# Patient Record
Sex: Female | Born: 1935 | Race: White | Hispanic: No | Marital: Married | Smoking: Former smoker
Health system: Southern US, Community
[De-identification: ages and names within clinical notes are randomized; demographics above are authoritative.]

## PROBLEM LIST (undated history)

## (undated) DIAGNOSIS — E785 Hyperlipidemia, unspecified: Secondary | ICD-10-CM

## (undated) DIAGNOSIS — M48061 Spinal stenosis, lumbar region without neurogenic claudication: Secondary | ICD-10-CM

## (undated) DIAGNOSIS — I2510 Atherosclerotic heart disease of native coronary artery without angina pectoris: Secondary | ICD-10-CM

## (undated) DIAGNOSIS — M1990 Unspecified osteoarthritis, unspecified site: Secondary | ICD-10-CM

## (undated) DIAGNOSIS — I5022 Chronic systolic (congestive) heart failure: Secondary | ICD-10-CM

## (undated) DIAGNOSIS — Z950 Presence of cardiac pacemaker: Secondary | ICD-10-CM

## (undated) DIAGNOSIS — I428 Other cardiomyopathies: Secondary | ICD-10-CM

## (undated) DIAGNOSIS — I447 Left bundle-branch block, unspecified: Secondary | ICD-10-CM

## (undated) DIAGNOSIS — I10 Essential (primary) hypertension: Secondary | ICD-10-CM

## (undated) DIAGNOSIS — I429 Cardiomyopathy, unspecified: Secondary | ICD-10-CM

## (undated) HISTORY — PX: APPENDECTOMY: SHX54

## (undated) HISTORY — DX: Unspecified osteoarthritis, unspecified site: M19.90

## (undated) HISTORY — PX: AMPUTATION: SHX166

## (undated) HISTORY — PX: PACEMAKER INSERTION: SHX728

## (undated) HISTORY — DX: Atherosclerotic heart disease of native coronary artery without angina pectoris: I25.10

## (undated) HISTORY — DX: Cardiomyopathy, unspecified: I42.9

## (undated) HISTORY — DX: Hyperlipidemia, unspecified: E78.5

## (undated) HISTORY — DX: Spinal stenosis, lumbar region without neurogenic claudication: M48.061

## (undated) HISTORY — DX: Essential (primary) hypertension: I10

## (undated) HISTORY — DX: Left bundle-branch block, unspecified: I44.7

## (undated) HISTORY — PX: KIDNEY SURGERY: SHX687

## (undated) HISTORY — DX: Chronic systolic (congestive) heart failure: I50.22

## (undated) HISTORY — DX: Other cardiomyopathies: I42.8

## (undated) HISTORY — PX: TONSILLECTOMY: SHX5217

## (undated) HISTORY — PX: CHOLECYSTECTOMY: SHX55

## (undated) HISTORY — DX: Presence of cardiac pacemaker: Z95.0

---

## 2001-05-01 ENCOUNTER — Encounter: Payer: Self-pay | Admitting: Cardiology

## 2001-05-01 ENCOUNTER — Inpatient Hospital Stay (HOSPITAL_COMMUNITY): Admission: EM | Admit: 2001-05-01 | Discharge: 2001-05-04 | Payer: Self-pay | Admitting: Emergency Medicine

## 2001-09-09 ENCOUNTER — Other Ambulatory Visit: Admission: RE | Admit: 2001-09-09 | Discharge: 2001-09-09 | Payer: Self-pay | Admitting: Internal Medicine

## 2002-12-31 ENCOUNTER — Emergency Department (HOSPITAL_COMMUNITY): Admission: EM | Admit: 2002-12-31 | Discharge: 2002-12-31 | Payer: Self-pay

## 2003-04-18 ENCOUNTER — Emergency Department (HOSPITAL_COMMUNITY): Admission: EM | Admit: 2003-04-18 | Discharge: 2003-04-18 | Payer: Self-pay

## 2003-09-26 ENCOUNTER — Emergency Department (HOSPITAL_COMMUNITY): Admission: EM | Admit: 2003-09-26 | Discharge: 2003-09-26 | Payer: Self-pay | Admitting: *Deleted

## 2003-12-16 ENCOUNTER — Encounter: Admission: RE | Admit: 2003-12-16 | Discharge: 2003-12-16 | Payer: Self-pay | Admitting: Internal Medicine

## 2004-01-04 ENCOUNTER — Inpatient Hospital Stay (HOSPITAL_COMMUNITY): Admission: AD | Admit: 2004-01-04 | Discharge: 2004-01-06 | Payer: Self-pay | Admitting: Internal Medicine

## 2004-02-02 HISTORY — PX: HIP FRACTURE SURGERY: SHX118

## 2004-02-20 ENCOUNTER — Other Ambulatory Visit: Payer: Self-pay

## 2004-02-24 ENCOUNTER — Inpatient Hospital Stay (HOSPITAL_COMMUNITY)
Admission: RE | Admit: 2004-02-24 | Discharge: 2004-03-01 | Payer: Self-pay | Admitting: Physical Medicine & Rehabilitation

## 2004-10-02 IMAGING — CR DG CHEST 2V
2 series · 2 of 2 positions shown · non-contrast
Comparison: 12/05/03.

CLINICAL DATA: Chest pain.  Status post pacemaker.
 TWO VIEW CHEST ? 01/06/04

[view not recorded (1 of 2)]
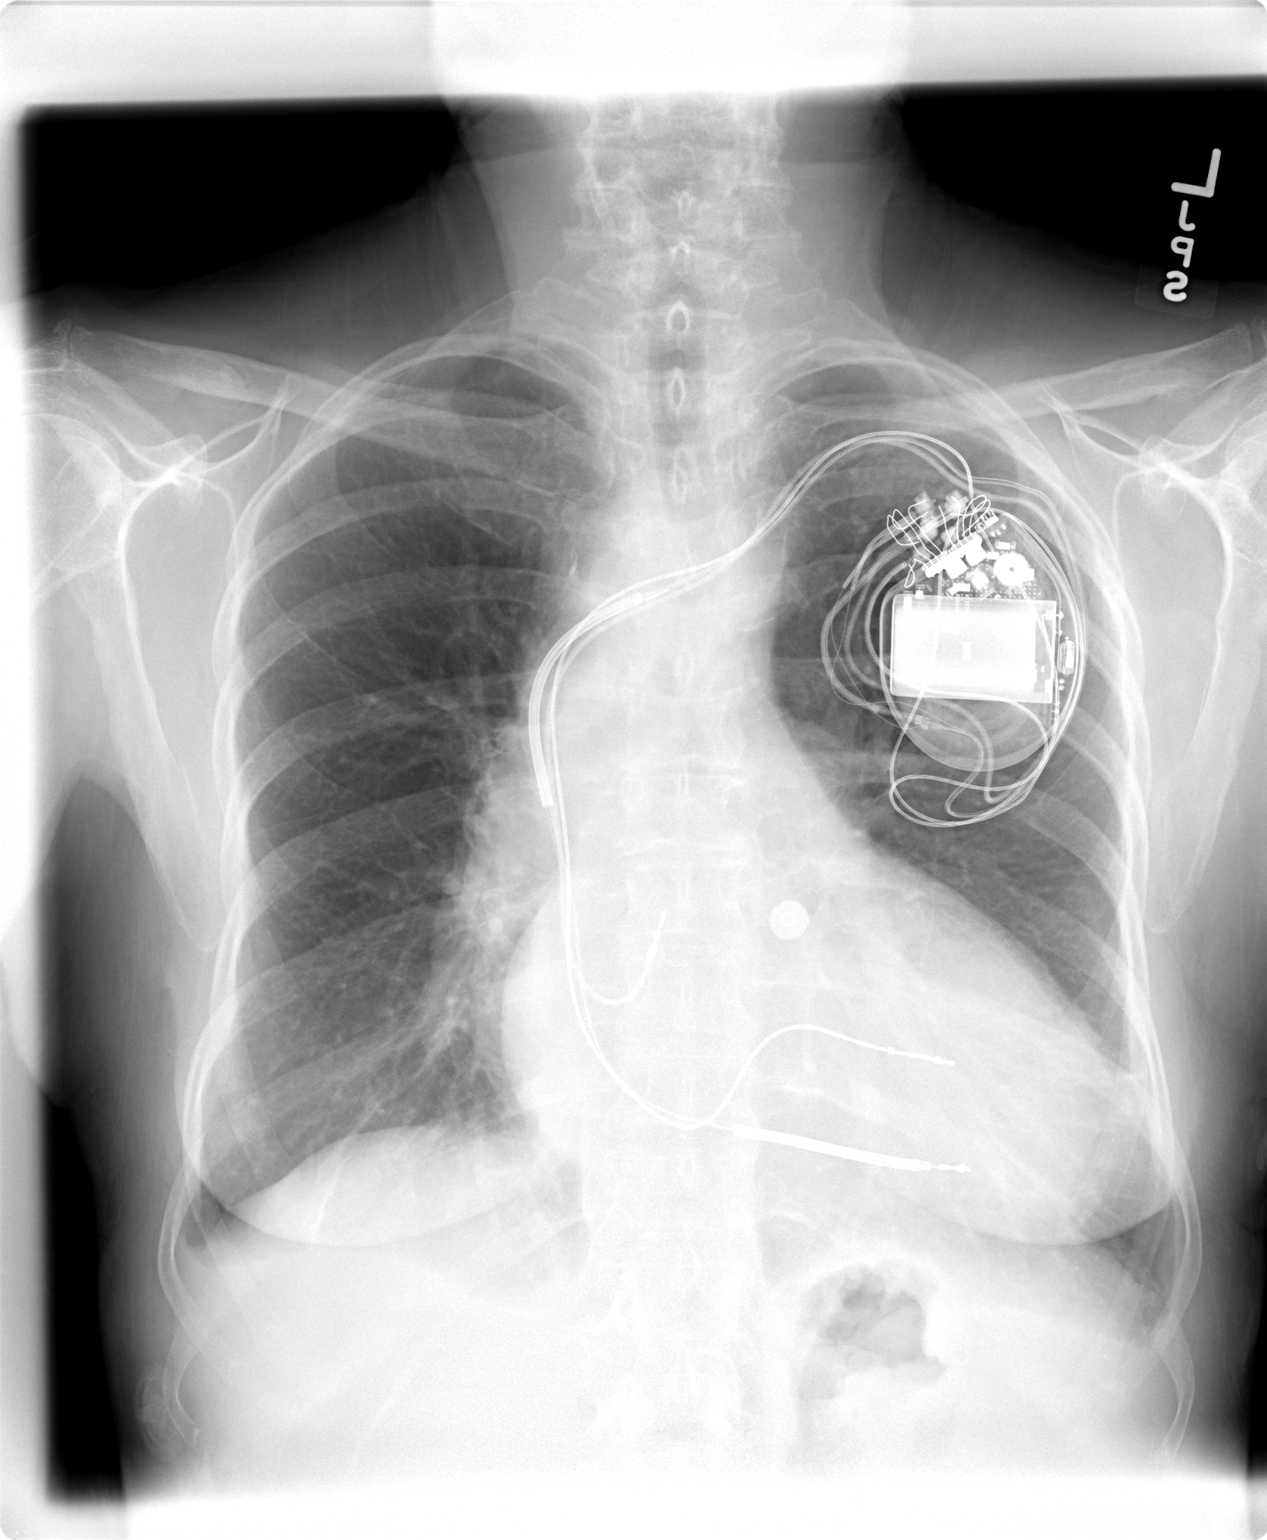

[view not recorded (2 of 2)]
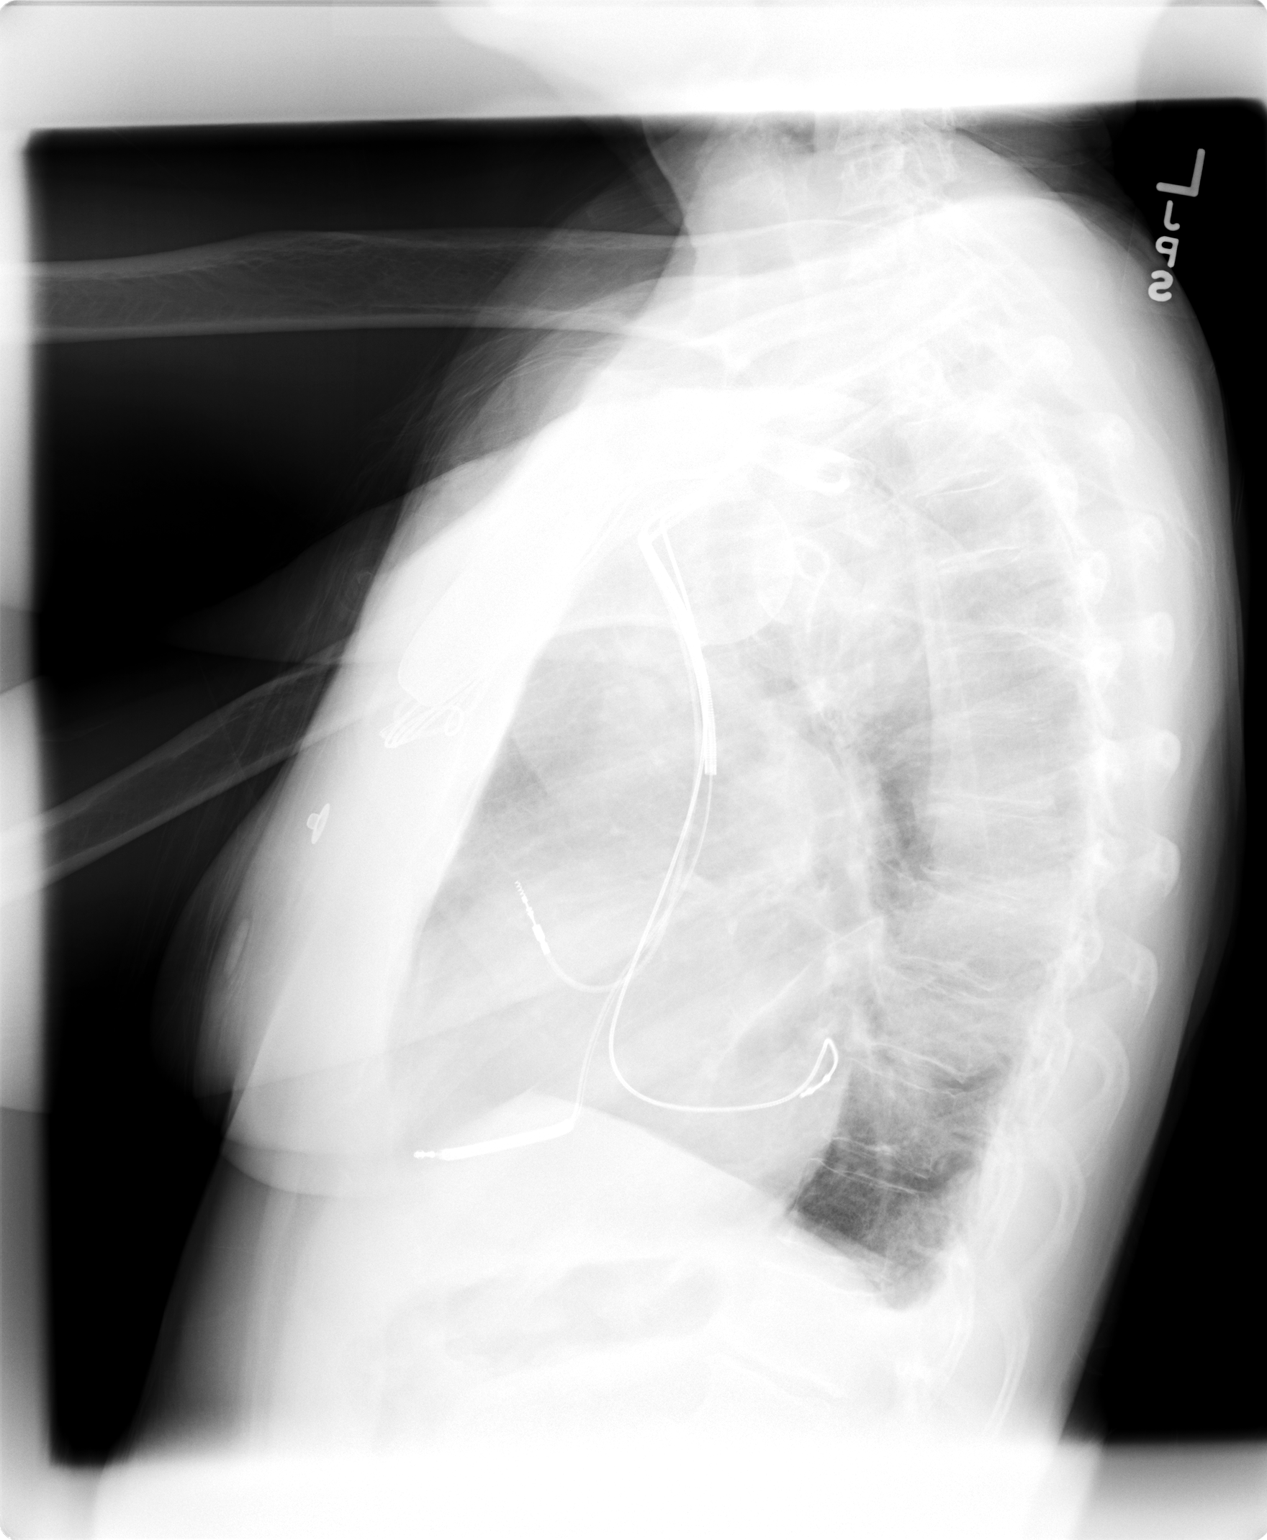

[2 of 2 positions shown; findings below may reference images not displayed]

FINDINGS: Cardiomegaly and left-sided AICD/pacemaker noted with leads overlying the right atrium, right ventricle and coronary sinus.  No evidence of pneumothorax.  Very small bilateral pleural effusions are identified.
IMPRESSION: Stable cardiomegaly and left AICD/pacer.
 Tiny bilateral pleural effusions.

## 2004-12-22 ENCOUNTER — Ambulatory Visit: Payer: Self-pay

## 2005-01-05 ENCOUNTER — Ambulatory Visit: Payer: Self-pay | Admitting: Internal Medicine

## 2005-04-11 ENCOUNTER — Ambulatory Visit: Payer: Self-pay | Admitting: Internal Medicine

## 2005-09-18 ENCOUNTER — Ambulatory Visit: Payer: Self-pay | Admitting: Internal Medicine

## 2005-10-27 ENCOUNTER — Ambulatory Visit: Payer: Self-pay | Admitting: Cardiology

## 2005-12-07 ENCOUNTER — Ambulatory Visit: Payer: Self-pay | Admitting: Internal Medicine

## 2005-12-20 ENCOUNTER — Encounter: Admission: RE | Admit: 2005-12-20 | Discharge: 2005-12-20 | Payer: Self-pay | Admitting: Orthopedic Surgery

## 2005-12-20 ENCOUNTER — Encounter: Payer: Self-pay | Admitting: Internal Medicine

## 2006-01-30 ENCOUNTER — Ambulatory Visit: Payer: Self-pay | Admitting: Internal Medicine

## 2006-03-20 ENCOUNTER — Ambulatory Visit: Payer: Self-pay | Admitting: Internal Medicine

## 2006-04-27 ENCOUNTER — Ambulatory Visit: Payer: Self-pay | Admitting: Cardiology

## 2006-05-04 ENCOUNTER — Ambulatory Visit: Payer: Self-pay

## 2006-06-11 ENCOUNTER — Ambulatory Visit: Payer: Self-pay | Admitting: Cardiology

## 2006-06-21 ENCOUNTER — Ambulatory Visit: Payer: Self-pay | Admitting: Internal Medicine

## 2006-07-27 ENCOUNTER — Ambulatory Visit (HOSPITAL_COMMUNITY): Admission: RE | Admit: 2006-07-27 | Discharge: 2006-07-28 | Payer: Self-pay | Admitting: Orthopedic Surgery

## 2006-09-16 IMAGING — CT CT L SPINE W/ CM
3 of 10 series · 13 of 33 positions shown, 16 images · IV contrast (omnipaque)
Comparison: none

CLINICAL DATA: Low back pain.  Bilateral hip and leg pain.  
LUMBAR MYELOGRAM:
TECHNIQUE: Following informed consent including the risks of pain, infection, bleeding, neurological deficit and headache, sterile preparation of the back and adequate local anesthesia, lumbar puncture was performed by myself at L5-S1 from a left paramedian approach.  Fluid was clear and colorless.  A 22 gauge needle was employed.  I injected 15 cc of Omnipaque 180 into the subarachnoid space.
TECHNIQUE: Multidetector CT imaging of the lumbar spine was performed after intrathecal injection of contrast.  Multiplanar CT image reconstructions were also generated.

[Series 4: recon 3: l spine · axial · 0.27mm/px · z∈[-208,-75]mm · 5 of 332 slices shown, 7 images]
[im 56/332  soft-tissue]
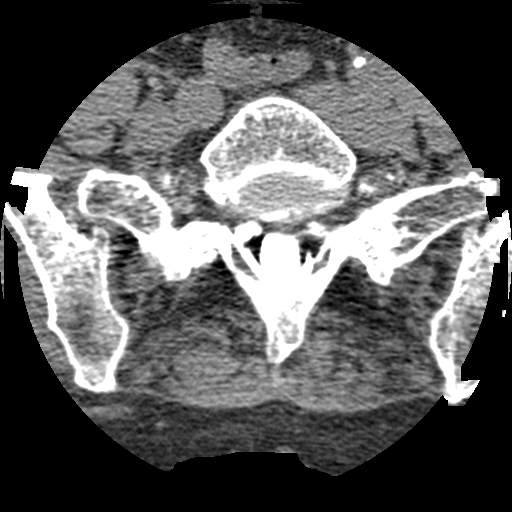
[im 56/332  bone]
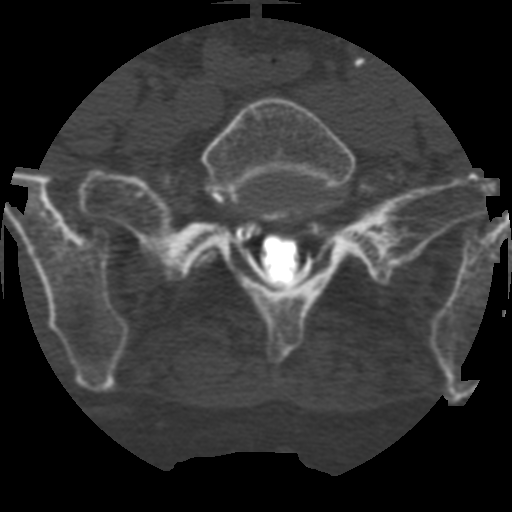
[im 111/332  bone]
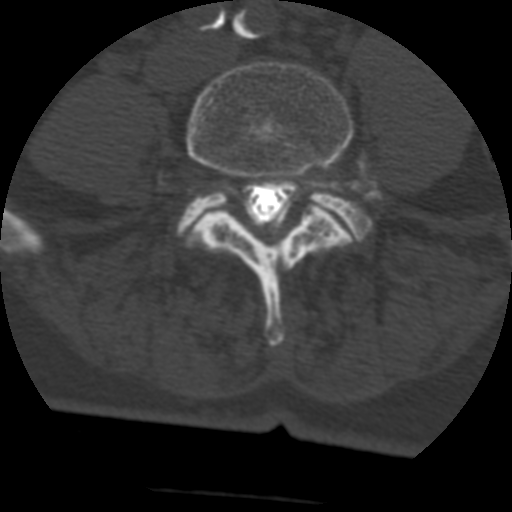
[im 166/332  bone]
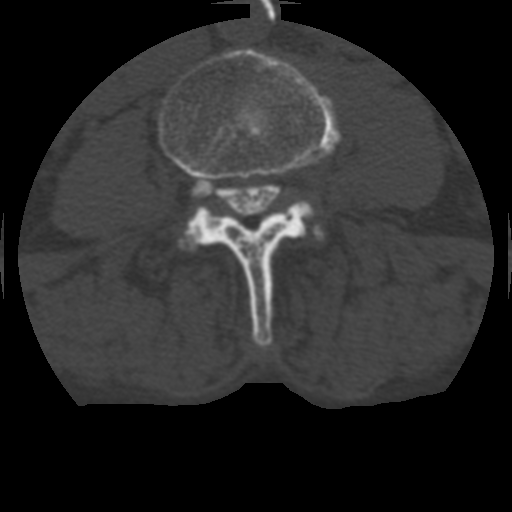
[im 221/332  bone]
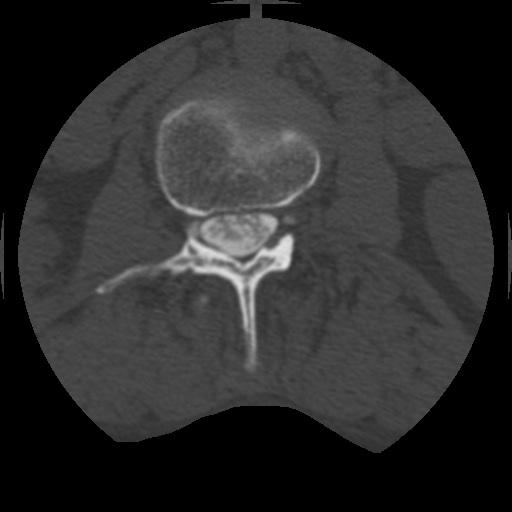
[im 276/332  soft-tissue]
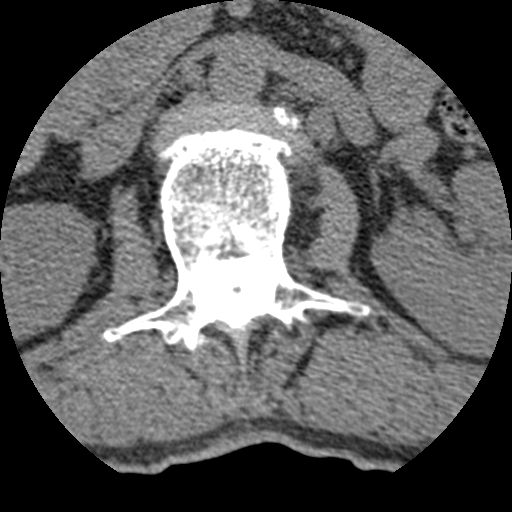
[im 276/332  bone]
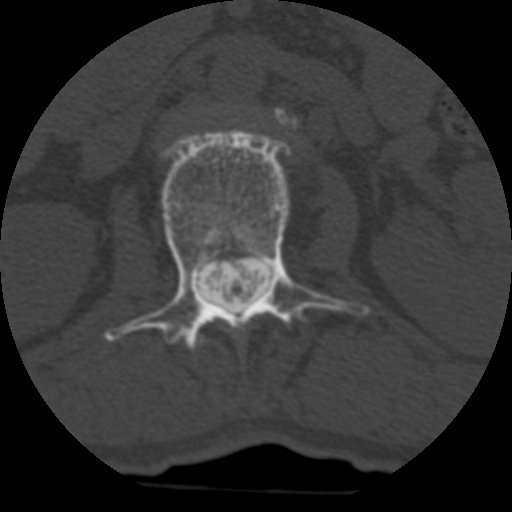

[Series 400: reformatted · sagittal · 0.40mm/px · 5 of 40 slices shown, 6 images (1 of 2)]
[im 14/40  bone]
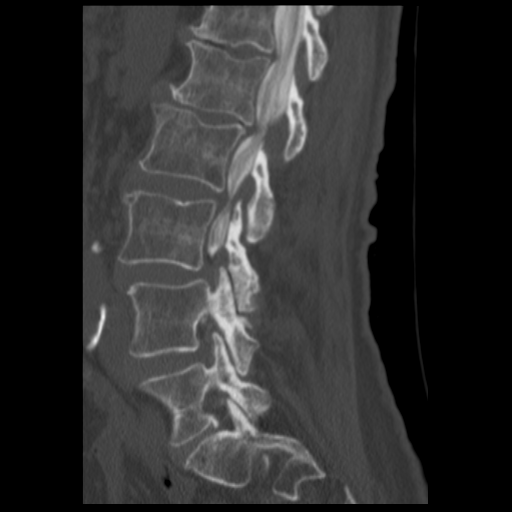
[im 17/40  bone]
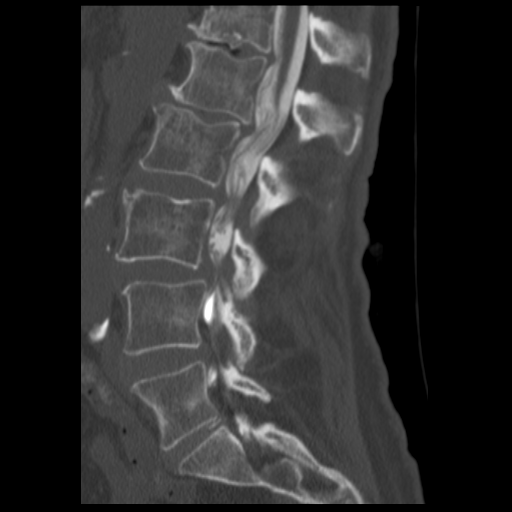
[im 20/40  soft-tissue]
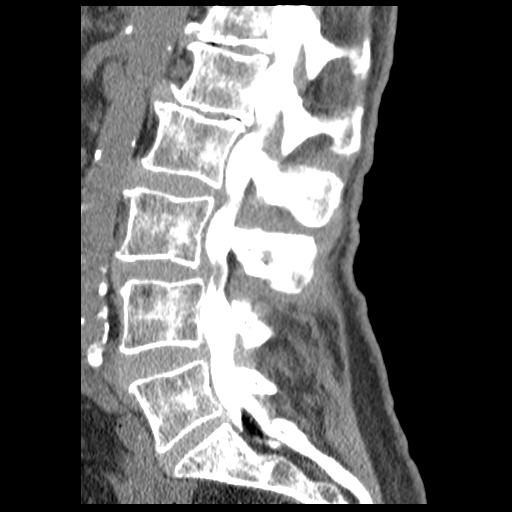
[im 20/40  bone]
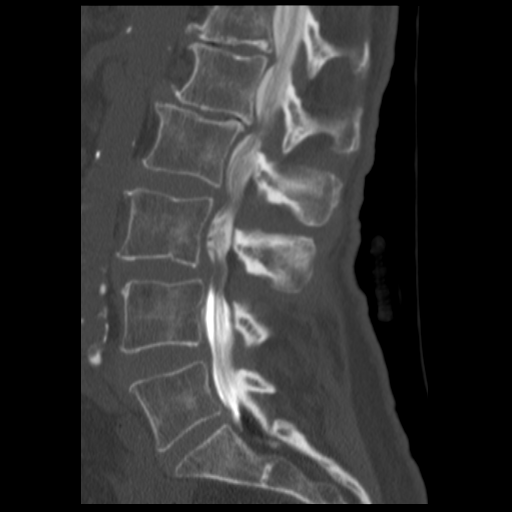
[im 23/40  bone]
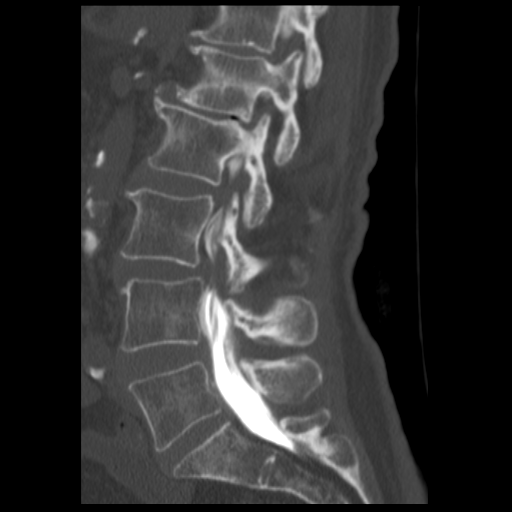
[im 27/40  bone]
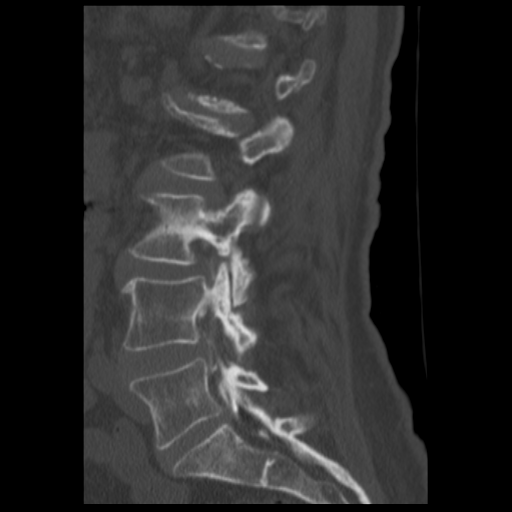

[Series 401: reformatted · coronal · 0.40mm/px · 3 of 40 slices shown (2 of 2)]
[im 8/40  bone]
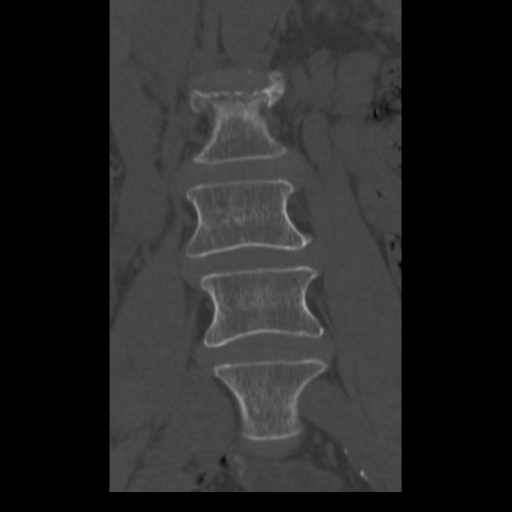
[im 16/40  bone]
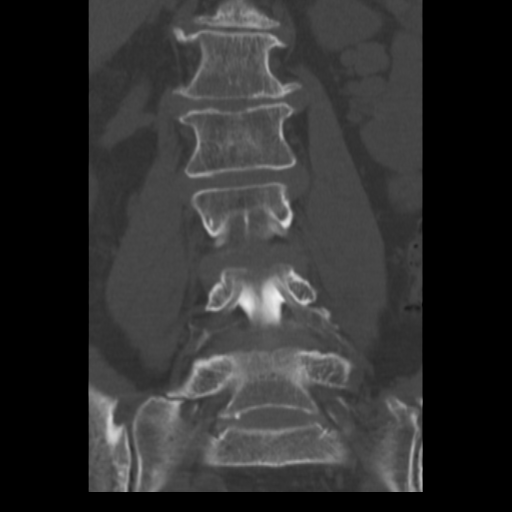
[im 24/40  bone]
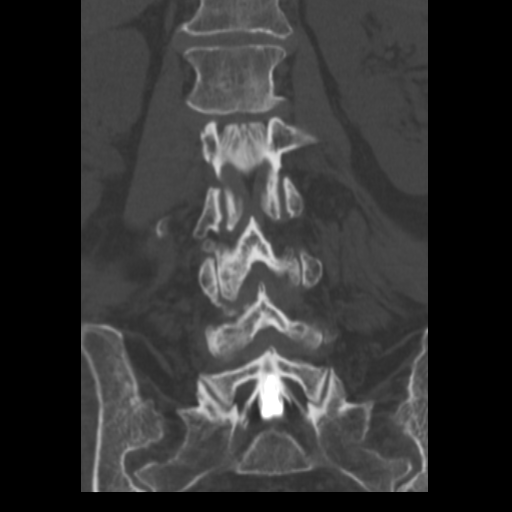

[13 of 33 positions shown; findings below may reference images not displayed]

FINDINGS: AP, lateral and oblique views show significant multilevel stenosis at L1-2, L2-3, L3-4, and L4-5 with waist-like narrowing and bilateral nerve root cut off at each level.  The L5-S1 level is not as severely affected; the S-1 nerve roots appear to fill out fairly normally.  The patient could not cooperate for standing flexion and extension films as she was too weak.  Lateral film with her recumbent did demonstrate 5 mm of anterolisthesis of L-3 on L-4.
IMPRESSION: Multilevel stenosis.
CT LUMBAR SPINE WITH CONTRAST (POST-MYELOGRAM):
FINDINGS: Mild vascular calcification is noted in the aorta without aneurysmal dilatation. 
T12-L1:  Tiny left paracentral protrusion.  No neural encroachment.
L1-2:  Broad based central disc protrusion.  Mild facet arthropathy.  Bilateral L-2 nerve root encroachment. 
L2-3:  Severe multifactorial spinal stenosis secondary to posterior element hypertrophy, short pedicles, and broad based central disc protrusion with fairly severe compression of the thecal sac as well as bilateral L-3 nerve root compression.  There is some foraminal narrowing but no definite L-2 nerve root compromise.
L3-4:  Approximately 5 mm degenerative slip of L-3 forward on L-4.  Advanced posterior element  hypertrophy and short pedicles are present along with a central disc protrusion.  Bilateral L-3 and bilateral L-4 nerve root encroachment are present. 
L4-5:  Mild bilateral L-5 nerve root encroachment due to broad based protrusion and moderate facet disease. 
L5-S1:  Small central protrusion.  Mild facet hypertrophy.  No neural encroachment.
IMPRESSION: 1.  Severe spinal stenosis at L3-4 secondary to 5 mm of degenerative slip, marked posterior element hypertrophy and broad based central disc protrusion along with short pedicles; bilateral L-3 and bilateral L-4 nerve root encroachment are present. 
2.  Similar but less severe stenosis at L2-3 secondary to central disc protrusion, posterior element hypertrophy, and short pedicles with bilateral L-3 nerve root encroachment. 
3.  Advanced disc space narrowing L1-2 with small central protrusion.  Bilateral L-2 nerve root compromise is seen. 
4.  Relatively mild changes at L4-5 with broad based central disc protrusion and facet hypertrophy effacing both L-5 nerve roots.

## 2006-09-16 IMAGING — RF DG MYELOGRAM LUMBAR
13 of 19 series · 13 of 19 positions shown · IV contrast (omnipaque)
Comparison: none

CLINICAL DATA: Low back pain.  Bilateral hip and leg pain.  
LUMBAR MYELOGRAM:
TECHNIQUE: Following informed consent including the risks of pain, infection, bleeding, neurological deficit and headache, sterile preparation of the back and adequate local anesthesia, lumbar puncture was performed by myself at L5-S1 from a left paramedian approach.  Fluid was clear and colorless.  A 22 gauge needle was employed.  I injected 15 cc of Omnipaque 180 into the subarachnoid space.
TECHNIQUE: Multidetector CT imaging of the lumbar spine was performed after intrathecal injection of contrast.  Multiplanar CT image reconstructions were also generated.

[Series 1: myelogram  white · 1 of 1 slices shown (1 of 13)]
[im 1/1]
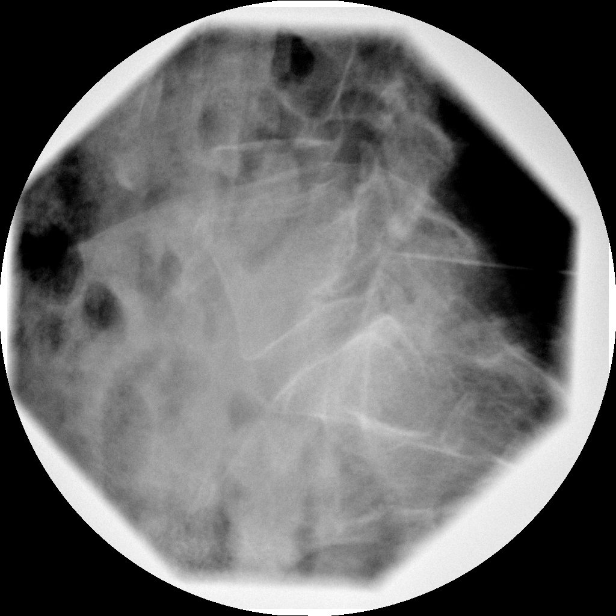

[Series 3: myelogram  white · 1 of 1 slices shown (2 of 13)]
[im 1/1]
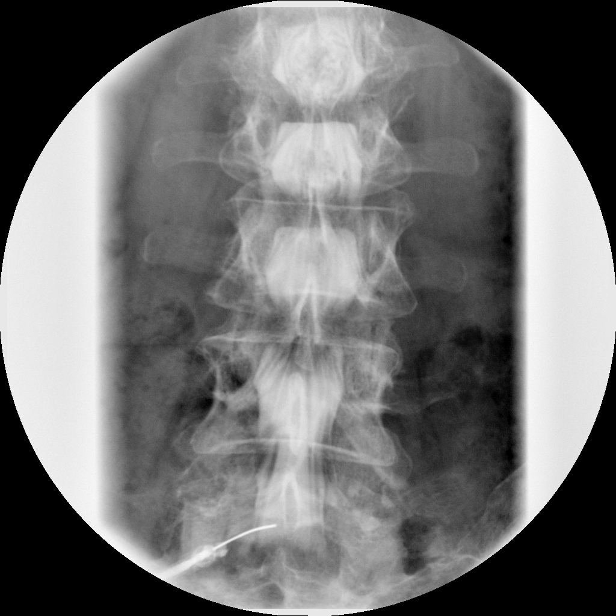

[Series 4: myelogram  white · 1 of 1 slices shown (3 of 13)]
[im 1/1]
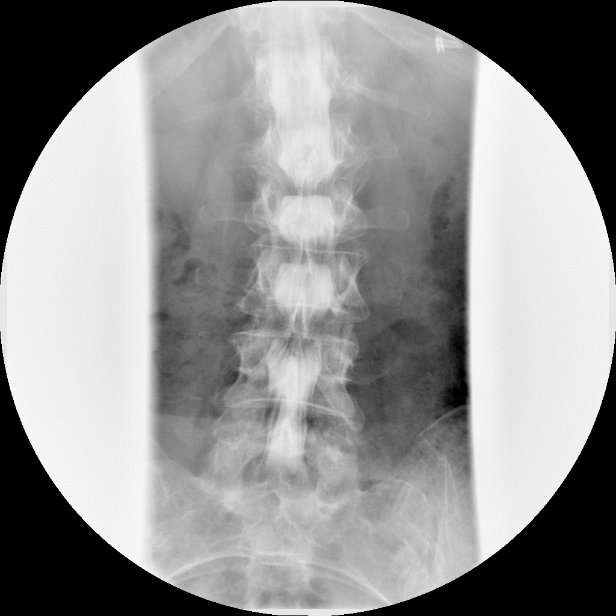

[Series 6: myelogram  white · 1 of 1 slices shown (4 of 13)]
[im 1/1]
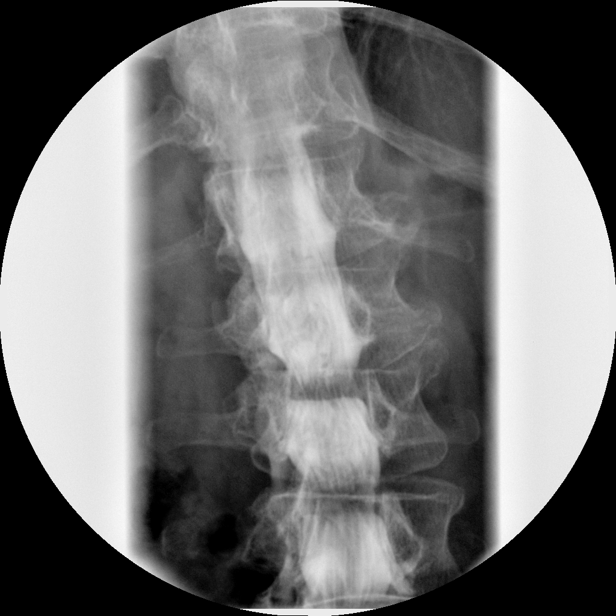

[Series 7: myelogram  white · 1 of 1 slices shown (5 of 13)]
[im 1/1]
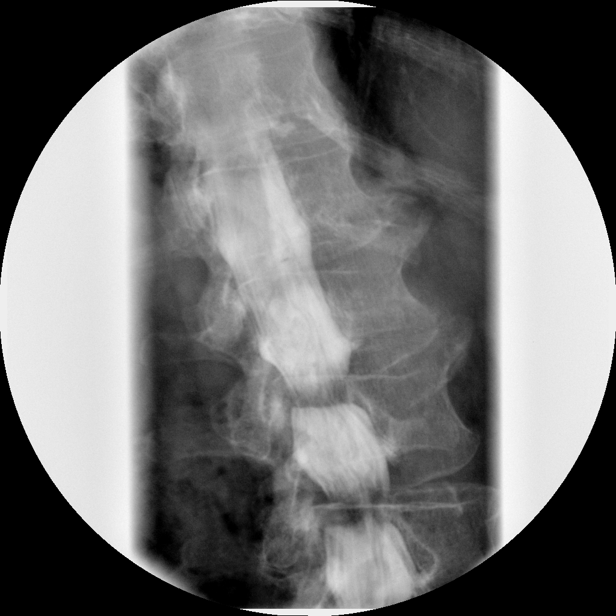

[Series 9: myelogram  white · 1 of 1 slices shown (6 of 13)]
[im 1/1]
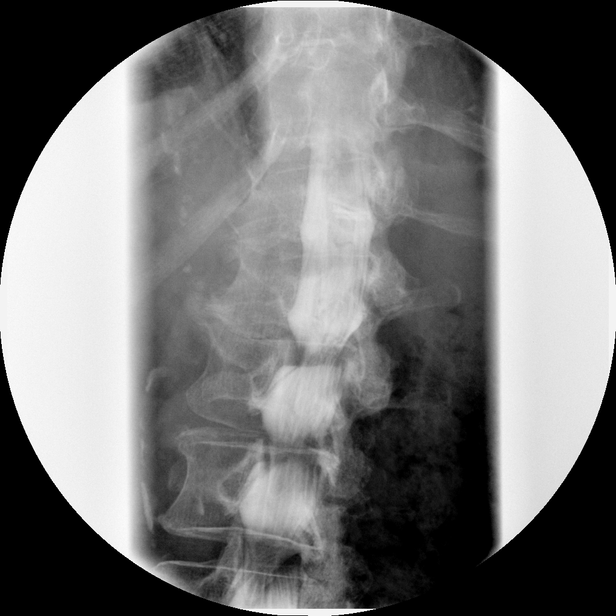

[Series 10: myelogram  white · 1 of 1 slices shown (7 of 13)]
[im 1/1]
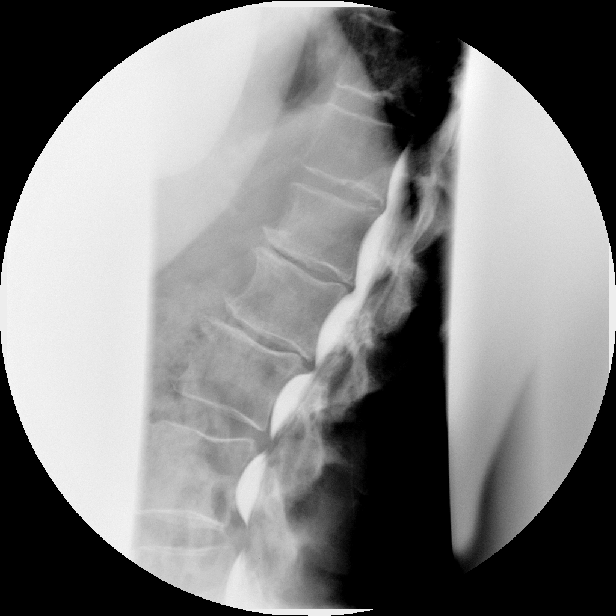

[Series 11: myelogram  white · 1 of 1 slices shown (8 of 13)]
[im 1/1]
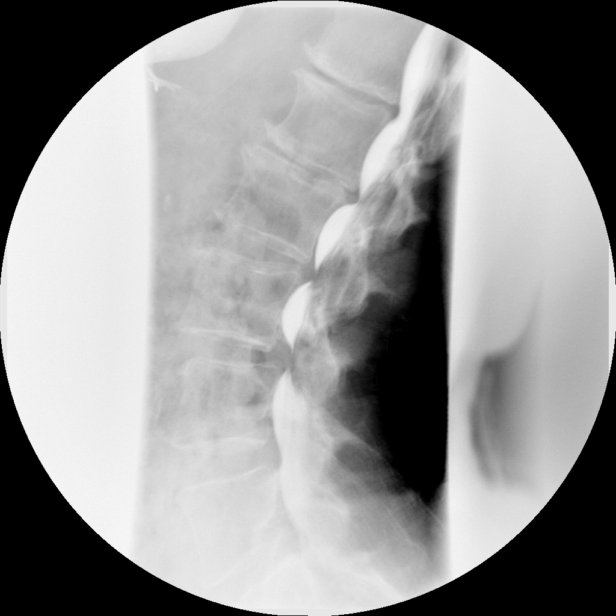

[Series 13: myelogram  white · 1 of 1 slices shown (9 of 13)]
[im 1/1]
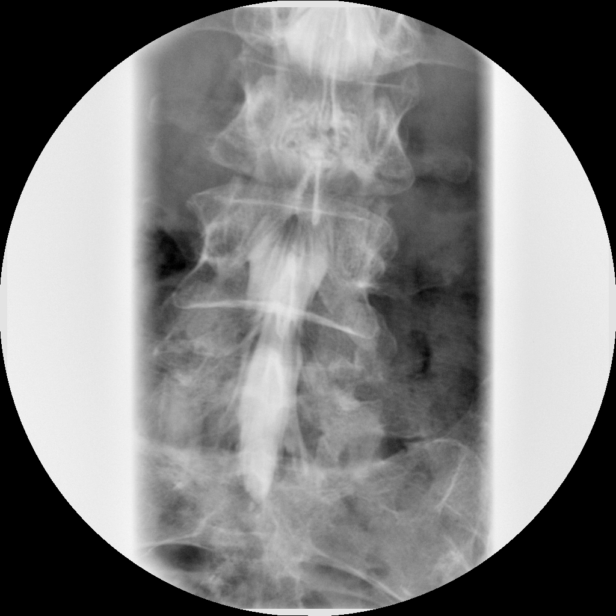

[Series 14: myelogram  white · 1 of 1 slices shown (10 of 13)]
[im 1/1]
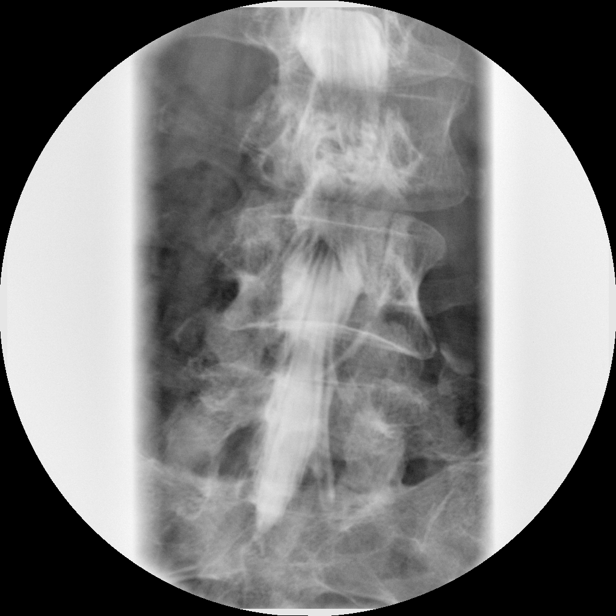

[Series 16: myelogram  white · 1 of 1 slices shown (11 of 13)]
[im 1/1]
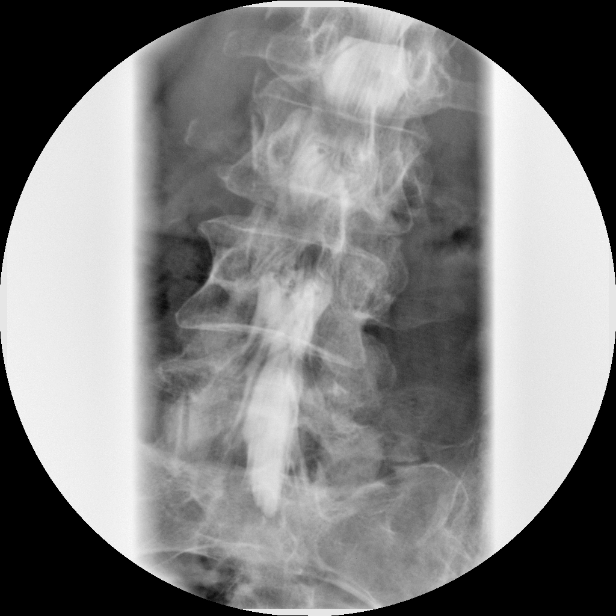

[Series 17: myelogram  white · 1 of 1 slices shown (12 of 13)]
[im 1/1]
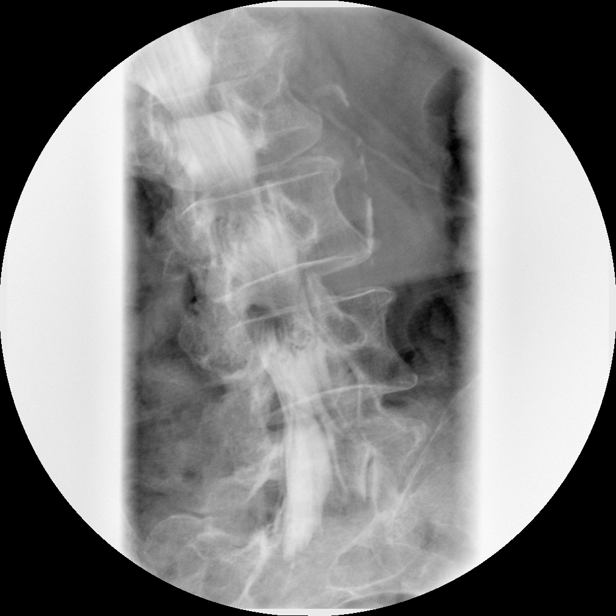

[Series 19: myelogram  white · 1 of 1 slices shown (13 of 13)]
[im 1/1]
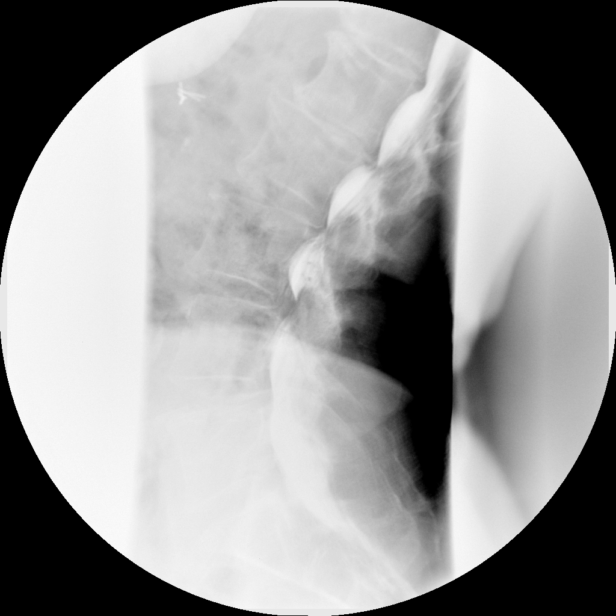

[13 of 19 positions shown; findings below may reference images not displayed]

FINDINGS: AP, lateral and oblique views show significant multilevel stenosis at L1-2, L2-3, L3-4, and L4-5 with waist-like narrowing and bilateral nerve root cut off at each level.  The L5-S1 level is not as severely affected; the S-1 nerve roots appear to fill out fairly normally.  The patient could not cooperate for standing flexion and extension films as she was too weak.  Lateral film with her recumbent did demonstrate 5 mm of anterolisthesis of L-3 on L-4.
IMPRESSION: Multilevel stenosis.
CT LUMBAR SPINE WITH CONTRAST (POST-MYELOGRAM):
FINDINGS: Mild vascular calcification is noted in the aorta without aneurysmal dilatation. 
T12-L1:  Tiny left paracentral protrusion.  No neural encroachment.
L1-2:  Broad based central disc protrusion.  Mild facet arthropathy.  Bilateral L-2 nerve root encroachment. 
L2-3:  Severe multifactorial spinal stenosis secondary to posterior element hypertrophy, short pedicles, and broad based central disc protrusion with fairly severe compression of the thecal sac as well as bilateral L-3 nerve root compression.  There is some foraminal narrowing but no definite L-2 nerve root compromise.
L3-4:  Approximately 5 mm degenerative slip of L-3 forward on L-4.  Advanced posterior element  hypertrophy and short pedicles are present along with a central disc protrusion.  Bilateral L-3 and bilateral L-4 nerve root encroachment are present. 
L4-5:  Mild bilateral L-5 nerve root encroachment due to broad based protrusion and moderate facet disease. 
L5-S1:  Small central protrusion.  Mild facet hypertrophy.  No neural encroachment.
IMPRESSION: 1.  Severe spinal stenosis at L3-4 secondary to 5 mm of degenerative slip, marked posterior element hypertrophy and broad based central disc protrusion along with short pedicles; bilateral L-3 and bilateral L-4 nerve root encroachment are present. 
2.  Similar but less severe stenosis at L2-3 secondary to central disc protrusion, posterior element hypertrophy, and short pedicles with bilateral L-3 nerve root encroachment. 
3.  Advanced disc space narrowing L1-2 with small central protrusion.  Bilateral L-2 nerve root compromise is seen. 
4.  Relatively mild changes at L4-5 with broad based central disc protrusion and facet hypertrophy effacing both L-5 nerve roots.

## 2006-09-20 ENCOUNTER — Ambulatory Visit: Payer: Self-pay | Admitting: Internal Medicine

## 2006-12-05 ENCOUNTER — Ambulatory Visit: Payer: Self-pay | Admitting: Cardiology

## 2006-12-20 ENCOUNTER — Ambulatory Visit: Payer: Self-pay | Admitting: Internal Medicine

## 2007-02-07 ENCOUNTER — Ambulatory Visit: Payer: Self-pay | Admitting: Internal Medicine

## 2007-03-07 ENCOUNTER — Ambulatory Visit: Payer: Self-pay

## 2007-04-21 IMAGING — CR DG CHEST 2V
2 series · 2 of 2 positions shown · non-contrast
Comparison: 01/06/2004

CLINICAL DATA: Preadmission chest x-ray for painful pins in the right hip.

[view not recorded (1 of 2)]
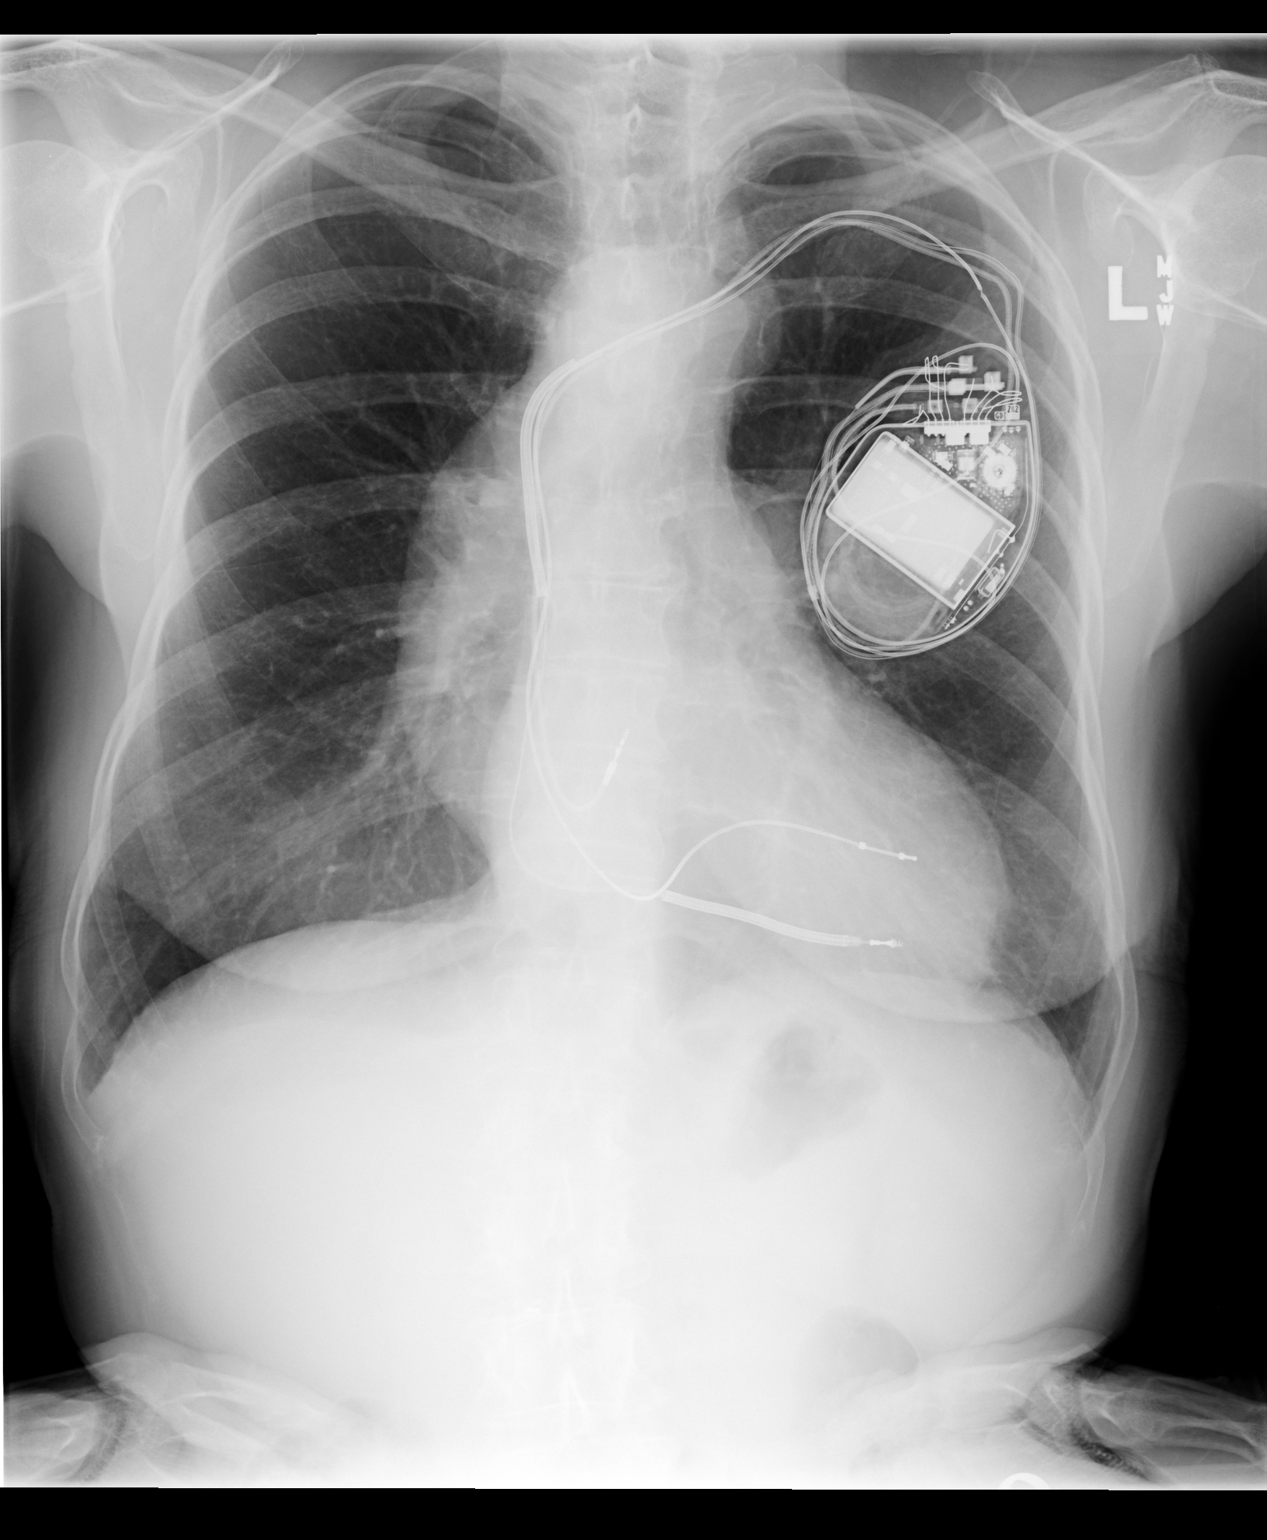

[view not recorded (2 of 2)]
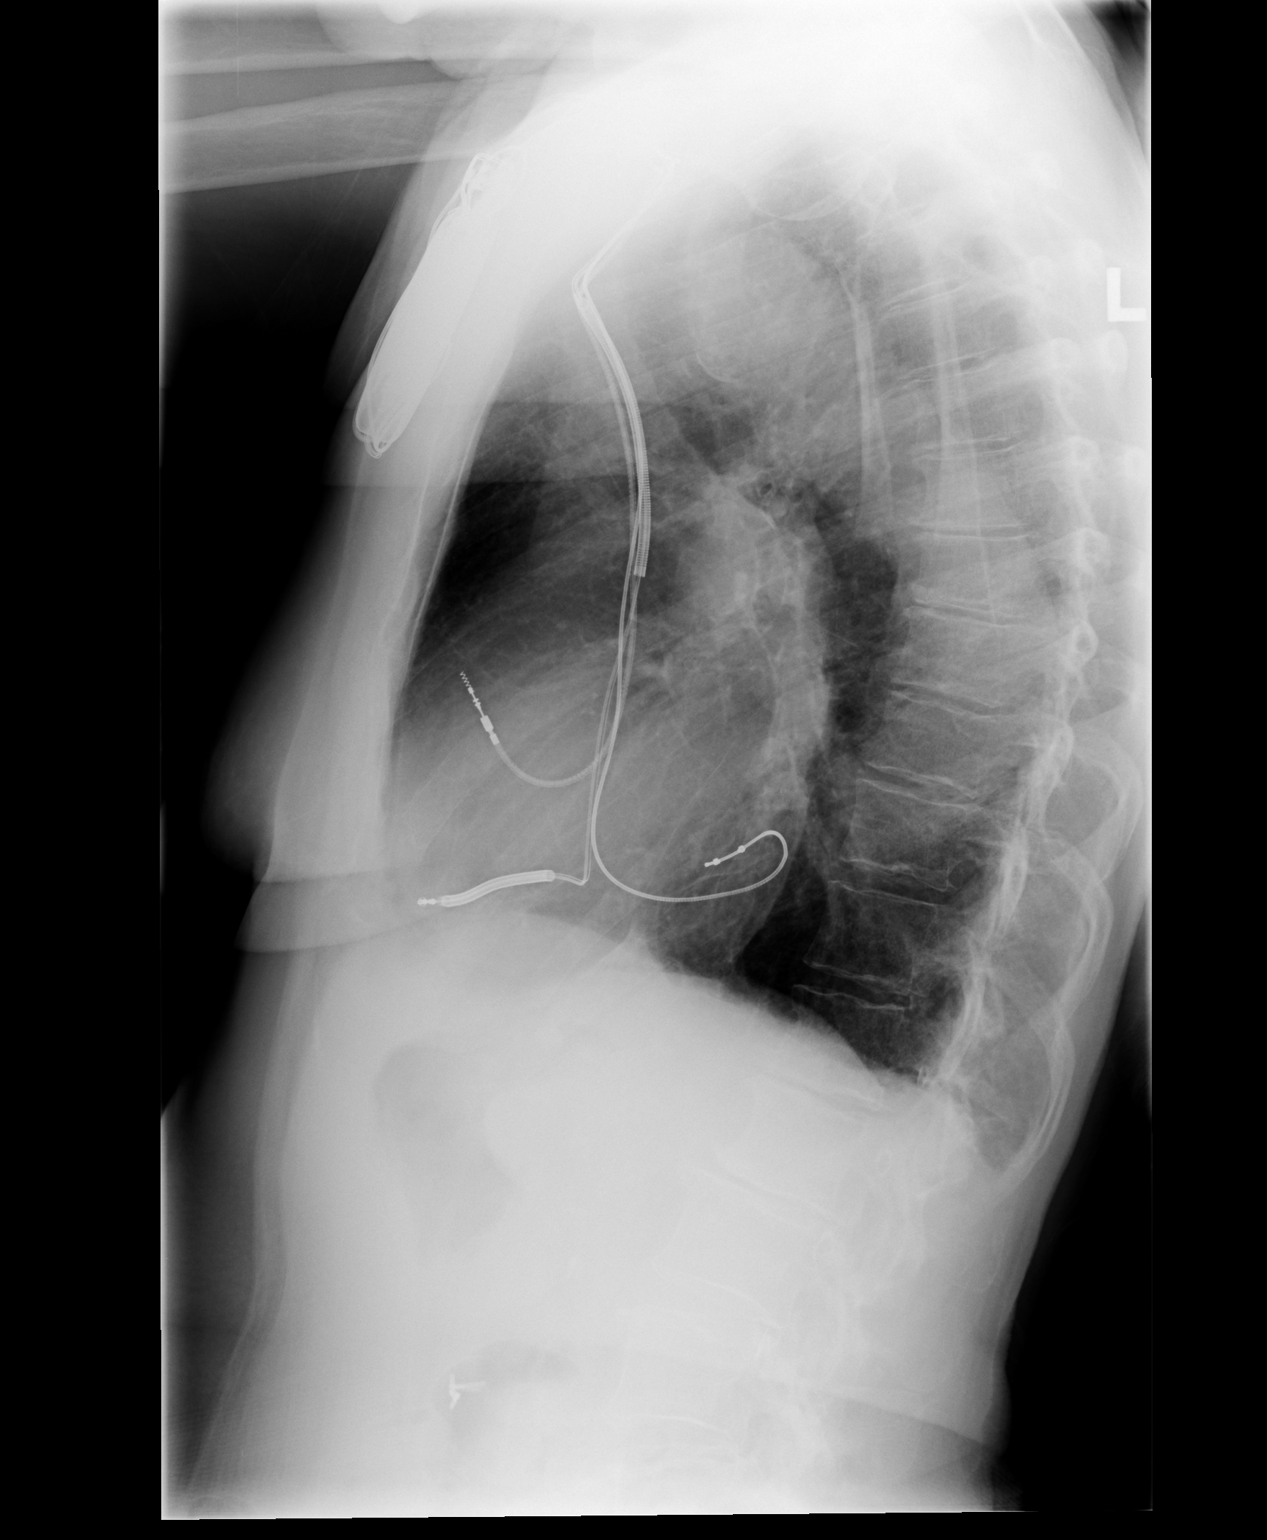

[2 of 2 positions shown; findings below may reference images not displayed]

CHEST - 2 VIEW:

The lungs are clear without focal infiltrate, edema or pleural effusion.  Since
the previous chest x-ray, the thoracic aorta has become markedly tortuous and
the descending aorta appears actually cross the midline and course into the
medial right hemithorax. The transverse aorta is not well seen and aneurysmal
dilation cannot be excluded. The left pacer/AICD remains in place.
IMPRESSION: Interval change in appearance of the cardiomediastinal contours with marked
tortuosity of the descending aorta which appears to track into the medial aspect
of the right hemithorax. Dedicated CT chest with contrast is recommended to
further evaluate and definitively exclude associated thoracic aortic aneurysm.

## 2007-04-23 IMAGING — RF DG HIP OPERATIVE*R*
1 series · 1 of 1 positions shown · non-contrast
Comparison: none

CLINICAL DATA: Removal of screws.   
RIGHT HIP ? 1 VIEW:

[Series 1: run · 1 of 1 slices shown]
[im 1/1]
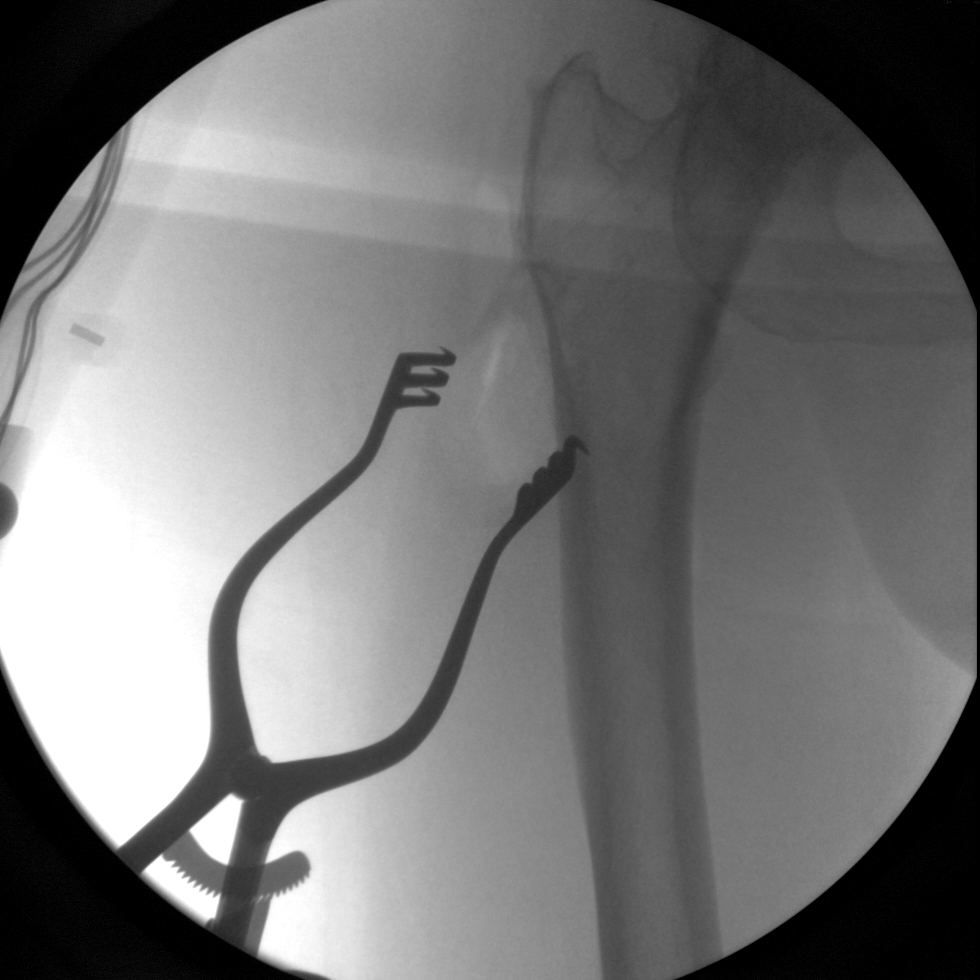

[1 of 1 positions shown; findings below may reference images not displayed]

FINDINGS: A single portable intraoperative view of the right hip is provided.  Retractors in place.  No retained hardware is identified.
IMPRESSION: As discussed above.

## 2007-04-23 IMAGING — CT CT CHEST W/ CM
2 of 3 series · 15 of 36 positions shown, 18 images · IV contrast (APPLIED)
Comparison: 07/25/2006

CLINICAL DATA: Abnormal mediastinal contours on chest x-ray.

CHEST CT WITH CONTRAST
TECHNIQUE: Multidetector CT imaging of the chest was performed following the
standard protocol during bolus administration of intravenous contrast.
Contrast:  100 cc Omnipaque 300

[Series 2: routine chest 5.0 st · axial · 0.62mm/px · z∈[-357,-87]mm · 12 of 64 slices shown, 15 images]
[im 5/64  mediastinal]
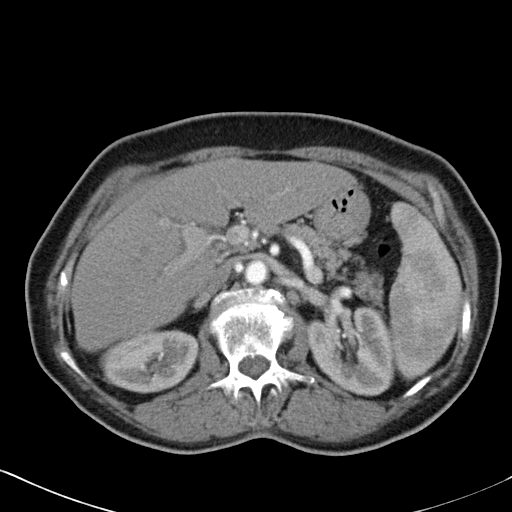
[im 5/64  lung]
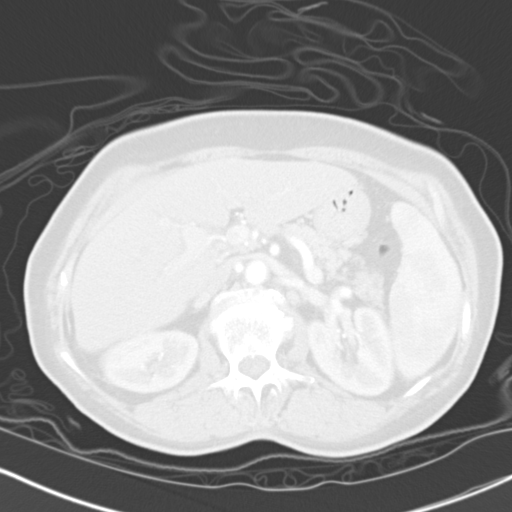
[im 10/64  lung]
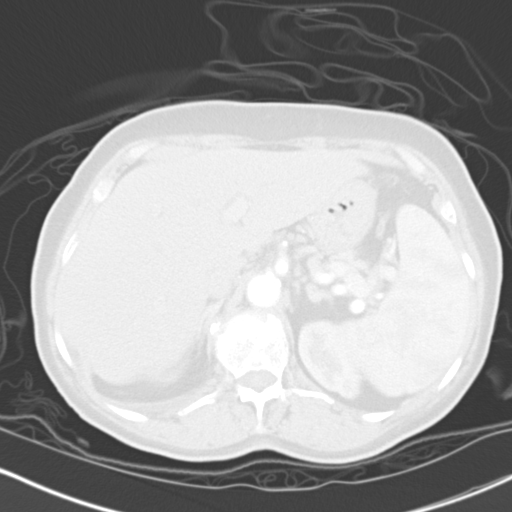
[im 15/64  lung]
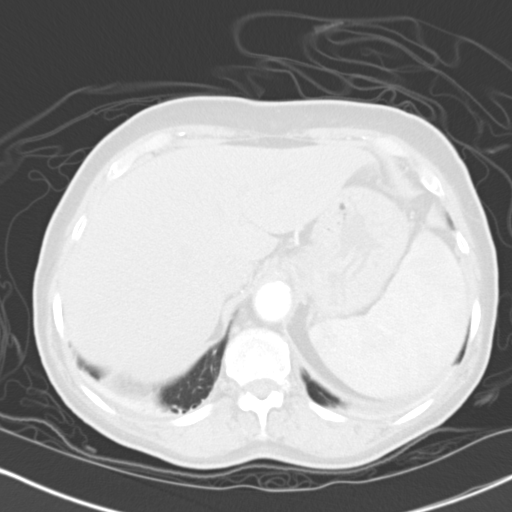
[im 19/64  lung]
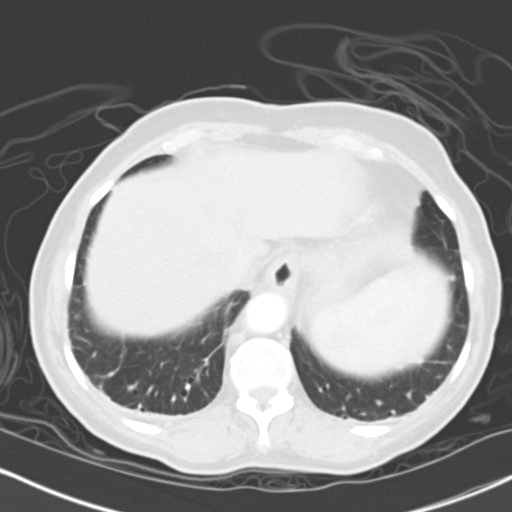
[im 24/64  mediastinal]
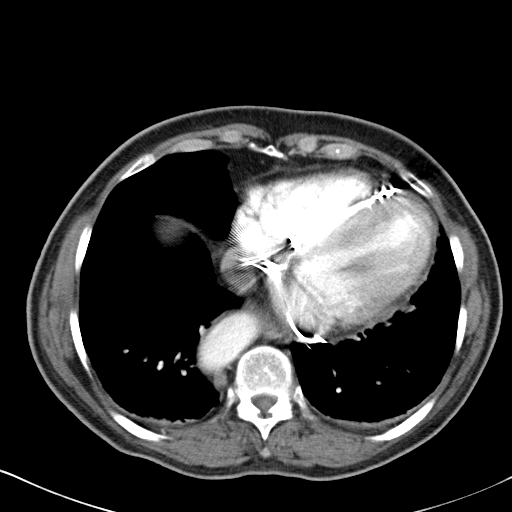
[im 24/64  lung]
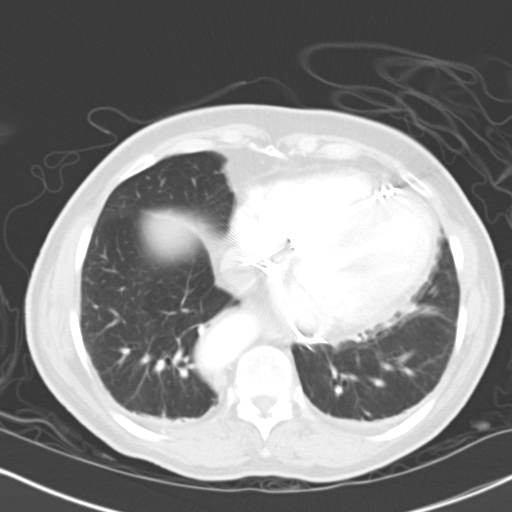
[im 29/64  lung]
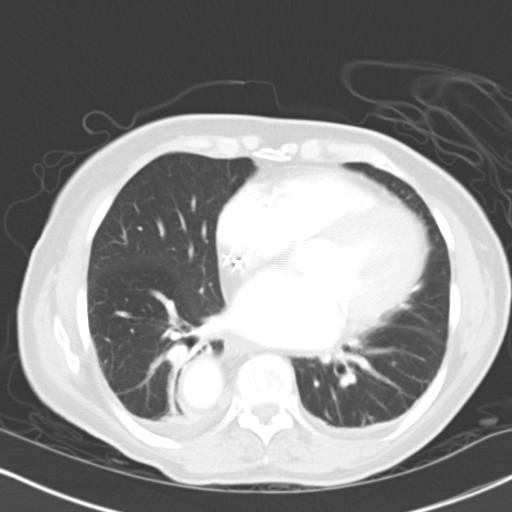
[im 36/64  lung]
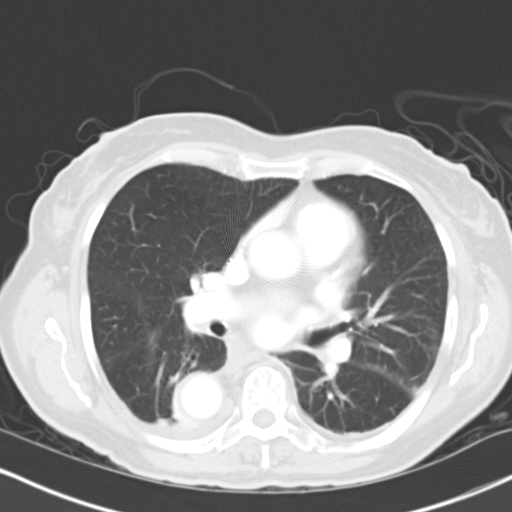
[im 40/64  lung]
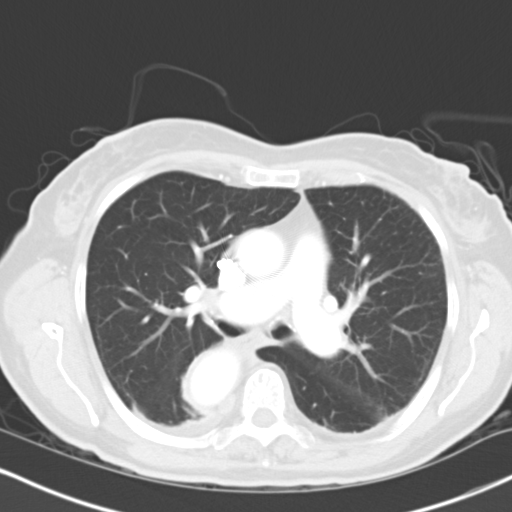
[im 45/64  mediastinal]
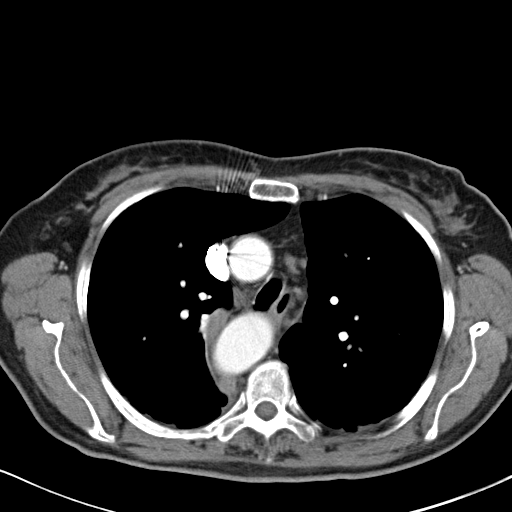
[im 45/64  lung]
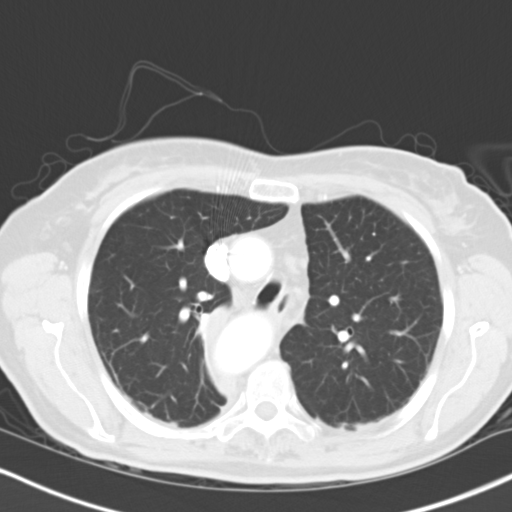
[im 50/64  lung]
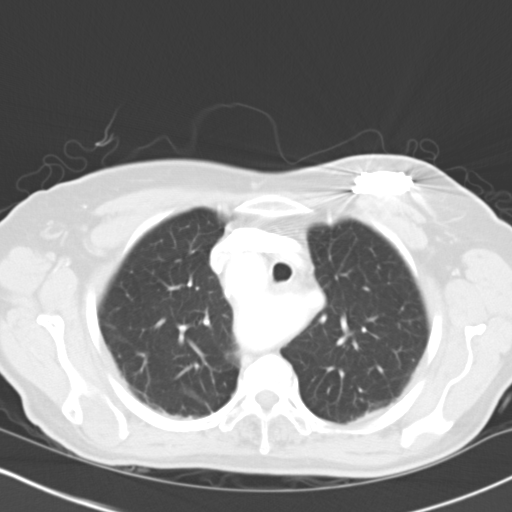
[im 54/64  lung]
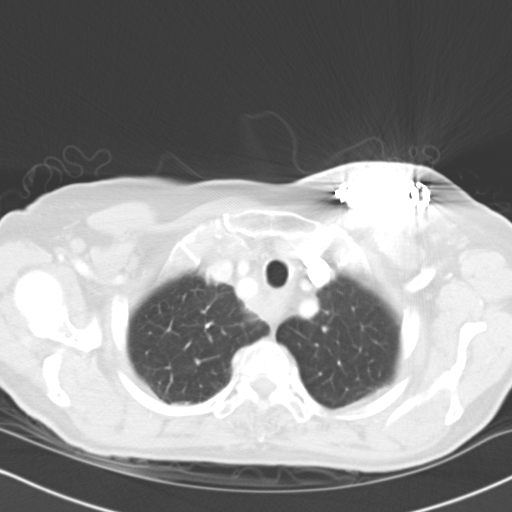
[im 59/64  lung]
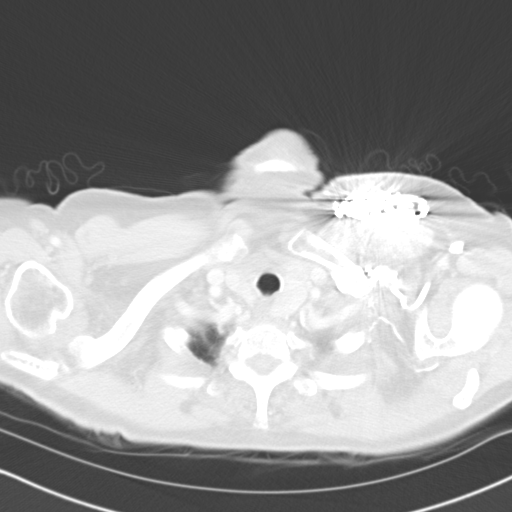

[Series 4: coronals · coronal · 0.64mm/px · 3 of 107 slices shown]
[im 22/107  lung]
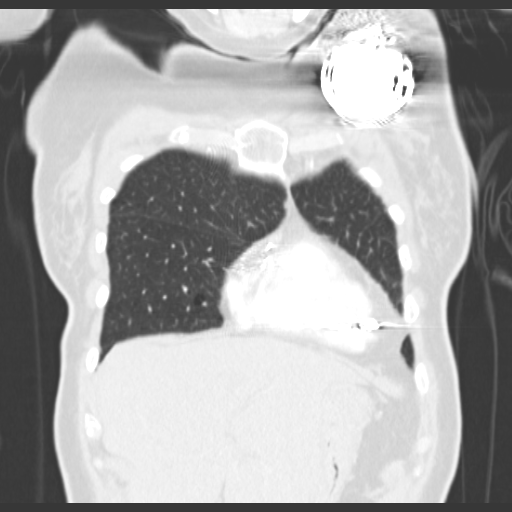
[im 43/107  lung]
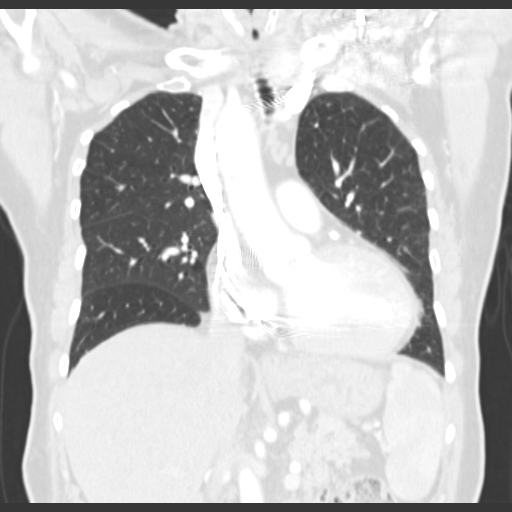
[im 64/107  lung]
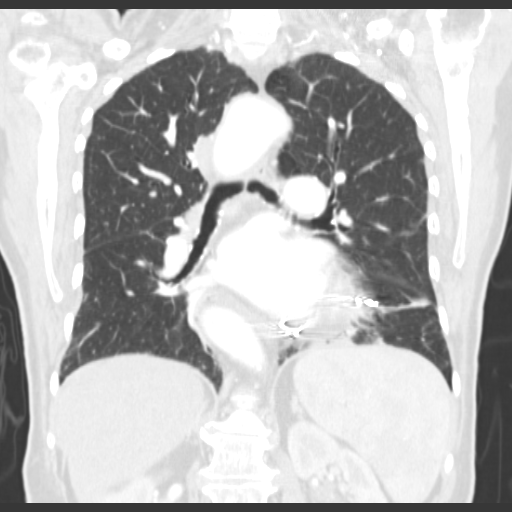

[15 of 36 positions shown; findings below may reference images not displayed]

FINDINGS: The abnormal mediastinal contours on chest x-ray are related to a
right aortic arch, with the descending aorta descending in the right side of the
chest. The proximal descending thoracic aorta measures maximally 3.0 cm,
slightly aneurysmal. There is an aberrant retroesophageal left subclavian artery
noted. There is mild compressive atelectasis within the right lung adjacent to
the descending thoracic aorta. Minimal and atelectasis in both lungs. Otherwise
no focal opacities in the lungs. No effusions.

There is cardiomegaly. Pacer is in place.

There is enlargement of the left lobe of the thyroid, which appears to contain 1
or 2 small low density lesions previously further evaluated with ultrasound.

Imaging into the upper abdomen is unremarkable.

IMPRESSION

Abnormal contours on chest x-ray represent a right-sided aortic arch. Descending
thoracic aortic descends in the right side of the chest. The proximal descending
thoracic aorta is slightly aneurysmal at 3 cm. There is an aberrant
retroesophageal left subclavian artery.

Cardiomegaly.

No atelectasis in the lungs. There is mild atelectasis adjacent to the aorta and
the right lower lobe.

Enlargement the left thyroid lobe with probable 1 or 2 small nodules. This could
be further evaluated with ultrasound.

## 2007-06-06 ENCOUNTER — Ambulatory Visit: Payer: Self-pay | Admitting: Internal Medicine

## 2007-09-05 ENCOUNTER — Ambulatory Visit: Payer: Self-pay | Admitting: Internal Medicine

## 2007-10-23 ENCOUNTER — Emergency Department: Payer: Self-pay | Admitting: Emergency Medicine

## 2007-10-23 ENCOUNTER — Other Ambulatory Visit: Payer: Self-pay

## 2007-12-10 ENCOUNTER — Ambulatory Visit: Payer: Self-pay | Admitting: Internal Medicine

## 2008-03-12 ENCOUNTER — Ambulatory Visit: Payer: Self-pay | Admitting: Internal Medicine

## 2008-05-28 ENCOUNTER — Encounter: Admission: RE | Admit: 2008-05-28 | Discharge: 2008-05-28 | Payer: Self-pay | Admitting: Orthopedic Surgery

## 2008-06-11 ENCOUNTER — Ambulatory Visit: Payer: Self-pay | Admitting: Internal Medicine

## 2008-07-19 IMAGING — CR DG CHEST 1V PORT
1 series · 1 of 1 positions shown · non-contrast
Comparison: none

REASON FOR EXAM: cp. trauma
COMMENTS:

PROCEDURE:     DXR - DXR PORTABLE CHEST SINGLE VIEW  - October 23, 2007  [DATE]
RESULT:     Comparison: No available comparison exam.

[view not recorded]
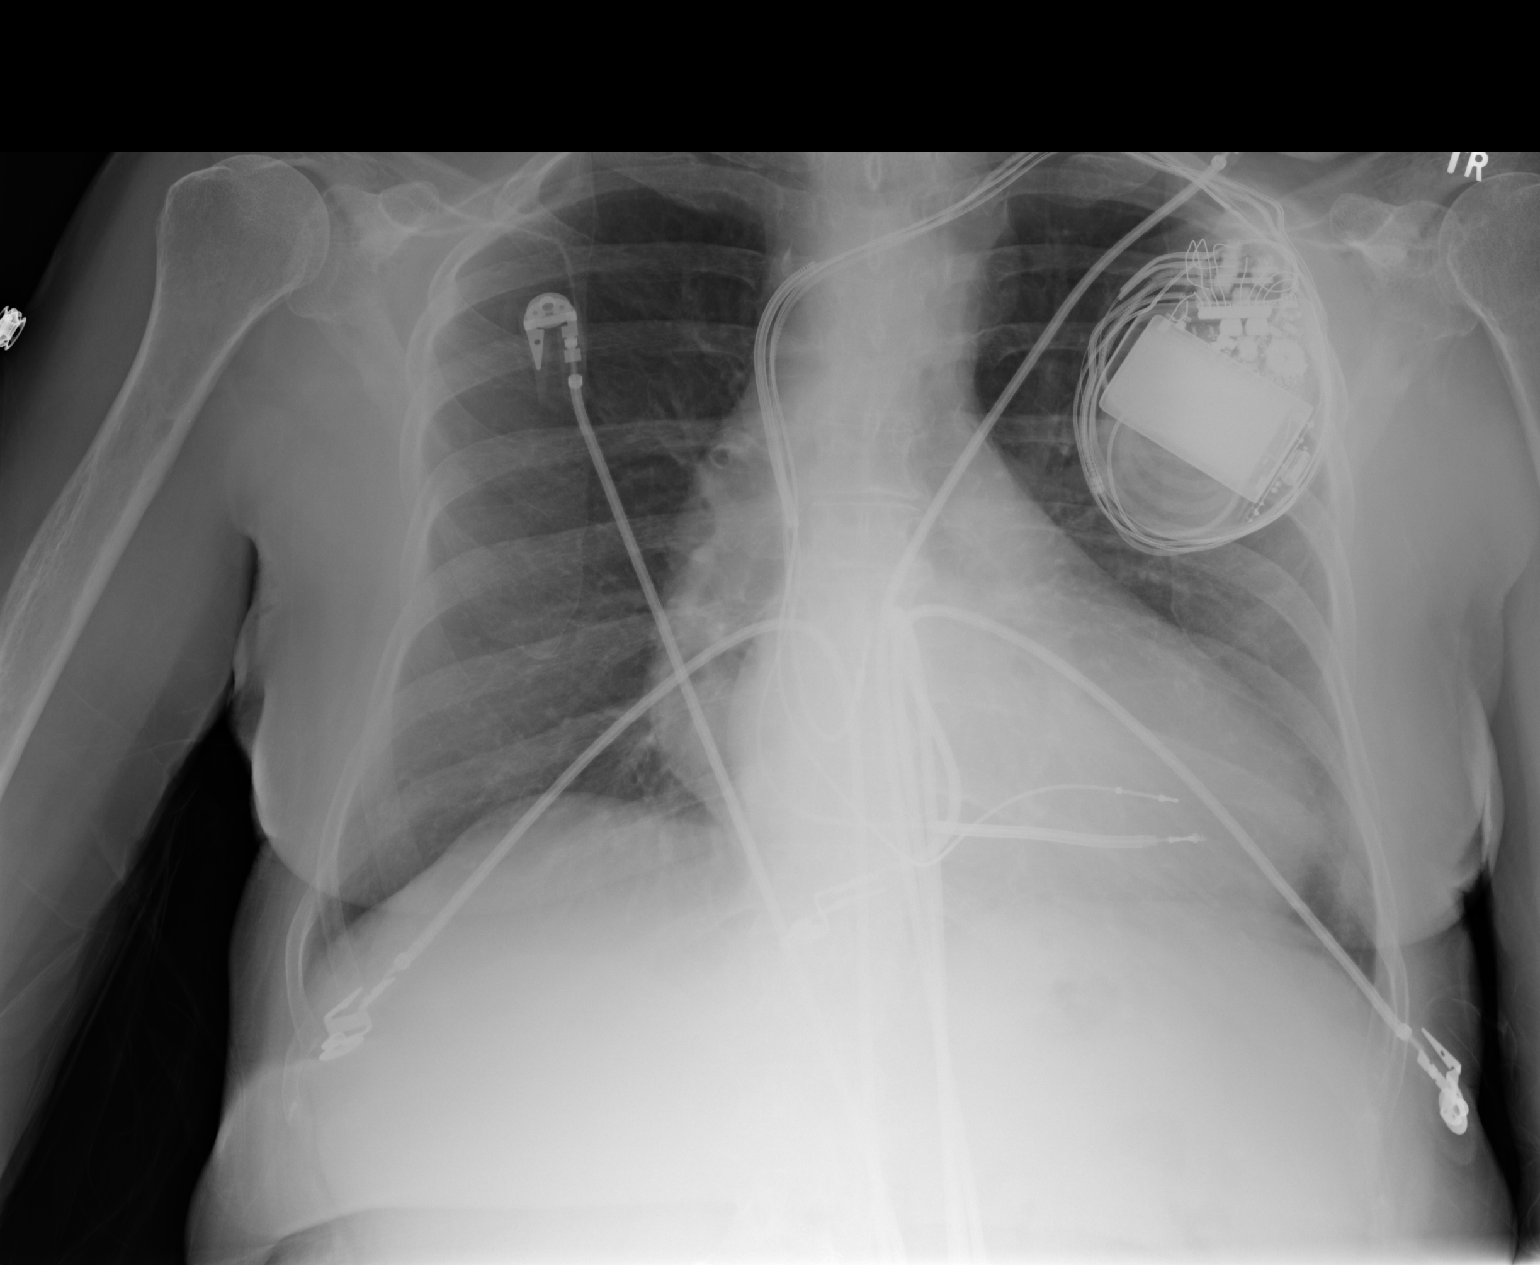

[1 of 1 positions shown; findings below may reference images not displayed]

FINDINGS: Three leads left chest wall transvenous pacer defibrillator is noted with
1-lead in the right atrium, 1-lead in the right ventricle, and 1-lead in a
coronary vein. Low lung volumes. Pacer defibrillator battery device obscures
evaluation of left upper lung and ribs. Otherwise, there is no significant
pulmonary consolidation, pulmonary edema, pleural effusion, nor
pneumothorax. No grossly displaced fracture of the visualized ribs is noted.
IMPRESSION: 1. Please see above.

## 2008-07-19 IMAGING — CR STERNUM - 2+ VIEW
1 series · 3 of 3 positions shown · non-contrast
Comparison: none

REASON FOR EXAM: trauma, sternal pain
COMMENTS:

PROCEDURE:     DXR - DXR STERNUM  - October 23, 2007  [DATE]
RESULT:     Comparison: Chest radiograph on 10/23/2007.

[Series 1: view not recorded · 0.17mm/px · 3 of 3 slices shown]
[im 1/3]
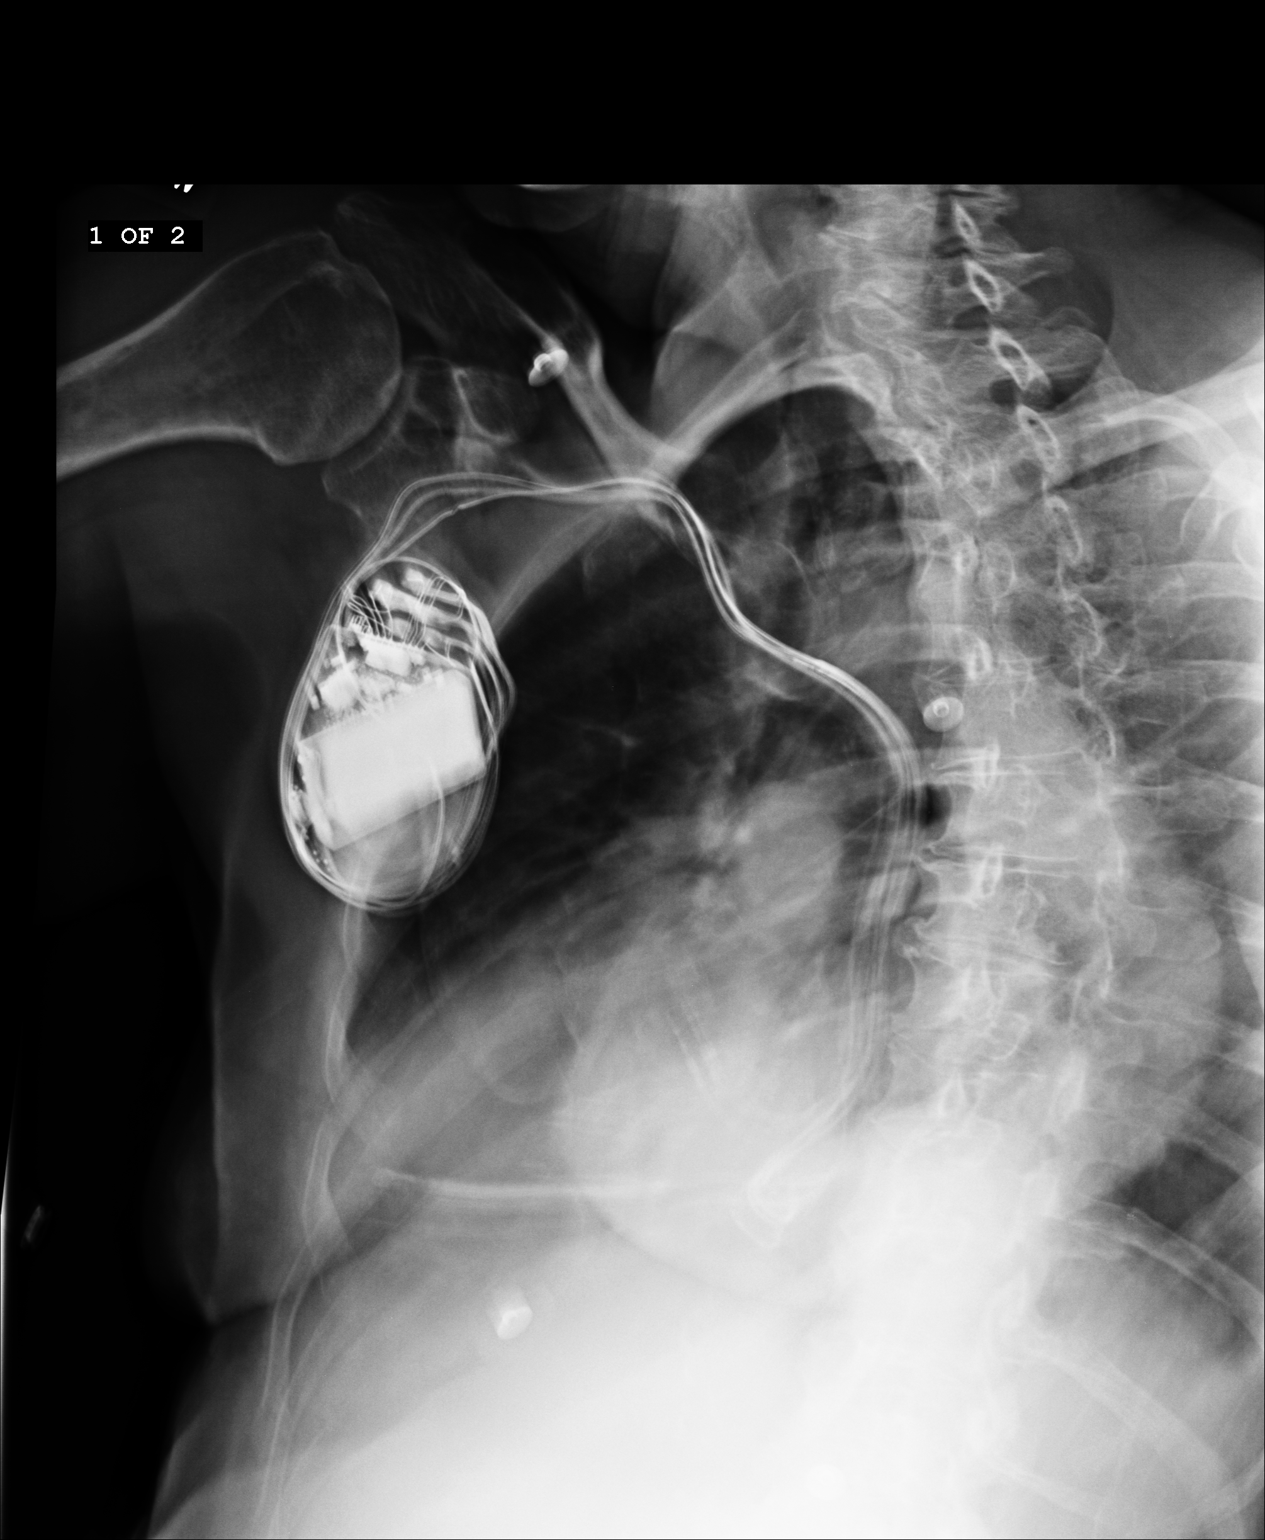
[im 2/3]
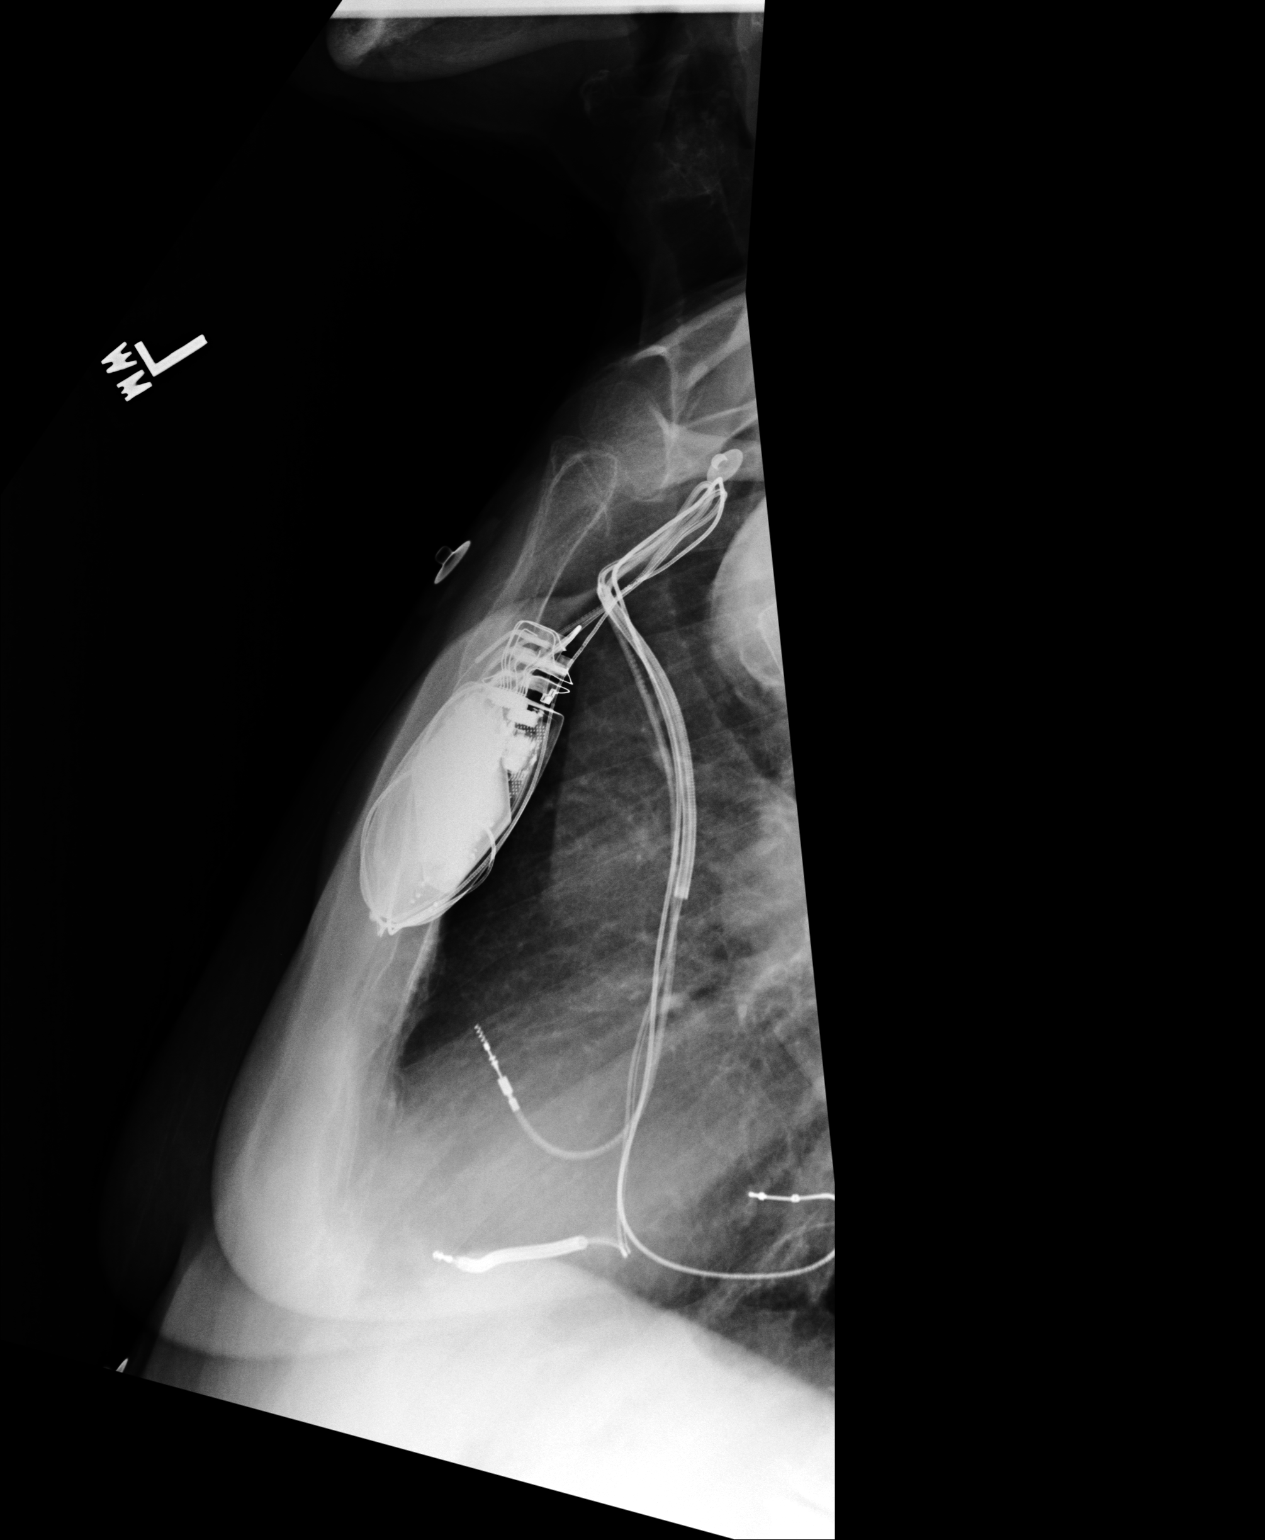
[im 3/3]
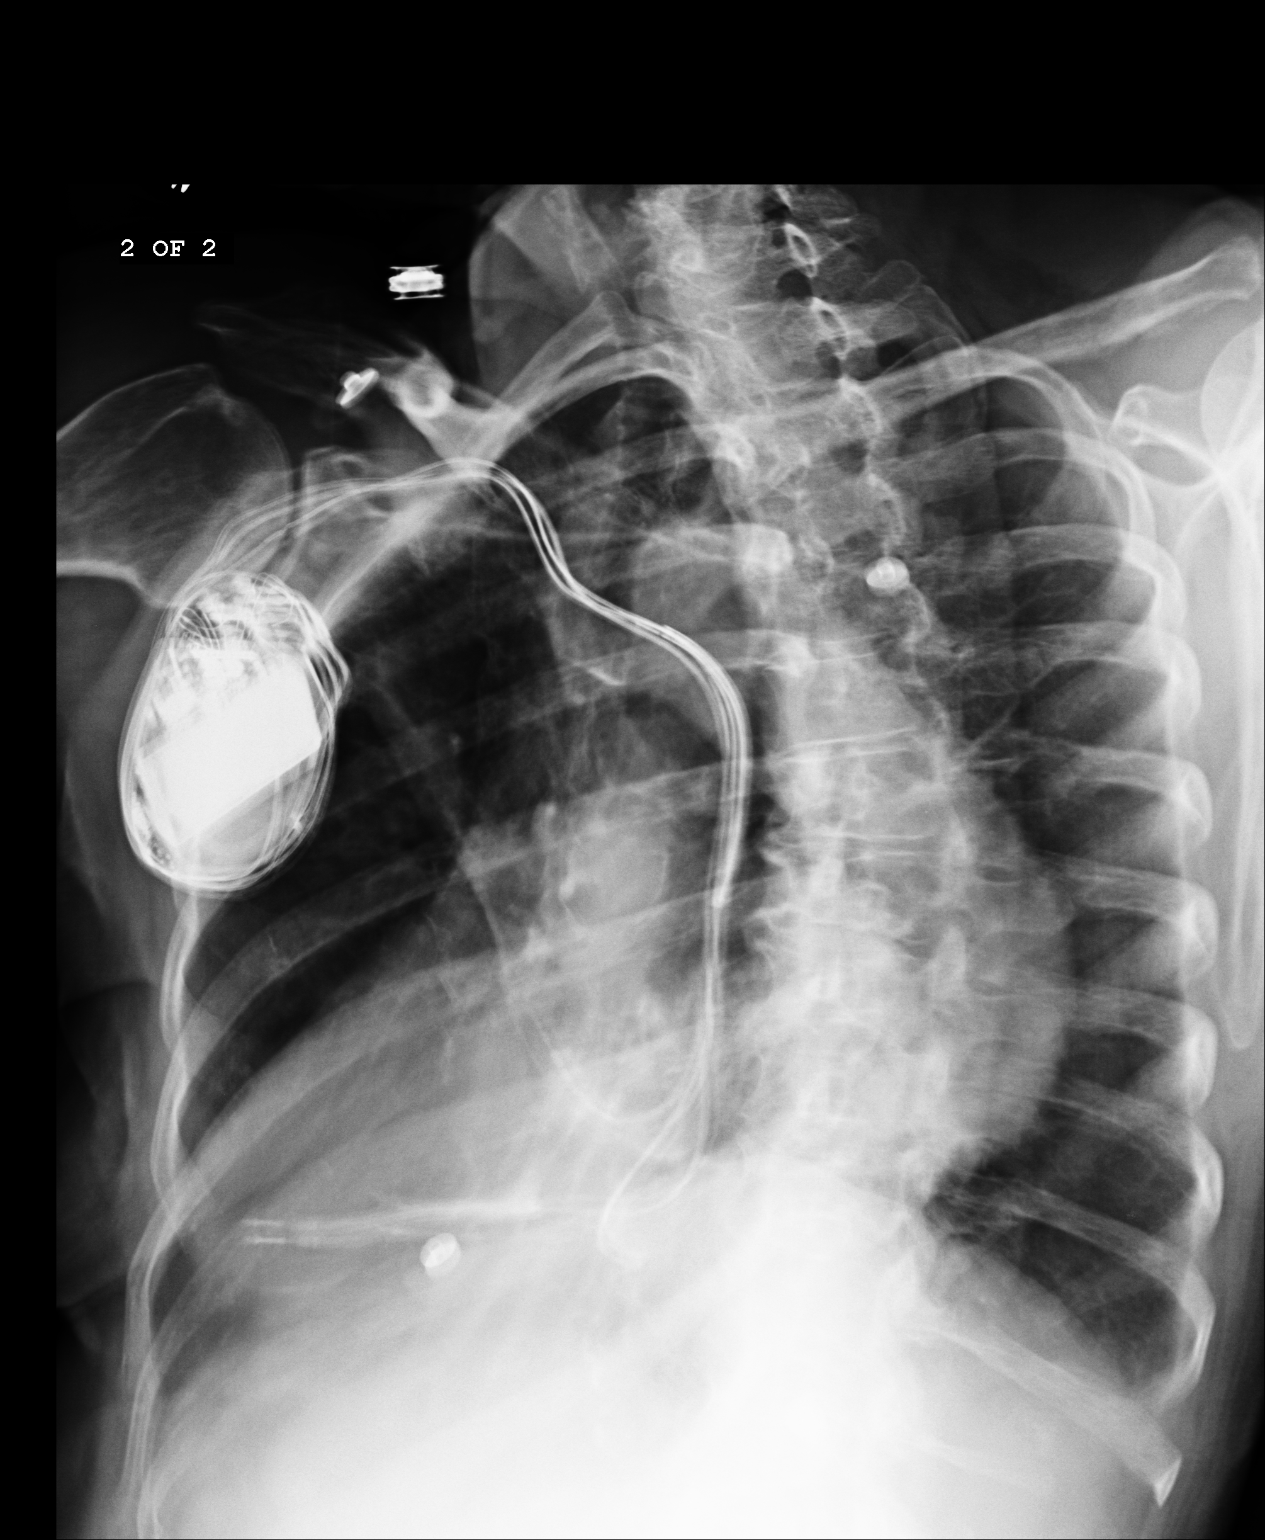

[3 of 3 positions shown; findings below may reference images not displayed]

FINDINGS: Three views of the sternum were obtained.

The midsternum is obscured by the pacer defibrillator battery box on the
lateral view. No gross fracture of the rest of the visualized sternum is
noted.
IMPRESSION: 1. If there is high clinical concern for a sternal fracture, consider
further evaluation with CT.

## 2008-09-10 ENCOUNTER — Ambulatory Visit: Payer: Self-pay | Admitting: Internal Medicine

## 2008-12-07 ENCOUNTER — Ambulatory Visit: Payer: Self-pay | Admitting: Internal Medicine

## 2008-12-16 ENCOUNTER — Ambulatory Visit: Payer: Self-pay | Admitting: Internal Medicine

## 2008-12-16 DIAGNOSIS — I428 Other cardiomyopathies: Secondary | ICD-10-CM | POA: Insufficient documentation

## 2008-12-16 DIAGNOSIS — M1990 Unspecified osteoarthritis, unspecified site: Secondary | ICD-10-CM | POA: Insufficient documentation

## 2008-12-16 DIAGNOSIS — E785 Hyperlipidemia, unspecified: Secondary | ICD-10-CM

## 2008-12-16 DIAGNOSIS — R32 Unspecified urinary incontinence: Secondary | ICD-10-CM | POA: Insufficient documentation

## 2008-12-16 DIAGNOSIS — M48061 Spinal stenosis, lumbar region without neurogenic claudication: Secondary | ICD-10-CM | POA: Insufficient documentation

## 2008-12-16 DIAGNOSIS — I10 Essential (primary) hypertension: Secondary | ICD-10-CM

## 2008-12-16 DIAGNOSIS — K219 Gastro-esophageal reflux disease without esophagitis: Secondary | ICD-10-CM

## 2008-12-17 LAB — CONVERTED CEMR LAB
ALT: 23 units/L (ref 0–35)
Alkaline Phosphatase: 35 units/L — ABNORMAL LOW (ref 39–117)
BUN: 14 mg/dL (ref 6–23)
Bilirubin, Direct: 0.1 mg/dL (ref 0.0–0.3)
Chloride: 101 meq/L (ref 96–112)
Creatinine, Ser: 0.6 mg/dL (ref 0.4–1.2)
Digitoxin Lvl: 0.5 ng/mL — ABNORMAL LOW (ref 0.8–2.0)
Eosinophils Relative: 0 % (ref 0.0–5.0)
GFR calc Af Amer: 122 mL/min
Glucose, Bld: 108 mg/dL — ABNORMAL HIGH (ref 70–99)
HDL: 32 mg/dL — ABNORMAL LOW (ref >39.0)
Monocytes Relative: 6.7 % (ref 3.0–12.0)
Neutrophils Relative %: 61 % (ref 43.0–77.0)
Phosphorus: 4.3 mg/dL (ref 2.3–4.6)
Platelets: 170 10*3/uL (ref 150–400)
RDW: 13.3 % (ref 11.5–14.6)
TSH: 3.71 microintl units/mL (ref 0.35–5.50)
Total Bilirubin: 1 mg/dL (ref 0.3–1.2)
Total CHOL/HDL Ratio: 4.6
Total Protein: 7.9 g/dL (ref 6.0–8.3)
Triglycerides: 213 mg/dL (ref 0–149)
VLDL: 43 mg/dL — ABNORMAL HIGH (ref 0–40)
WBC: 9.3 10*3/uL (ref 4.5–10.5)

## 2009-01-19 ENCOUNTER — Ambulatory Visit: Payer: Self-pay | Admitting: Cardiology

## 2009-02-02 ENCOUNTER — Ambulatory Visit: Payer: Self-pay | Admitting: Internal Medicine

## 2009-02-02 DIAGNOSIS — L509 Urticaria, unspecified: Secondary | ICD-10-CM

## 2009-02-22 IMAGING — CR DG CERVICAL SPINE COMPLETE 4+V
5 series · 5 of 5 positions shown · non-contrast
Comparison: Bone scan today.

CLINICAL DATA: Positive bone scan.  Neck pain.

CERVICAL SPINE - COMPLETE 4+ VIEW

[w c-spine lat]
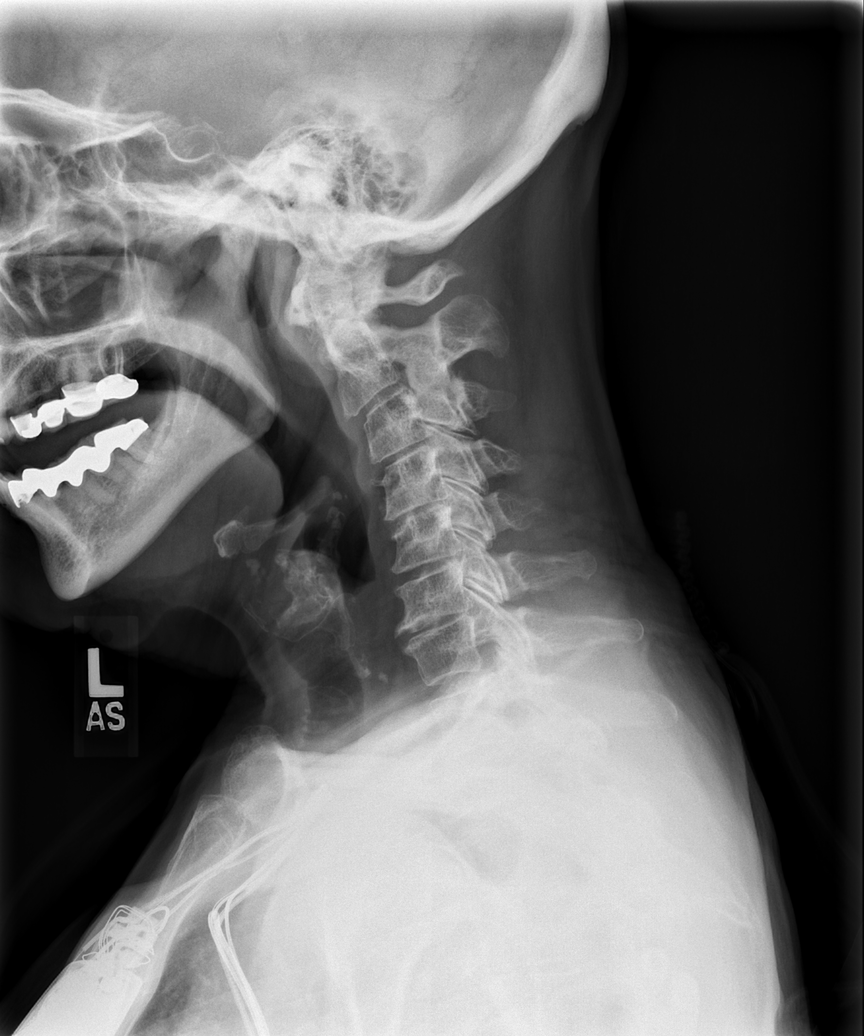

[w c-spine oblique (1 of 2)]
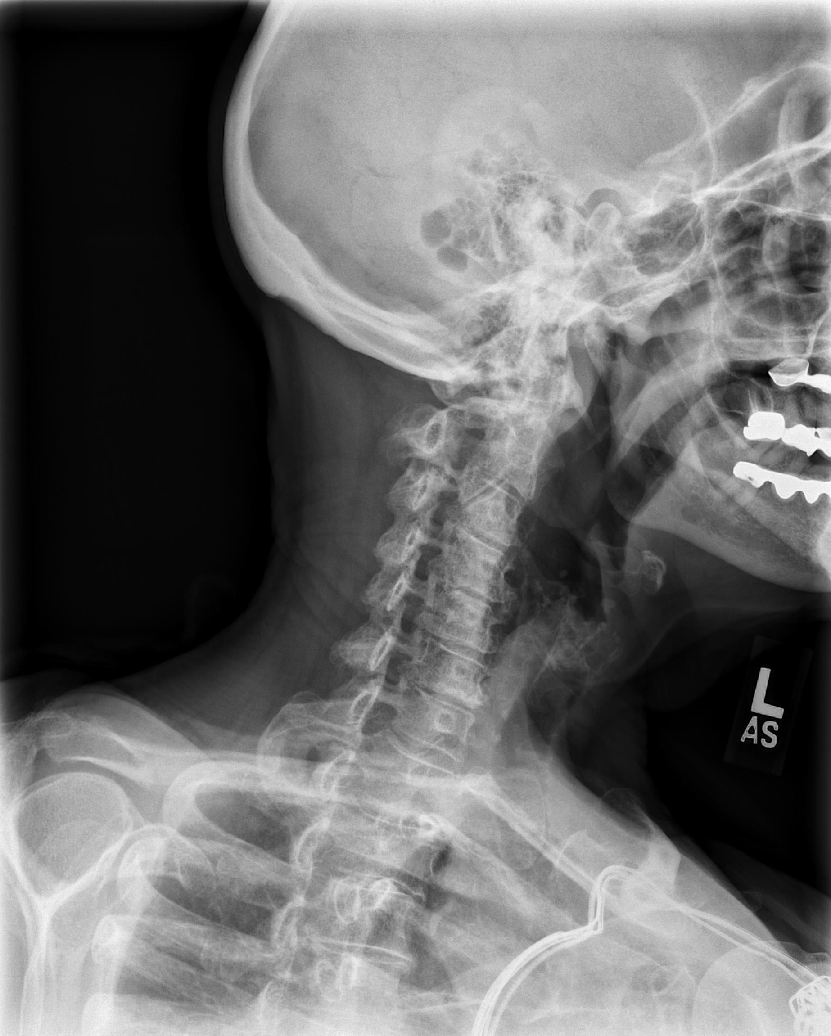

[w c-spine oblique (2 of 2)]
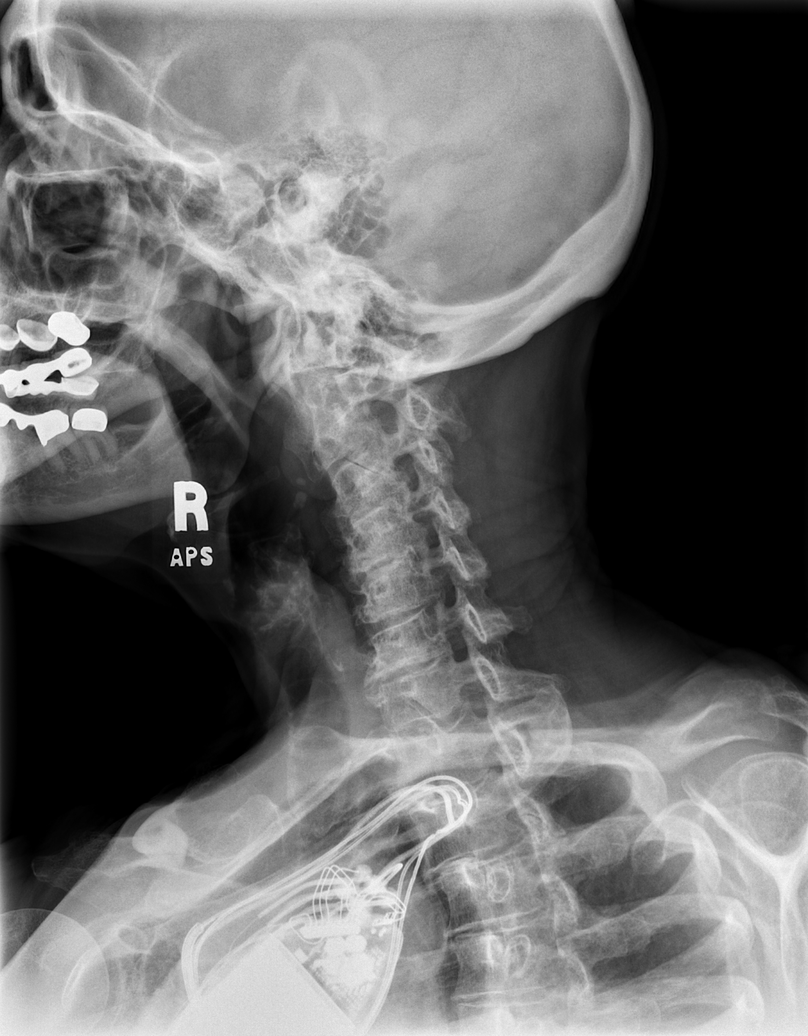

[w c-spine a.p. *]
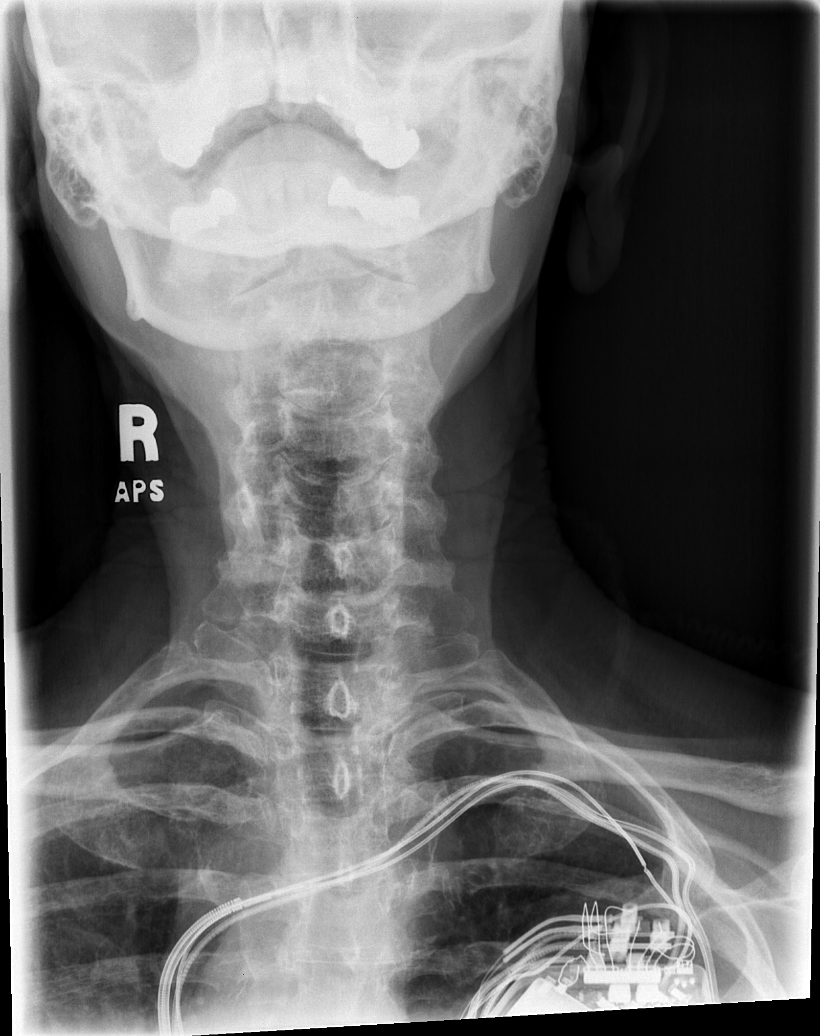

[w c-spine odontoid *]
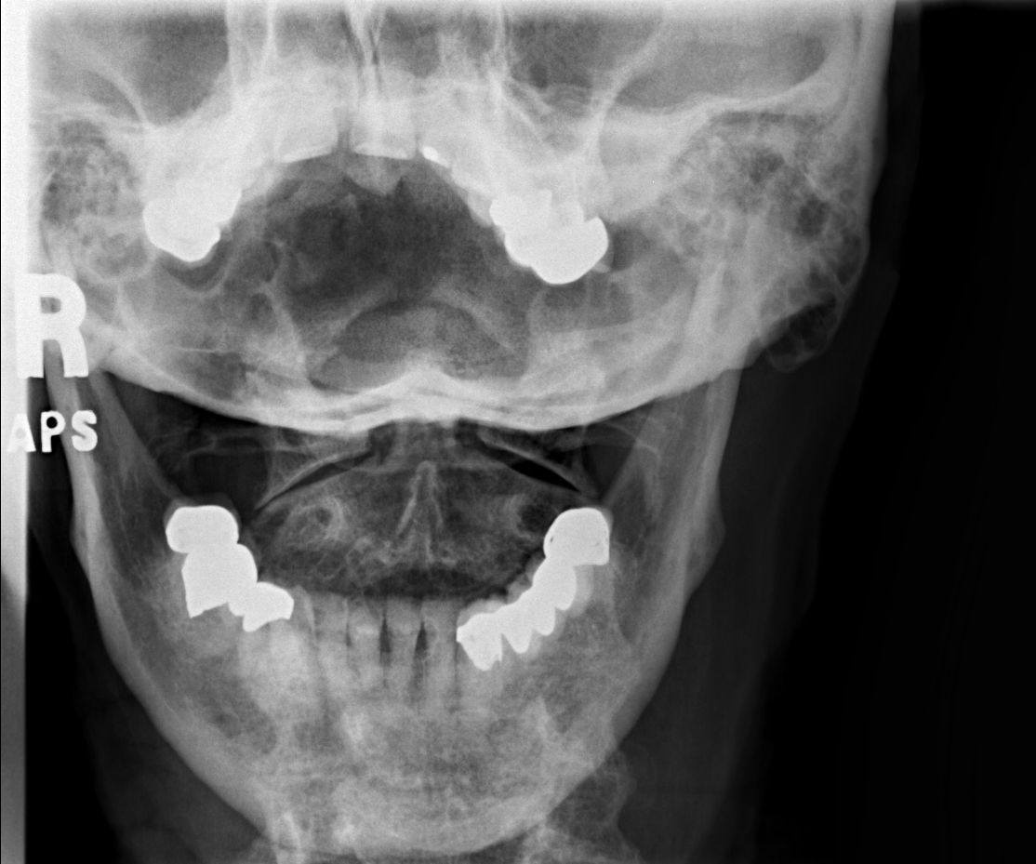

[5 of 5 positions shown; findings below may reference images not displayed]

FINDINGS: Disc space narrowing at C3-4, C4-5, minimally at C5-6,
and particularly at C6-7.  Osteophytic formation mainly anteriorly
at C6-7.  Diffuse facet hypertrophy secondary to degenerative
changes.  The facet arthropathy changes are particularly noted on
the right C2-3 and account for the increased uptake noted on the
bone scan.  Loss of normal lordotic curvature with mild cervical
kyphosis.  No subluxation.
IMPRESSION: Degenerative spondylosis as described above

## 2009-02-22 IMAGING — CR DG LUMBAR SPINE 2-3V
2 series · 2 of 2 positions shown · non-contrast
Comparison: Bone scan today

CLINICAL DATA: Low back pain.  Positive bone scan

LUMBAR SPINE - 2-3 VIEW

[t l-spine a.p.]
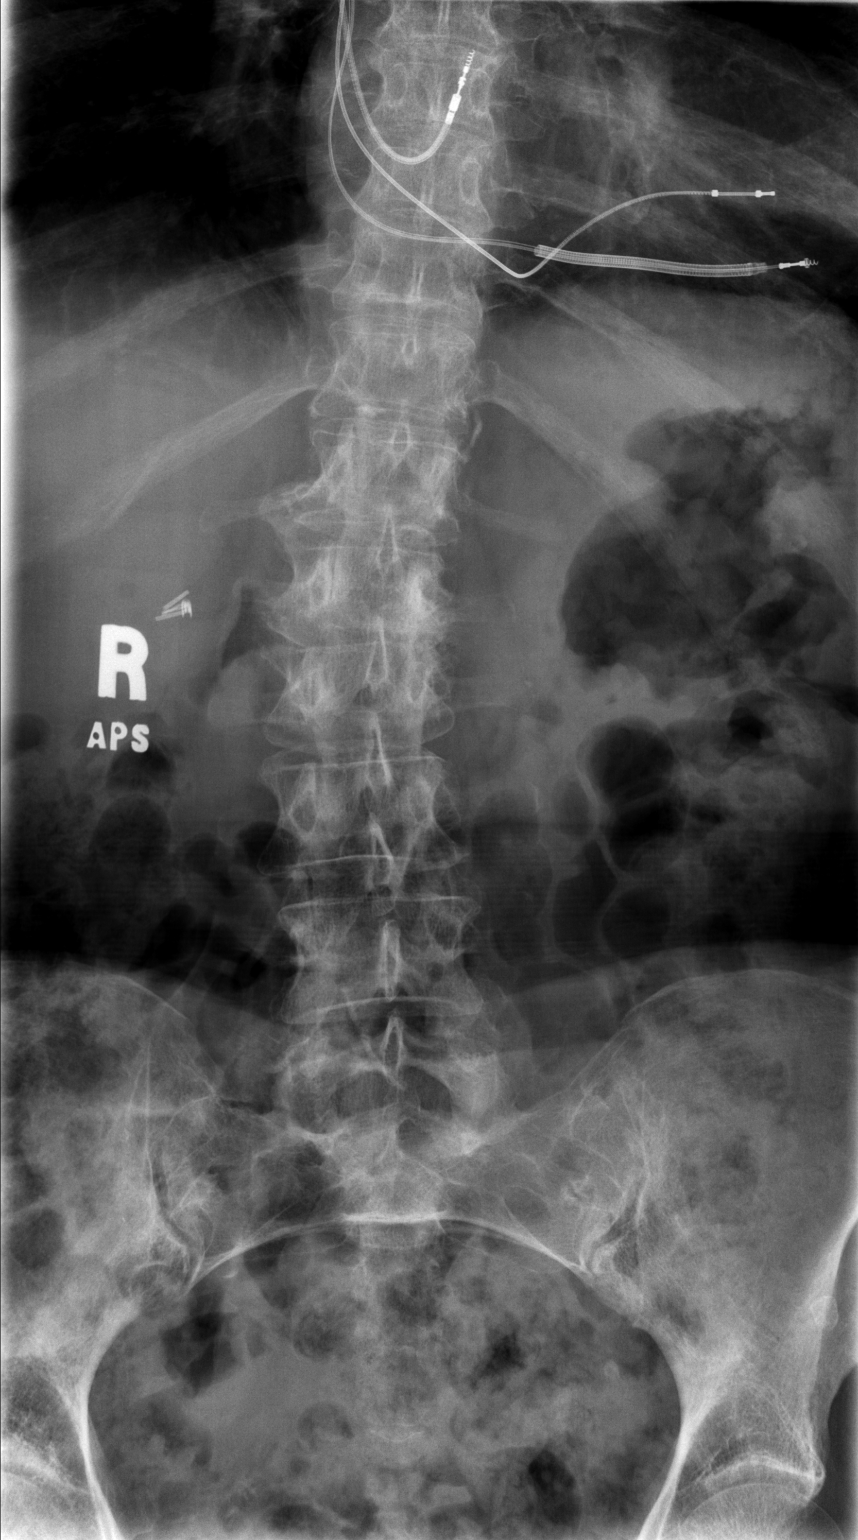

[t l-spine lat]
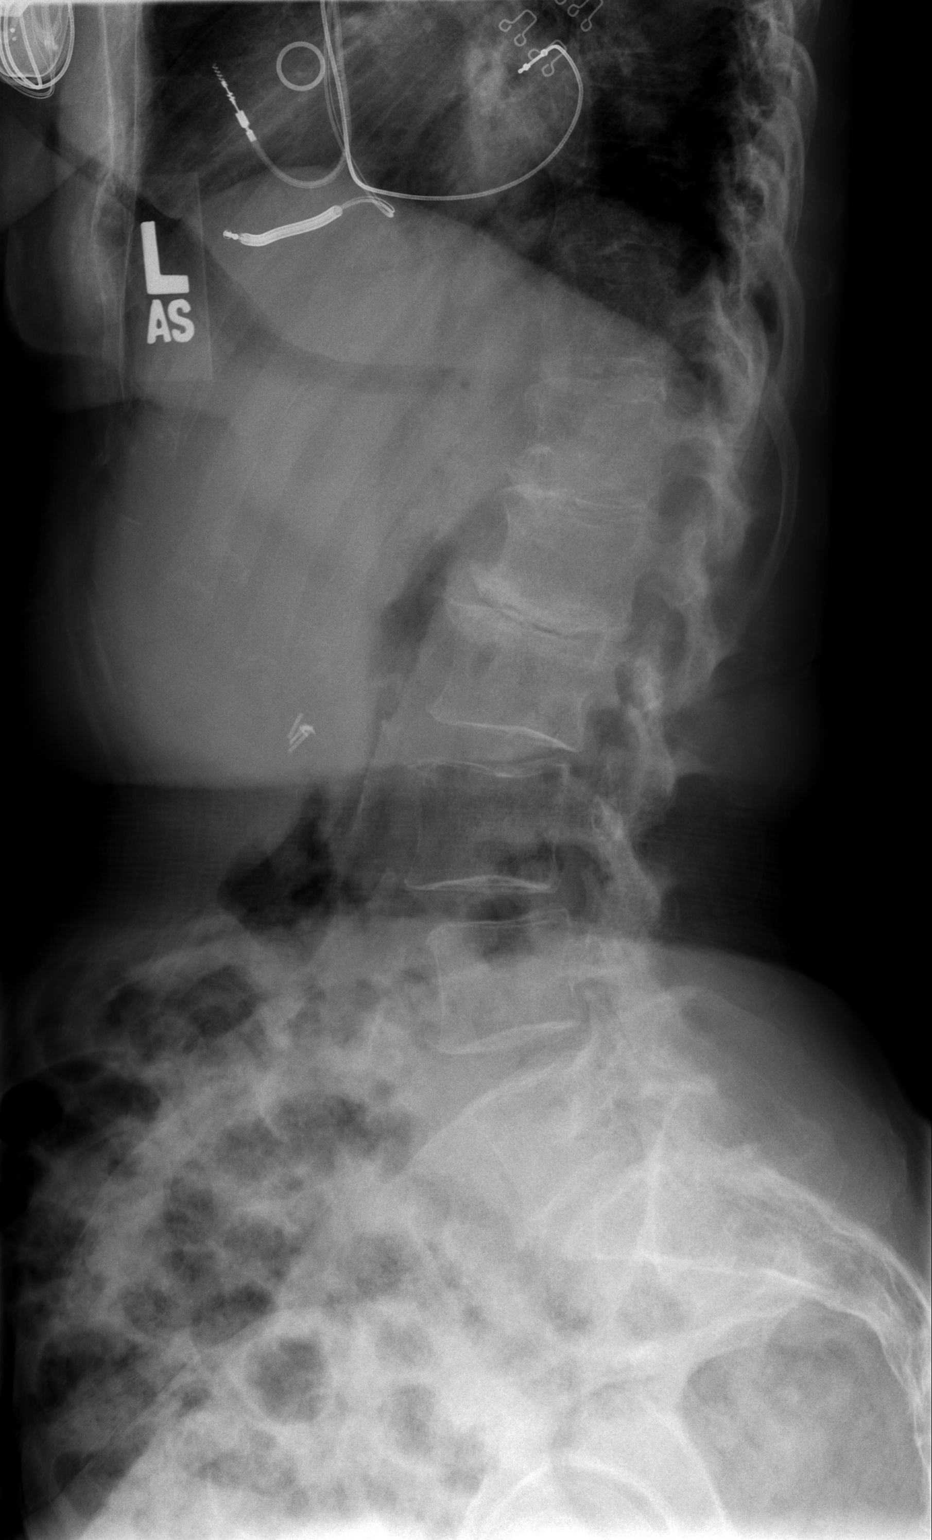

[2 of 2 positions shown; findings below may reference images not displayed]

FINDINGS: Advanced degenerative disc disease changes on the left at
L1-2 with disc space narrowing and endplate sclerosis plus
osteophytic formation accounts for the increased tracer uptake
noted on the bone scan.  Minimal anterolisthesis of L3-4 by 3 mm.
No pars defects.  Diffuse bony demineralization. Cardiac pacer
leads including RV AICD lead are incidentally noted.
IMPRESSION: Advanced DDD on the left at L1-2 accounting for the increased
tracer accumulation on the bone scan.

## 2009-02-22 IMAGING — CR DG HIP COMPLETE 2+V*R*
2 series · 2 of 2 positions shown · non-contrast
Comparison: Bone scan today.

CLINICAL DATA: Right hip pain.  Radiographs to correlate with bone
scan.

RIGHT HIP - COMPLETE 2+ VIEW

[t hip ap right]
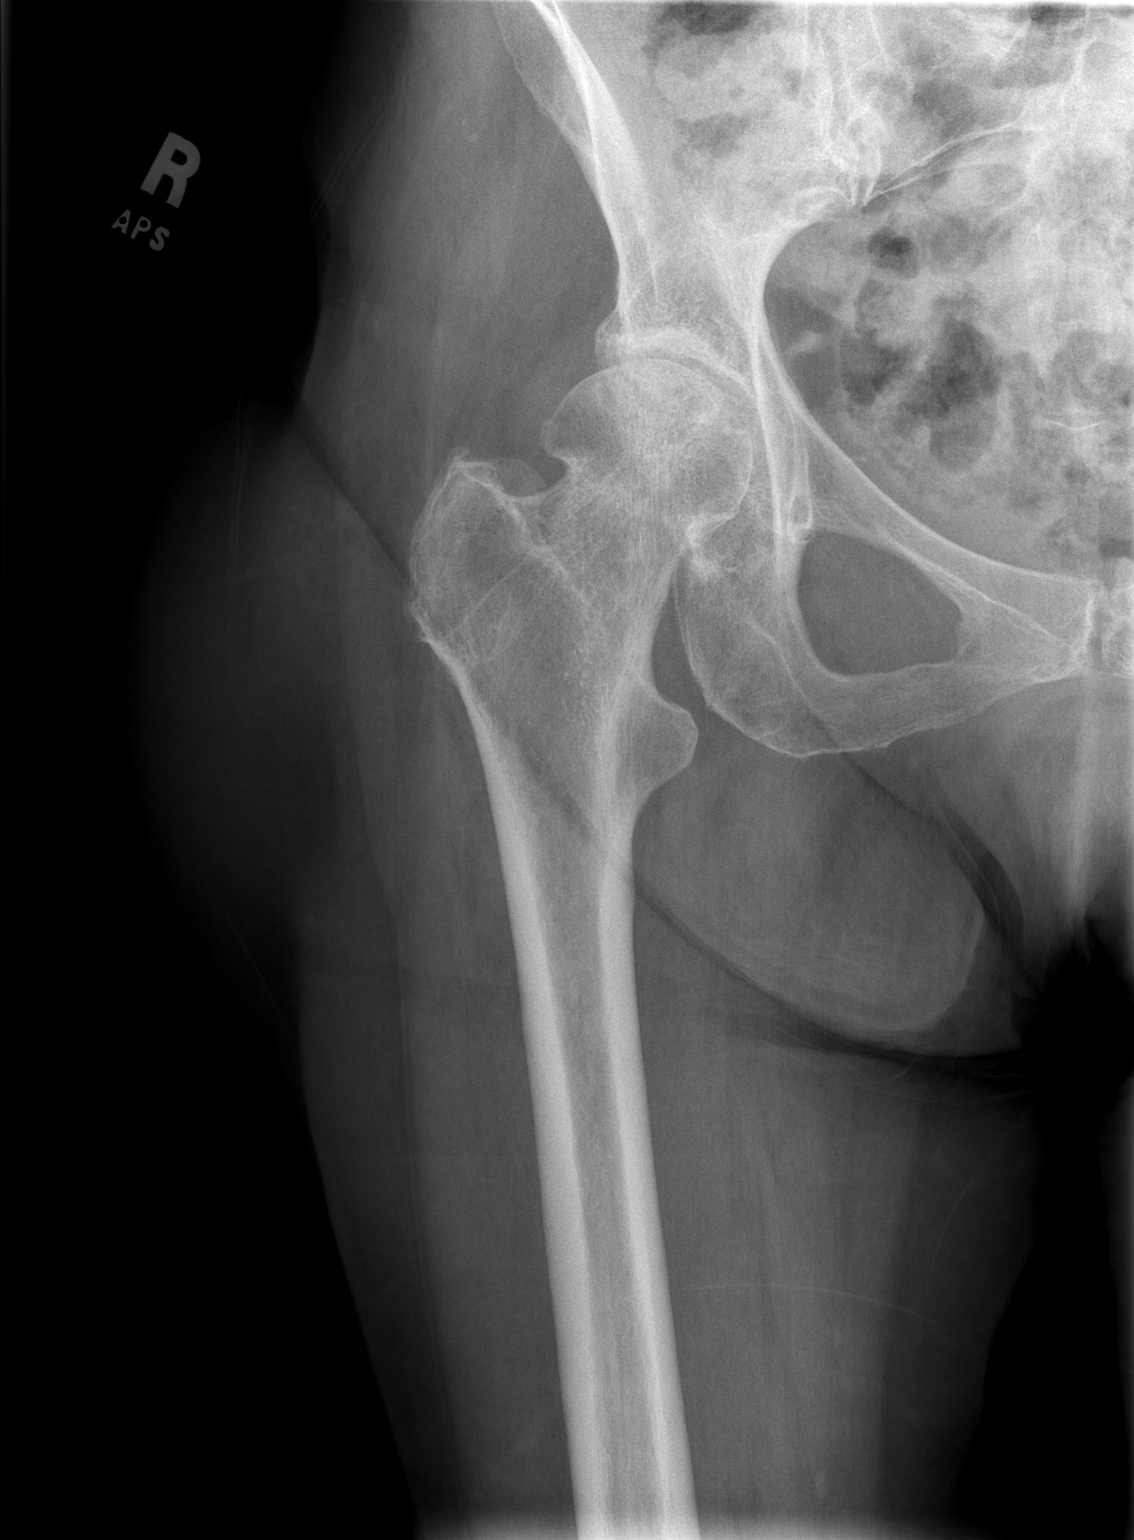

[t hip frog leg right]
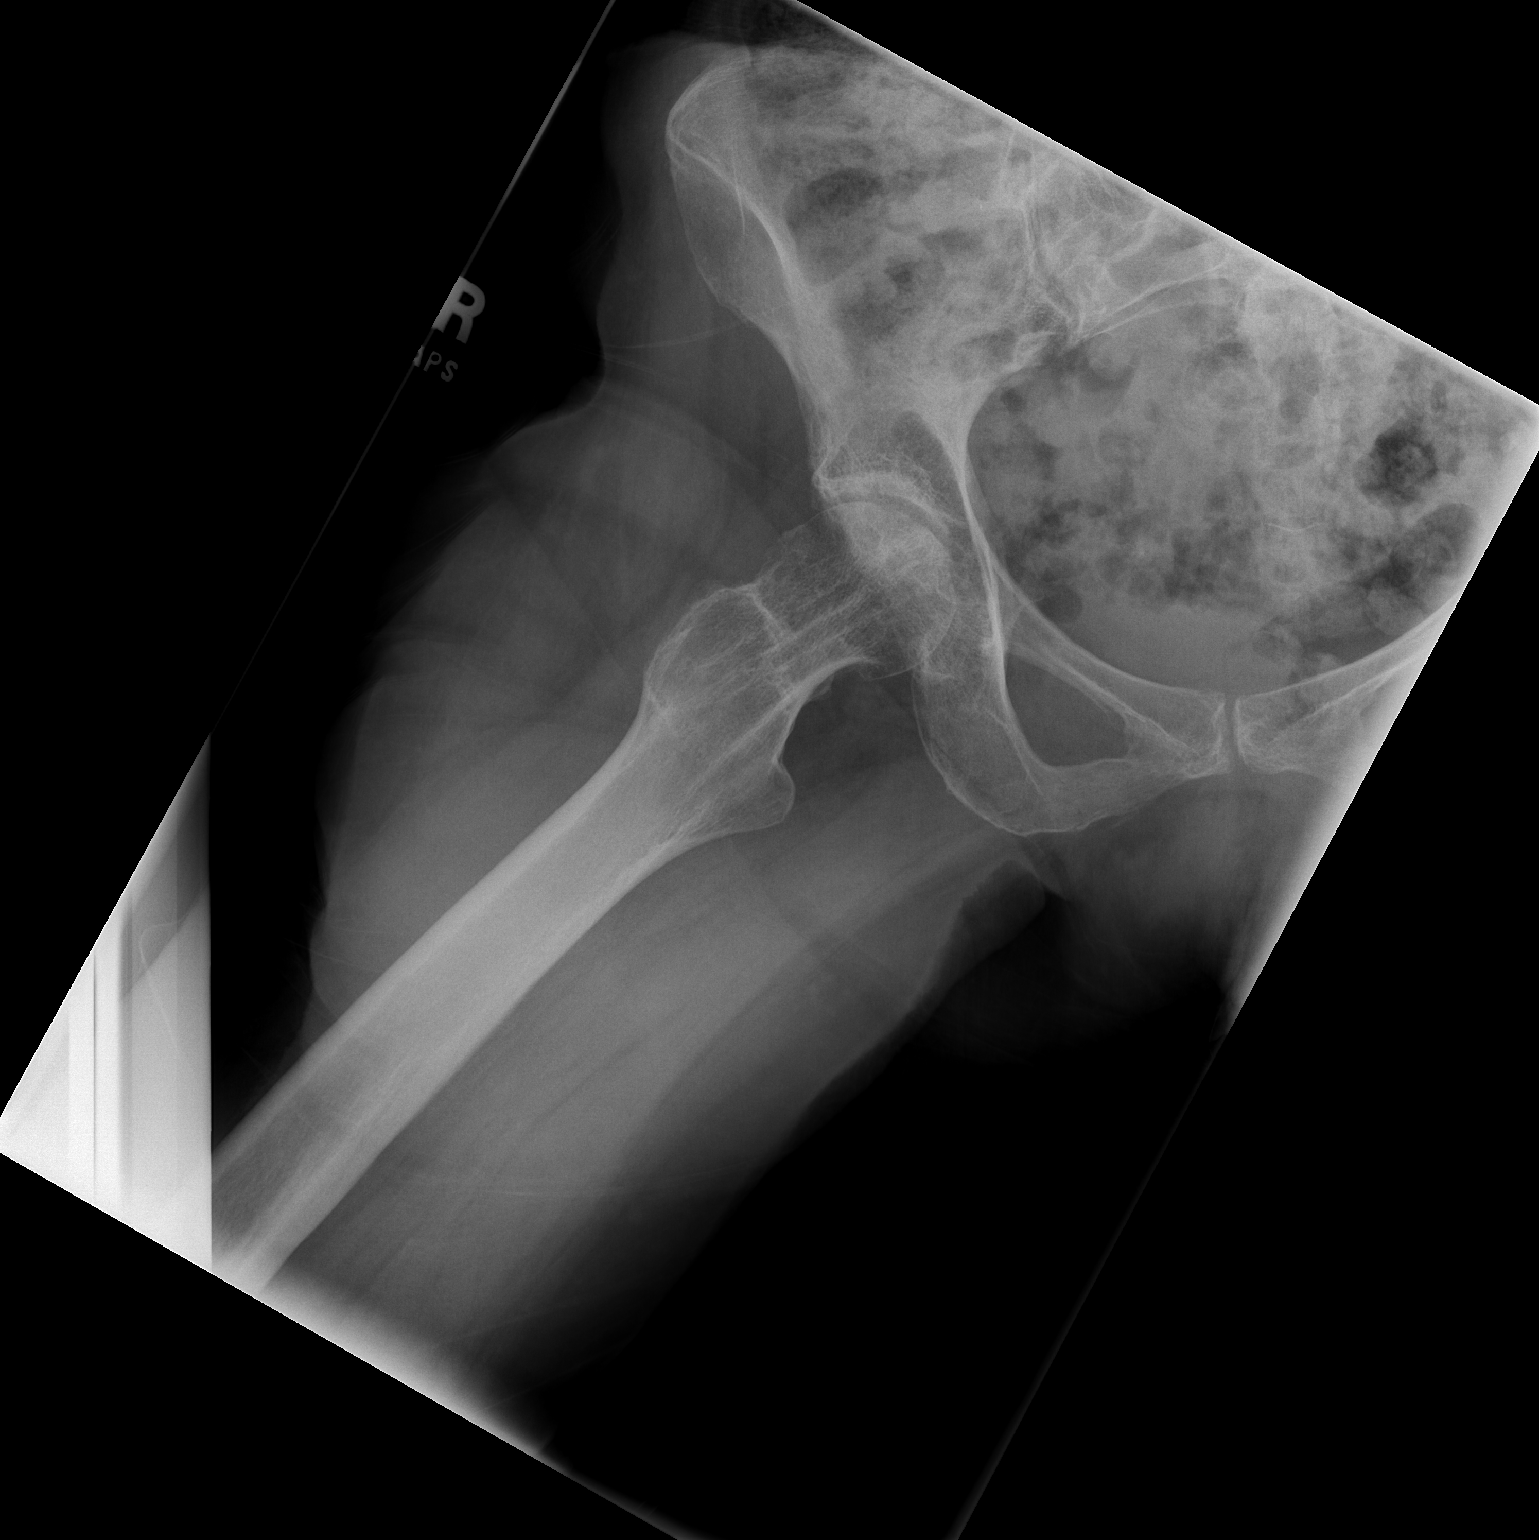

[2 of 2 positions shown; findings below may reference images not displayed]

FINDINGS: Narrowing of the superolateral aspect the hip joint
space.  Subarticular sclerosis.  Small cystic-like foci in the
femoral head as well.  Residual tracts for previous pins to reduce
a femoral subcapital fracture are noted.
IMPRESSION: Degenerative and post-traumatic changes of the right hip.

## 2009-03-02 ENCOUNTER — Encounter: Payer: Self-pay | Admitting: Internal Medicine

## 2009-03-11 ENCOUNTER — Ambulatory Visit: Payer: Self-pay | Admitting: Internal Medicine

## 2009-03-17 ENCOUNTER — Ambulatory Visit: Payer: Self-pay | Admitting: Internal Medicine

## 2009-04-23 ENCOUNTER — Encounter: Payer: Self-pay | Admitting: Internal Medicine

## 2009-05-10 DIAGNOSIS — I5022 Chronic systolic (congestive) heart failure: Secondary | ICD-10-CM

## 2009-06-08 ENCOUNTER — Telehealth: Payer: Self-pay | Admitting: Cardiology

## 2009-06-10 ENCOUNTER — Ambulatory Visit: Payer: Self-pay | Admitting: Internal Medicine

## 2009-06-22 ENCOUNTER — Telehealth: Payer: Self-pay | Admitting: Cardiology

## 2009-08-17 ENCOUNTER — Ambulatory Visit: Payer: Self-pay | Admitting: Cardiology

## 2009-08-17 DIAGNOSIS — I447 Left bundle-branch block, unspecified: Secondary | ICD-10-CM | POA: Insufficient documentation

## 2009-09-09 ENCOUNTER — Ambulatory Visit: Payer: Self-pay | Admitting: Internal Medicine

## 2009-09-17 ENCOUNTER — Encounter: Payer: Self-pay | Admitting: Internal Medicine

## 2009-10-07 ENCOUNTER — Emergency Department: Payer: Self-pay | Admitting: Internal Medicine

## 2009-10-11 ENCOUNTER — Encounter: Admission: RE | Admit: 2009-10-11 | Discharge: 2009-10-11 | Payer: Self-pay | Admitting: Internal Medicine

## 2009-12-13 ENCOUNTER — Ambulatory Visit: Payer: Self-pay | Admitting: Internal Medicine

## 2010-01-10 ENCOUNTER — Ambulatory Visit: Payer: Medicare Other | Admitting: Internal Medicine

## 2010-01-20 ENCOUNTER — Ambulatory Visit: Payer: Self-pay | Admitting: Internal Medicine

## 2010-01-21 ENCOUNTER — Encounter: Payer: Self-pay | Admitting: Internal Medicine

## 2010-01-27 ENCOUNTER — Telehealth: Payer: Self-pay | Admitting: Internal Medicine

## 2010-02-01 ENCOUNTER — Encounter: Payer: Self-pay | Admitting: Cardiology

## 2010-02-01 ENCOUNTER — Ambulatory Visit: Payer: Self-pay | Admitting: Internal Medicine

## 2010-02-02 ENCOUNTER — Telehealth (INDEPENDENT_AMBULATORY_CARE_PROVIDER_SITE_OTHER): Payer: Self-pay | Admitting: *Deleted

## 2010-02-09 ENCOUNTER — Ambulatory Visit: Payer: Self-pay | Admitting: Internal Medicine

## 2010-02-09 LAB — CONVERTED CEMR LAB
Basophils Relative: 0.4 % (ref 0.0–3.0)
CO2: 34 meq/L — ABNORMAL HIGH (ref 19–32)
Chloride: 107 meq/L (ref 96–112)
Creatinine, Ser: 0.7 mg/dL (ref 0.4–1.2)
Eosinophils Absolute: 0 10*3/uL (ref 0.0–0.7)
Eosinophils Relative: 0.1 % (ref 0.0–5.0)
HCT: 39.1 % (ref 36.0–46.0)
Lymphs Abs: 2.1 10*3/uL (ref 0.7–4.0)
MCHC: 33.7 g/dL (ref 30.0–36.0)
MCV: 103 fL — ABNORMAL HIGH (ref 78.0–100.0)
Monocytes Absolute: 0.6 10*3/uL (ref 0.1–1.0)
Neutro Abs: 2.9 10*3/uL (ref 1.4–7.7)
Neutrophils Relative %: 51.2 % (ref 43.0–77.0)
Potassium: 4.3 meq/L (ref 3.5–5.1)
RBC: 3.8 M/uL — ABNORMAL LOW (ref 3.87–5.11)
WBC: 5.6 10*3/uL (ref 4.5–10.5)

## 2010-02-16 ENCOUNTER — Ambulatory Visit: Payer: Self-pay | Admitting: Internal Medicine

## 2010-02-16 ENCOUNTER — Ambulatory Visit (HOSPITAL_COMMUNITY): Admission: RE | Admit: 2010-02-16 | Discharge: 2010-02-16 | Payer: Self-pay | Admitting: Internal Medicine

## 2010-02-17 ENCOUNTER — Encounter: Payer: Self-pay | Admitting: Internal Medicine

## 2010-02-17 ENCOUNTER — Telehealth: Payer: Self-pay | Admitting: Internal Medicine

## 2010-02-24 ENCOUNTER — Encounter: Payer: Self-pay | Admitting: Internal Medicine

## 2010-02-24 ENCOUNTER — Ambulatory Visit: Payer: Self-pay

## 2010-03-10 ENCOUNTER — Inpatient Hospital Stay (HOSPITAL_COMMUNITY): Admission: EM | Admit: 2010-03-10 | Discharge: 2010-03-11 | Payer: Self-pay | Admitting: Emergency Medicine

## 2010-03-10 ENCOUNTER — Ambulatory Visit: Payer: Self-pay | Admitting: Internal Medicine

## 2010-03-10 ENCOUNTER — Encounter (INDEPENDENT_AMBULATORY_CARE_PROVIDER_SITE_OTHER): Payer: Self-pay | Admitting: Internal Medicine

## 2010-03-11 ENCOUNTER — Encounter: Payer: Self-pay | Admitting: Internal Medicine

## 2010-04-04 DIAGNOSIS — I2510 Atherosclerotic heart disease of native coronary artery without angina pectoris: Secondary | ICD-10-CM

## 2010-04-05 ENCOUNTER — Ambulatory Visit: Payer: Self-pay | Admitting: Cardiology

## 2010-05-10 ENCOUNTER — Ambulatory Visit: Payer: Self-pay | Admitting: Internal Medicine

## 2010-05-10 ENCOUNTER — Encounter: Payer: Self-pay | Admitting: Internal Medicine

## 2010-07-04 IMAGING — CR DG CHEST 1V PORT
1 series · 1 of 1 positions shown · non-contrast
Comparison: none

REASON FOR EXAM: cp
COMMENTS:

[view not recorded]
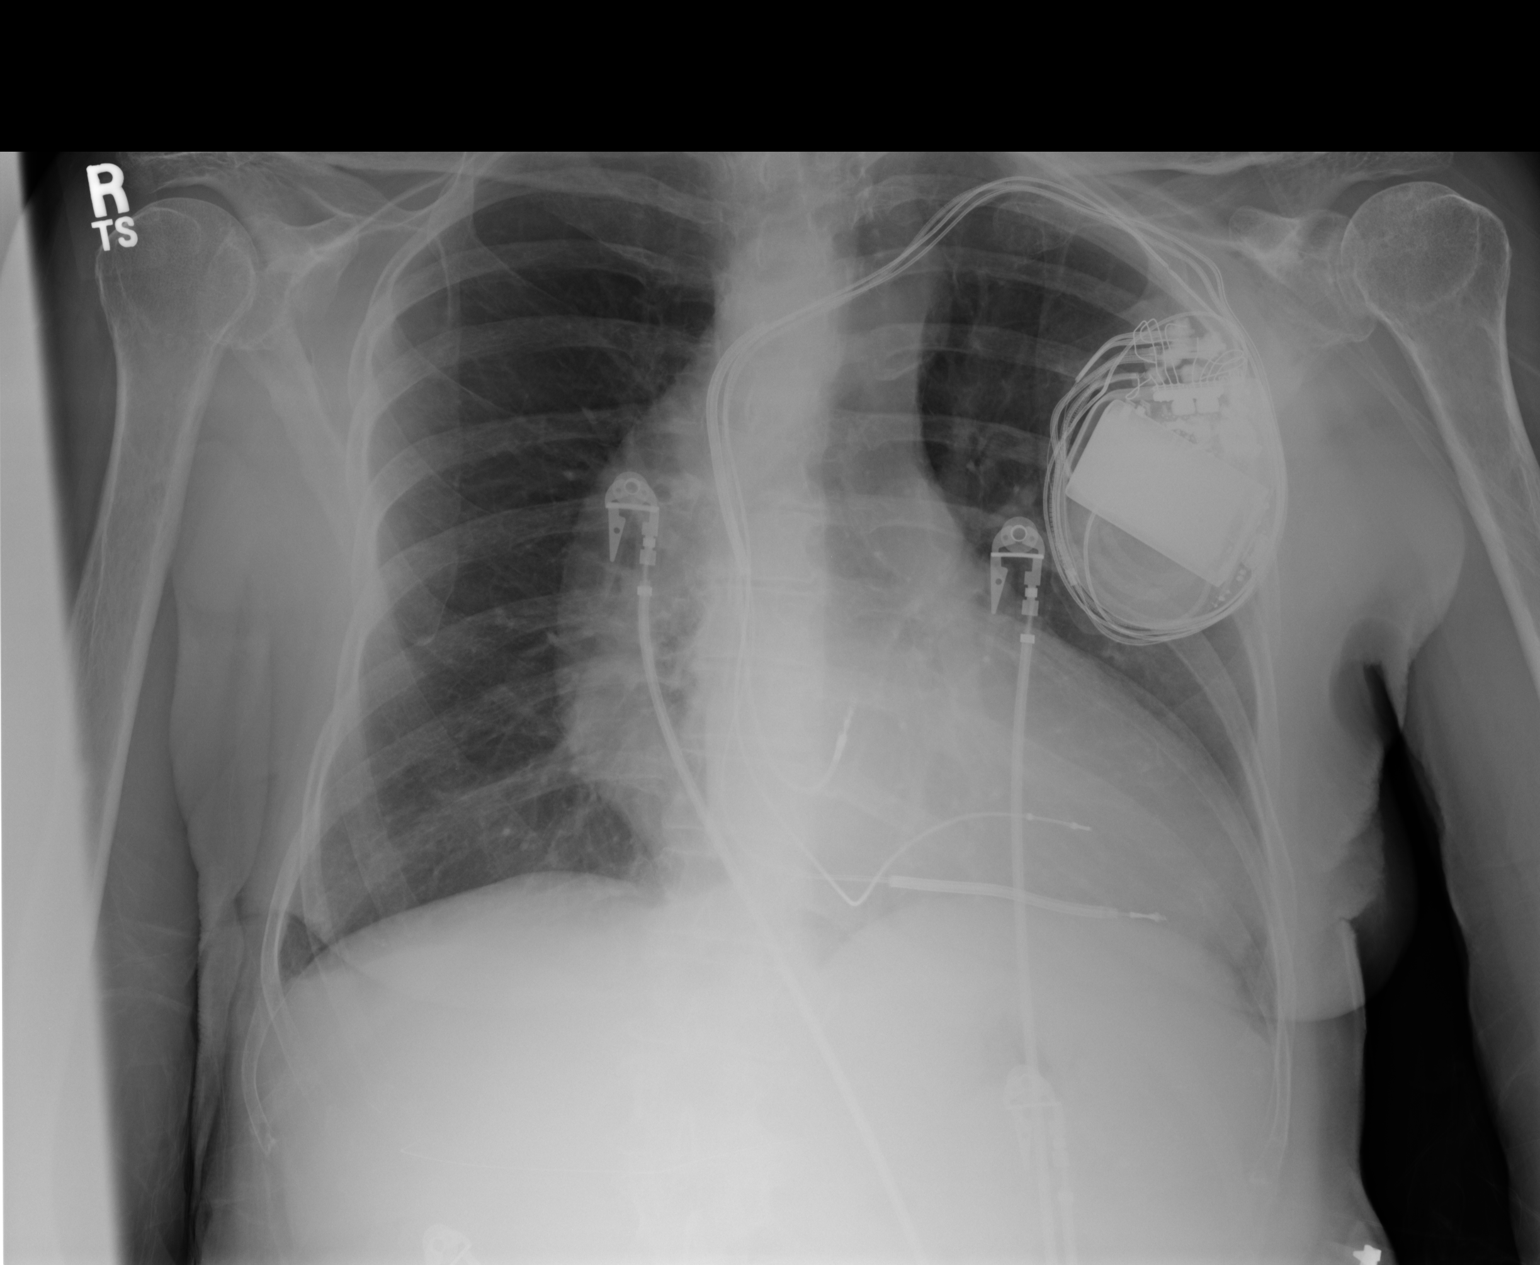

[1 of 1 positions shown; findings below may reference images not displayed]

PROCEDURE:     DXR - DXR PORTABLE CHEST SINGLE VIEW  - October 07, 2009  [DATE]

RESULT:     The lung fields are clear. No pneumonia is seen. No pleural
effusion or pneumothorax is noted. The heart is enlarged but stable in size
as compared to the prior exam. An electronic control device compatible with
a pacemaker or defibrillator is again seen.
IMPRESSION: 1. The lung fields are clear.
2. Stable cardiomegaly.

## 2010-07-05 ENCOUNTER — Ambulatory Visit: Payer: Self-pay | Admitting: Cardiology

## 2010-07-08 IMAGING — CR DG HIP (WITH OR WITHOUT PELVIS) 2-3V*L*
2 series · 2 of 2 positions shown · non-contrast
Comparison: None

CLINICAL DATA: Left hip pain, no acute injury

LEFT HIP - COMPLETE 2+ VIEW

[t hip ap left]
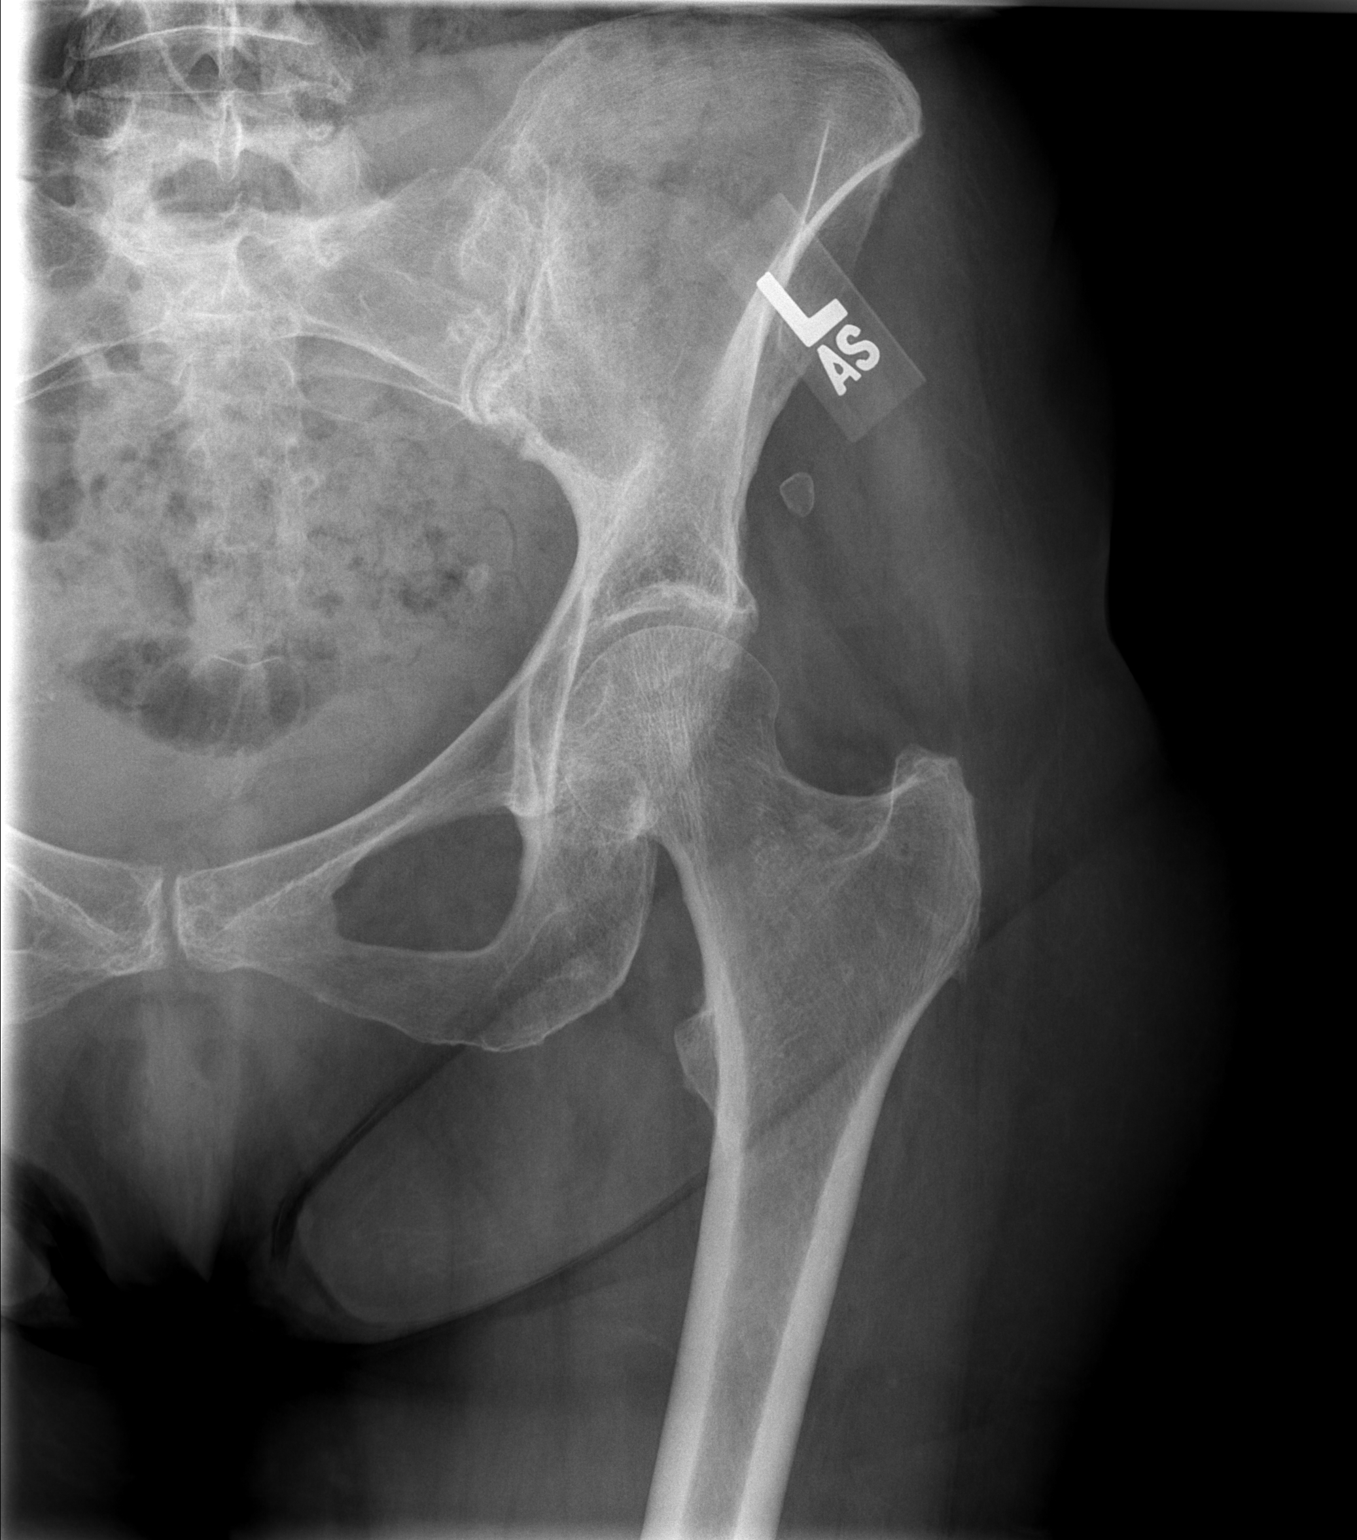

[t hip frog leg left]
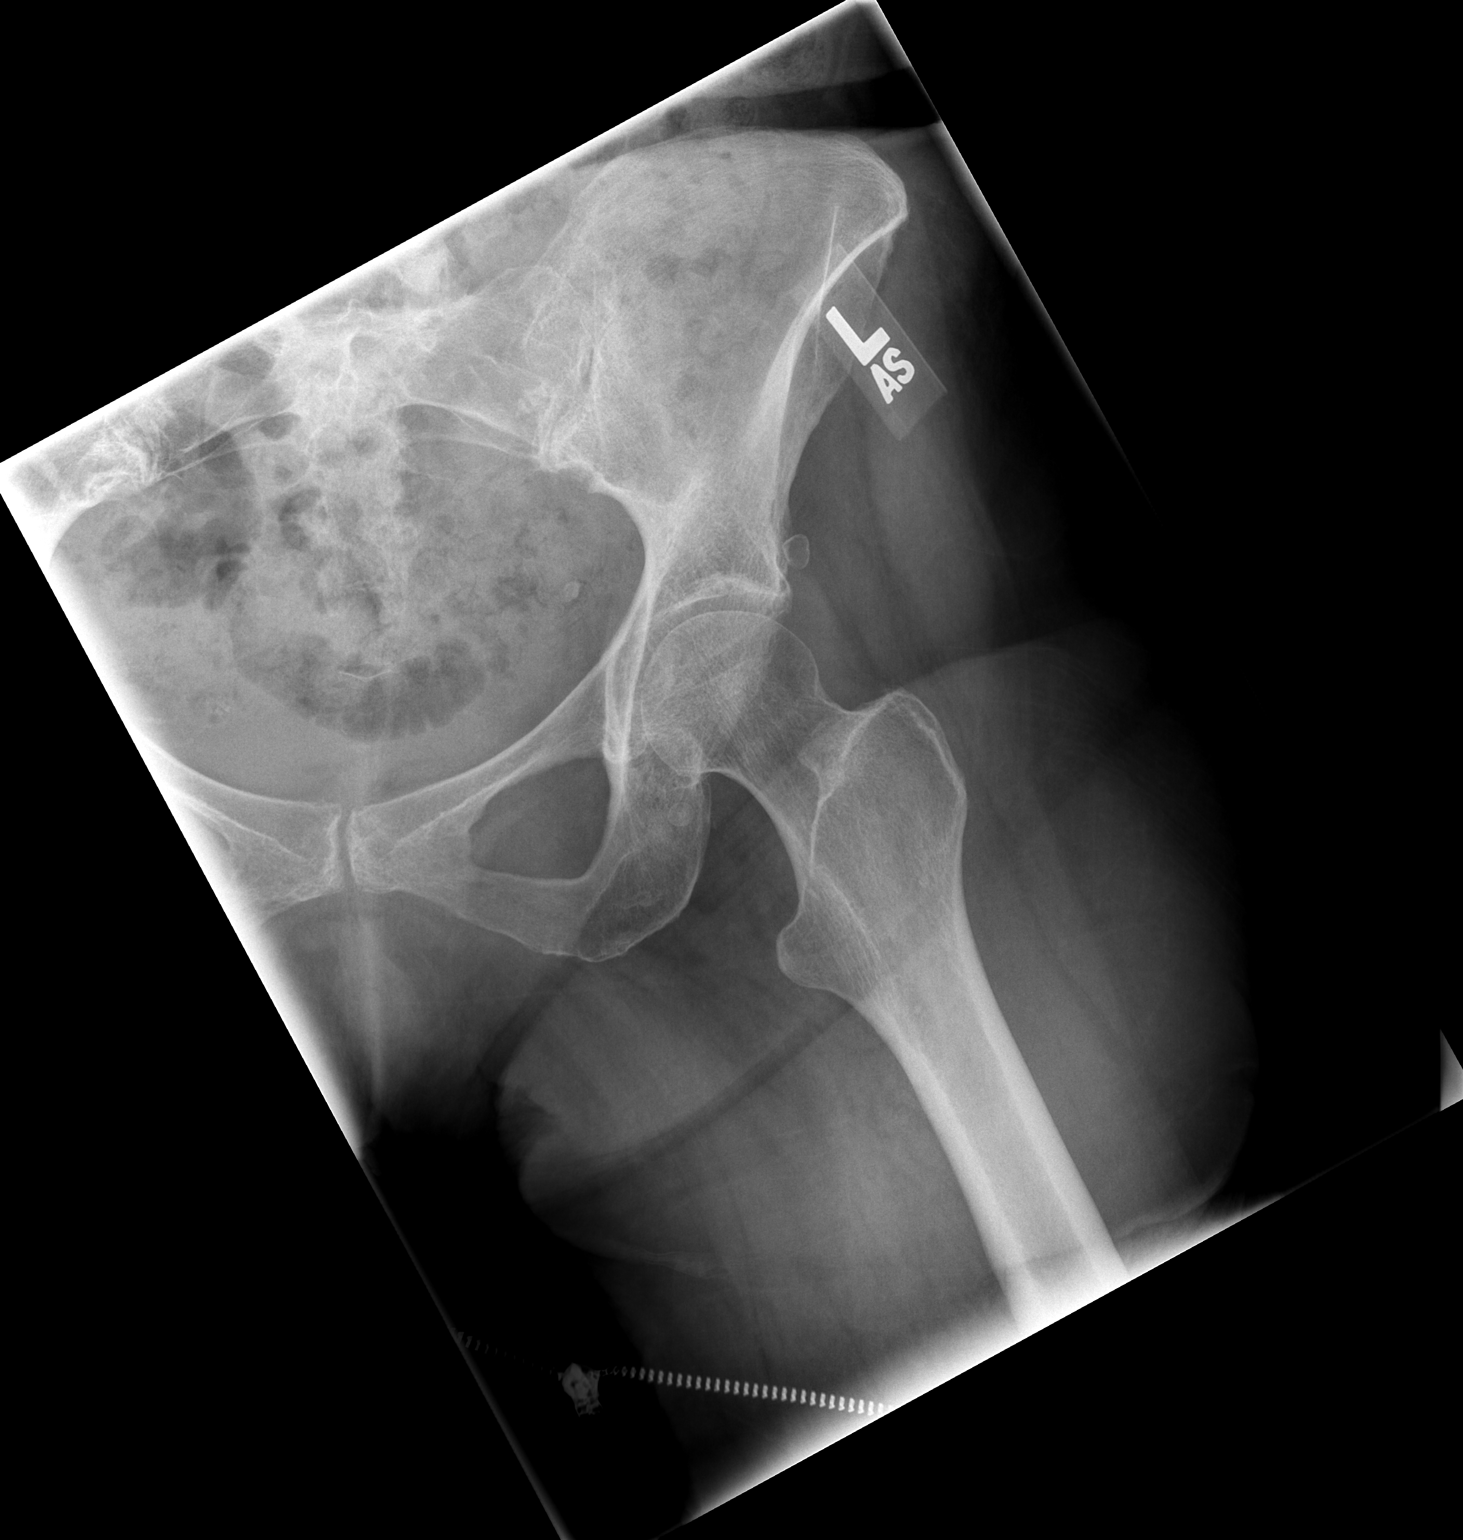

[2 of 2 positions shown; findings below may reference images not displayed]

FINDINGS: No acute abnormality is seen.  For age the left hip joint
space is within normal limits.  The left ramus is intact.  Left SI
joint appears normal.  There does appear to be some degenerative
change in the lower lumbar spine.
IMPRESSION: Negative left hip.

## 2010-08-19 ENCOUNTER — Encounter: Payer: Self-pay | Admitting: Internal Medicine

## 2010-08-19 ENCOUNTER — Ambulatory Visit: Payer: Self-pay | Admitting: Internal Medicine

## 2010-10-03 ENCOUNTER — Telehealth: Payer: Self-pay | Admitting: Cardiology

## 2010-11-04 ENCOUNTER — Ambulatory Visit: Payer: Self-pay | Admitting: Internal Medicine

## 2010-11-04 ENCOUNTER — Encounter: Payer: Self-pay | Admitting: Internal Medicine

## 2010-12-04 IMAGING — CR DG ABDOMEN ACUTE W/ 1V CHEST
3 series · 3 of 3 positions shown · non-contrast
Comparison: None.

CLINICAL DATA: Cough, nausea and vomiting.

ACUTE ABDOMEN SERIES (ABDOMEN 2 VIEW & CHEST 1 VIEW)

[w chest pa]
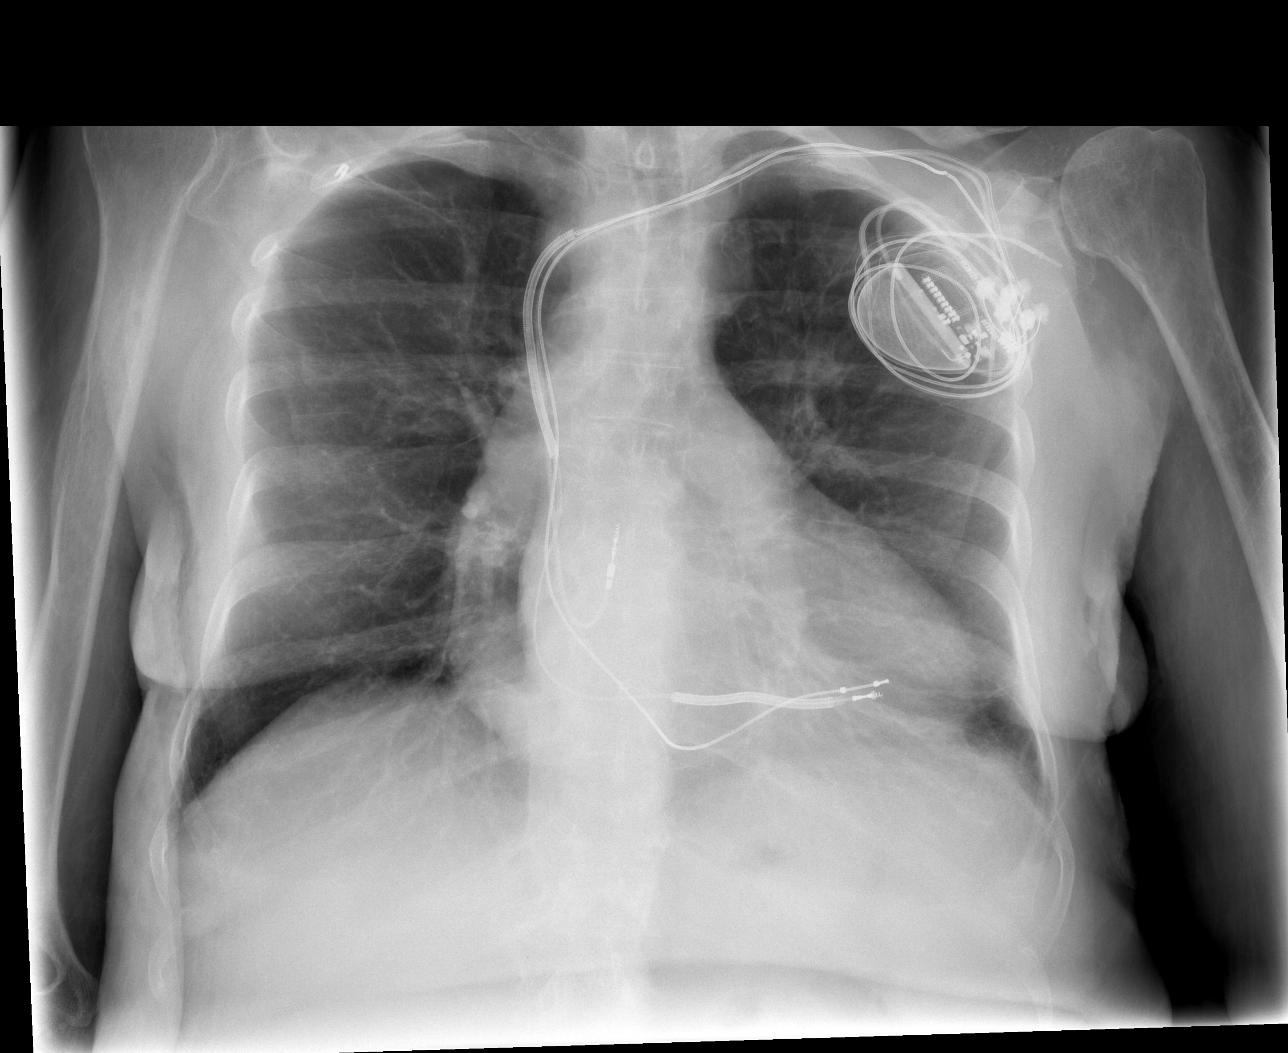

[w abdomen upright]
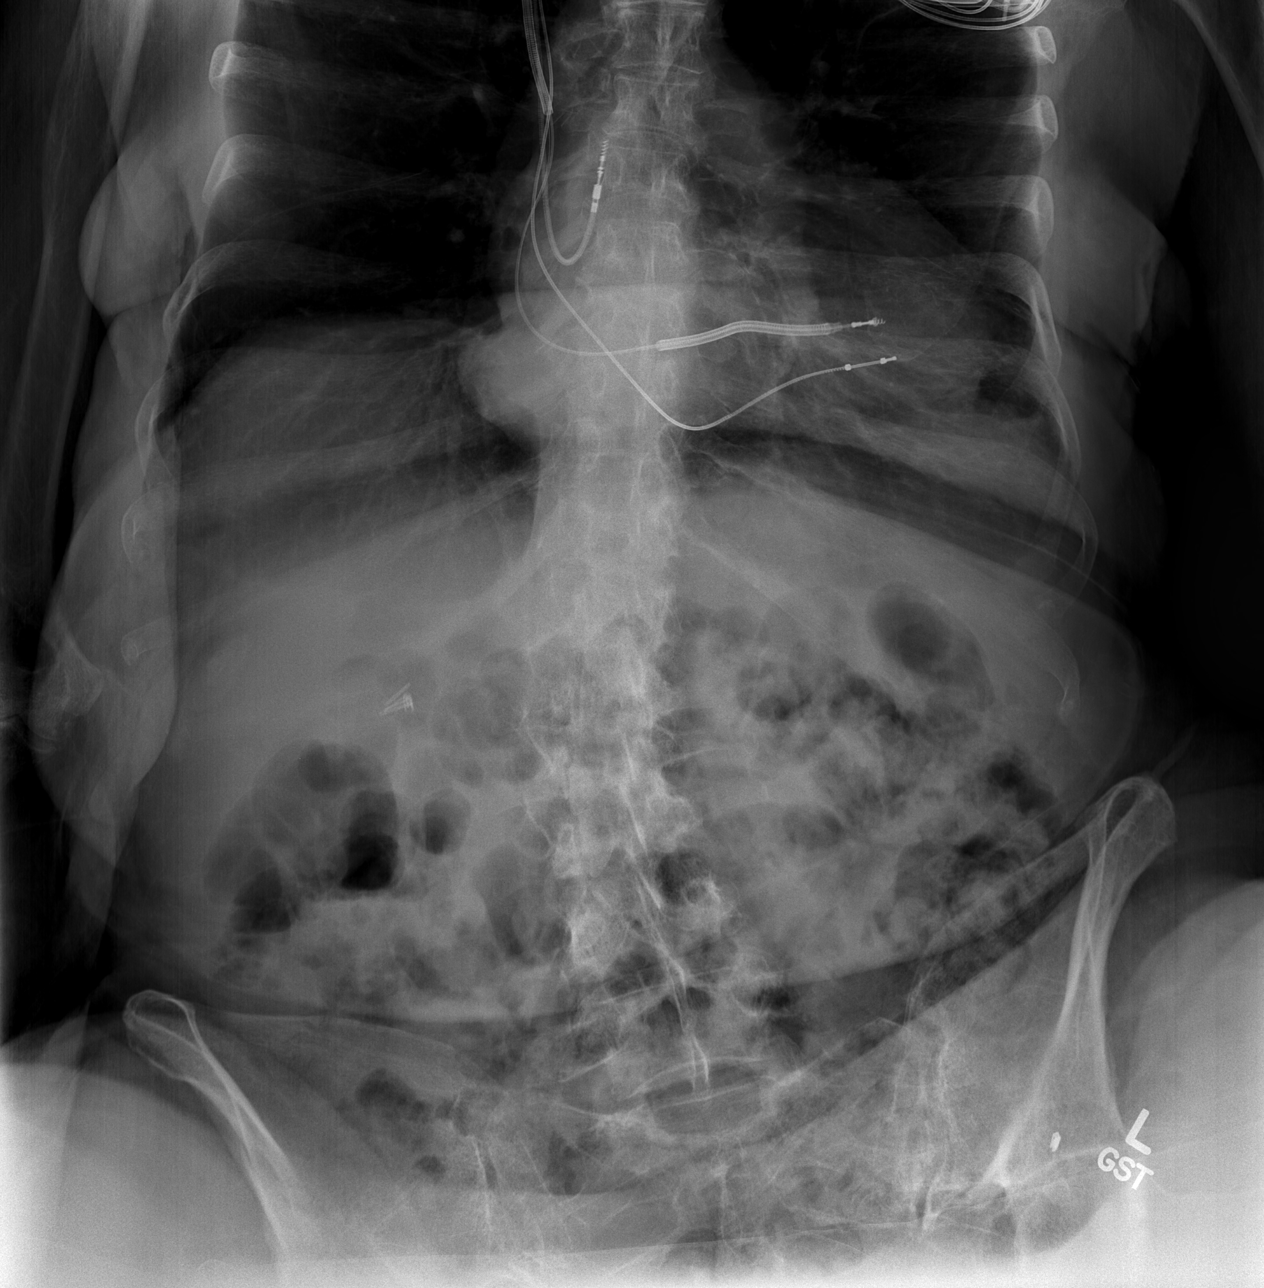

[t abdomen supine]
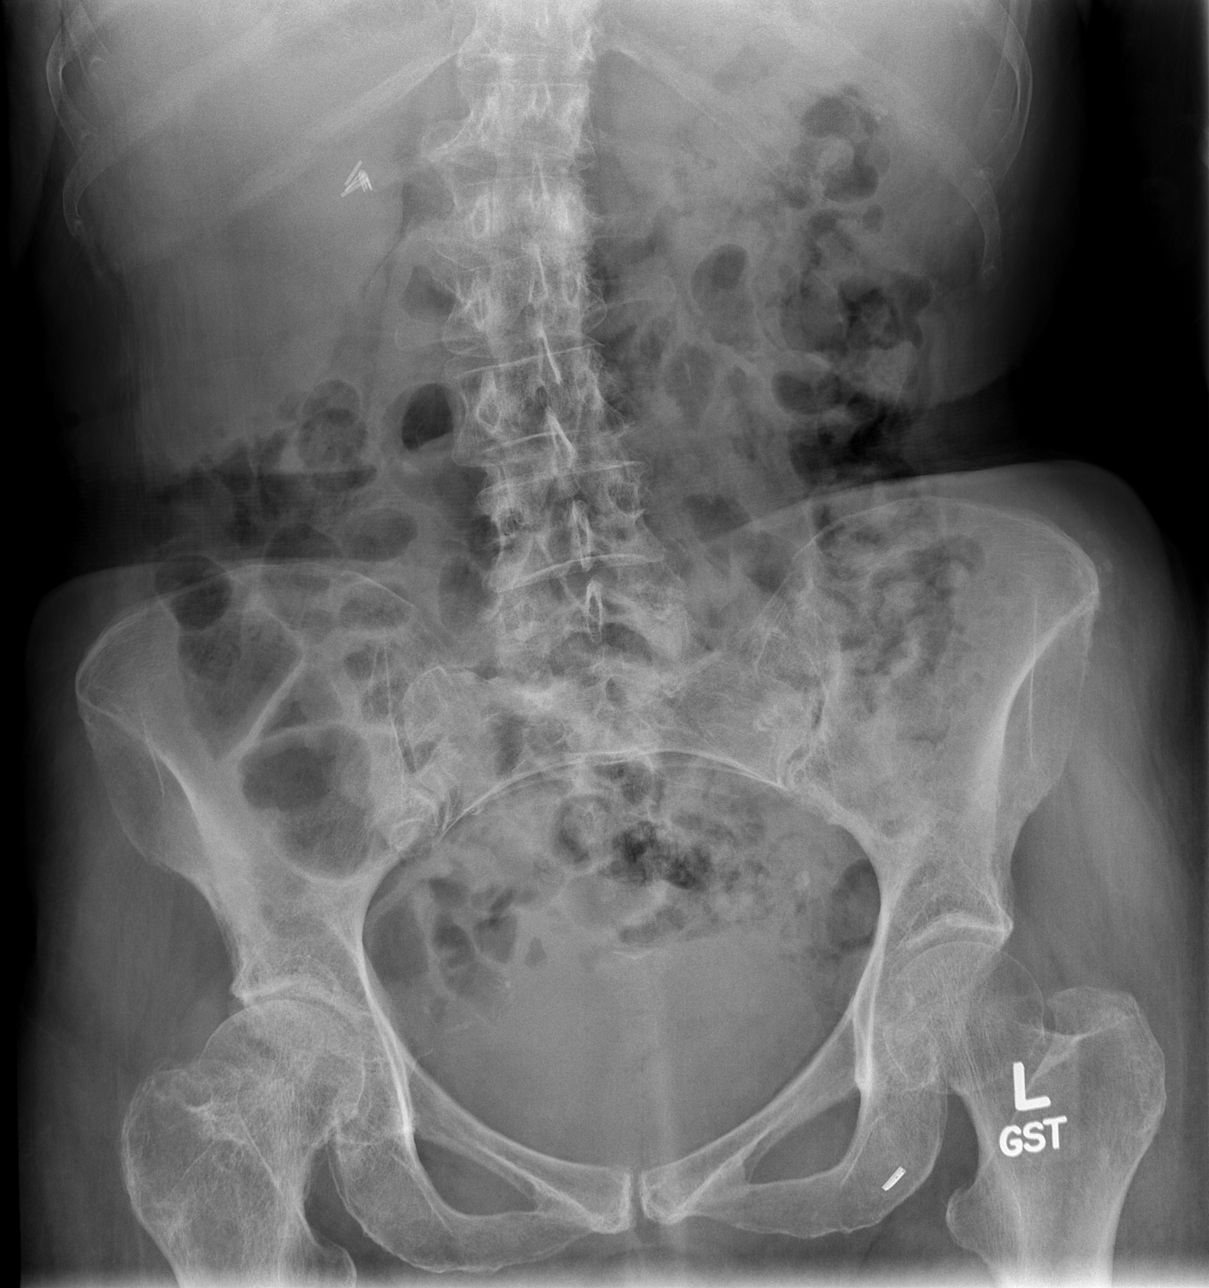

[3 of 3 positions shown; findings below may reference images not displayed]

FINDINGS: Single view of the chest demonstrates cardiomegaly but no
pulmonary edema.  There is mild left basilar atelectasis or scar.
Lungs otherwise clear.  AICD in place.

Two views of the abdomen show no free intraperitoneal air.  Bowel
gas pattern is nonspecific and nonobstructive.  No unexpected
calcification.
IMPRESSION: No acute finding chest or abdomen.

## 2010-12-07 ENCOUNTER — Encounter: Payer: Self-pay | Admitting: Cardiology

## 2010-12-07 ENCOUNTER — Telehealth: Payer: Self-pay | Admitting: Cardiology

## 2010-12-07 ENCOUNTER — Other Ambulatory Visit: Payer: Self-pay | Admitting: Cardiology

## 2010-12-07 ENCOUNTER — Ambulatory Visit
Admission: RE | Admit: 2010-12-07 | Discharge: 2010-12-07 | Payer: Self-pay | Source: Home / Self Care | Attending: Cardiology | Admitting: Cardiology

## 2010-12-07 LAB — BASIC METABOLIC PANEL
BUN: 20 mg/dL (ref 6–23)
CO2: 27 mEq/L (ref 19–32)
Calcium: 9.9 mg/dL (ref 8.4–10.5)
Chloride: 99 mEq/L (ref 96–112)
Creatinine, Ser: 0.6 mg/dL (ref 0.4–1.2)
GFR: 102.37 mL/min (ref >60.00)
Glucose, Bld: 84 mg/dL (ref 70–99)
Potassium: 3.9 mEq/L (ref 3.5–5.1)
Sodium: 138 mEq/L (ref 135–145)

## 2010-12-19 ENCOUNTER — Ambulatory Visit: Admit: 2010-12-19 | Payer: Self-pay | Admitting: Cardiology

## 2010-12-21 ENCOUNTER — Other Ambulatory Visit: Payer: Self-pay | Admitting: Cardiology

## 2010-12-21 ENCOUNTER — Encounter: Payer: Self-pay | Admitting: Cardiology

## 2010-12-21 ENCOUNTER — Ambulatory Visit
Admission: RE | Admit: 2010-12-21 | Discharge: 2010-12-21 | Payer: Self-pay | Source: Home / Self Care | Attending: Cardiology | Admitting: Cardiology

## 2010-12-22 LAB — BASIC METABOLIC PANEL
BUN: 21 mg/dL (ref 6–23)
CO2: 31 mEq/L (ref 19–32)
Calcium: 9.7 mg/dL (ref 8.4–10.5)
Chloride: 100 mEq/L (ref 96–112)
Creatinine, Ser: 0.9 mg/dL (ref 0.4–1.2)
GFR: 62.88 mL/min (ref >60.00)
Glucose, Bld: 95 mg/dL (ref 70–99)
Potassium: 4.4 mEq/L (ref 3.5–5.1)
Sodium: 139 mEq/L (ref 135–145)

## 2010-12-23 ENCOUNTER — Telehealth: Payer: Self-pay | Admitting: Cardiology

## 2010-12-25 ENCOUNTER — Encounter: Payer: Self-pay | Admitting: Internal Medicine

## 2010-12-29 ENCOUNTER — Encounter: Payer: Self-pay | Admitting: Internal Medicine

## 2011-01-03 NOTE — Procedures (Signed)
Summary: [facility]   Current Medications (verified): 1)  Carvedilol 12.5 Mg Tabs (Carvedilol) .... Take 1 Tab By Mouth Two Times A Day 2)  Vasotec 10 Mg Tabs (Enalapril Maleate) .... Two Times A Day 3)  Lasix 20 Mg Tabs (Furosemide) .... 2 Tabs Once Daily 4)  Nexium 20 Mg Cpdr (Esomeprazole Magnesium) .... Take 1 Tablet By Mouth Once Daily 5)  Simvastatin 40 Mg Tabs (Simvastatin) .Marland Kitchen.. 1 Tab At Bedtime 6)  Digoxin 0.125 Mg Tabs (Digoxin) .... Take 1 Tablet By Mouth Once Daily 7)  Potassium Chloride Crys Cr 20 Meq Cr-Tabs (Potassium Chloride Crys Cr) .... Take One Tablet By Mouth Daily 8)  Fish Oil 1000 Mg Caps (Omega-3 Fatty Acids) .... Take 2 By Mouth Two Times A Day 9)  Calcium 500 Mg Tabs (Calcium Carbonate) .... Take 1 By Mouth Once Daily 10)  Daily Multiple Vitamins  Tabs (Multiple Vitamin) 11)  Aspirin 81 Mg Tbec (Aspirin) .... Take One Tablet By Mouth Daily 12)  Tylenol Extra Strength 500 Mg Tabs (Acetaminophen) .... 2 Capsules 3 Times A Day 13)  Vitamin D (Ergocalciferol) 50000 Unit Caps (Ergocalciferol) 14)  Metoprolol Tartrate 25 Mg Tabs (Metoprolol Tartrate) .Marland Kitchen.. 1 Tab Two Times A Day  Allergies (verified): 1)  ! Codeine 2)  ! Morphine 3)  Darvocet-N 100   PPM Follow Up Remote Check?  No Battery Voltage:  GOOD V     Battery Est. Longevity:  8.5 YEARS     Pacer Dependent:  No       PPM Device Measurements Atrium  Amplitude: 1.1 mV, Impedance: 454 ohms, Threshold: 1.0 V at 0.4 msec Right Ventricle  Amplitude: 7.8 mV, Impedance: 679 ohms, Threshold: 1.0 V at 0.4 msec Left Ventricle  Amplitude: 3.6 mV, Impedance: 861 ohms, Threshold: 1.2 V at 0.4 msec Configuration: LV TIP TO LV RING  Episodes MS Episodes:  0     Ventricular High Rate:  0     Atrial Pacing:  10%     Ventricular Pacing:  96%  Parameters Mode:  DDD     Lower Rate Limit:  60     Upper Rate Limit:  120 Paced AV Delay:  150     Next Cardiology Appt Due:  05/04/2010 Tech Comments:  Wound check appt. Steri-strips  removed.  Wound without redness or edema.  Normal device function.  ROV 3 months SK. Gypsy Balsam RN BSN  March 02, 2010 12:25 PM   ICD Specifications Following MD:  Lewayne Bunting, MD     ICD Vendor:  [facility] [employer]     ICD Model Number:  H125     ICD Serial Number:  [id number] ICD DOI:  02/16/2010     ICD Implanting MD:  Lewayne Bunting, MD  Lead 1:    Location: RA     DOI: 01/04/2004     Model #: [id number]     Serial #: [id number]     Status: active Lead 2:    Location: RV     DOI: 01/04/2004     Model #: [id number]     Serial #: [id number]     Status: active Lead 3:    Location: LV     DOI: 01/04/2004     Model #: [id number]     Serial #: [id number]     Status: active  Indications::  ICM, CHF  Explantation Comments: 02/16/2010 [facility] [employer] Contak [id number] explanted.  ICD Follow Up ICD Dependent:  No      Episodes Coumadin:  No  Brady Parameters Mode DDD     Lower Rate Limit:  60     Upper Rate Limit 120 PAV 150     Sensed AV Delay:  150  Tachy Zones VF:  240     VT:  210     VT1:  170

## 2011-01-03 NOTE — Procedures (Signed)
Summary: PACER/AMD   Current Medications (verified): 1)  Carvedilol 12.5 Mg Tabs (Carvedilol) .... Take 1 Tab By Mouth Two Times A Day 2)  Vasotec 10 Mg Tabs (Enalapril Maleate) .... Two Times A Day 3)  Lasix 20 Mg Tabs (Furosemide) .... 3 Tab Once Daily 4)  Nexium 20 Mg Cpdr (Esomeprazole Magnesium) .... Take 1 Tablet By Mouth Once Daily 5)  Vytorin 10-40 Mg Tabs (Ezetimibe-Simvastatin) .Marland Kitchen.. 1tab At Bedtime 6)  Digoxin 0.125 Mg Tabs (Digoxin) .... Take 1 Tablet By Mouth Once Daily 7)  Potassium Chloride Crys Cr 20 Meq Cr-Tabs (Potassium Chloride Crys Cr) .... Take One Tablet By Mouth Daily 8)  Fish Oil 1000 Mg Caps (Omega-3 Fatty Acids) .... Take 2 By Mouth Two Times A Day 9)  Calcium 500 Mg Tabs (Calcium Carbonate) .... Take 1 By Mouth Once Daily 10)  Daily Multiple Vitamins  Tabs (Multiple Vitamin) 11)  Aspirin 81 Mg Tbec (Aspirin) .... Take One Tablet By Mouth Daily 12)  Tylenol Extra Strength 500 Mg Tabs (Acetaminophen) .... 2 Capsules 3 Times A Day 13)  Vitamin D (Ergocalciferol) 50000 Unit Caps (Ergocalciferol) .... As Needed  Allergies (verified): 1)  ! Codeine 2)  ! Morphine 3)  Darvocet-N 100   PPM Specifications Following MD:  Lewayne Bunting, MD     PPM Vendor:  [facility] [employer]     PPM Model Number:  [id number]     PPM Serial Number:  [id number] PPM DOI:  02/16/2010     PPM Implanting MD:  Lewayne Bunting, MD  Lead 1    Location: RA     DOI: 01/04/2004     Model #: [id number]     Serial #: [id number]     Status: active Lead 2    Location: RV     DOI: 01/04/2004     Model #: [id number]     Serial #: [id number]     Status: active Lead 3    Location: LV     DOI: 01/04/2004     Model #: [id number]     Serial #: [id number]     Status: active  Magnet Response Rate:  BOL 100 ERI 85  Indications:  ICM   PPM Follow Up Remote Check?  No Battery Voltage:  good V     Battery Est. Longevity:  BOL     Pacer Dependent:  No       PPM Device Measurements Atrium  Amplitude: 1.4 mV, Impedance: 436 ohms, Threshold: 0.2 V  at 0.4 msec Right Ventricle  Amplitude: 7.8 mV, Impedance: 646 ohms, Threshold: 1.2 V at 0.4 msec Left Ventricle  Amplitude: 4.5 mV, Impedance: 836 ohms, Threshold: 1.2 V at 0.8 msec Configuration: LV TIP TO LV RING  Episodes MS Episodes:  1     Percent Mode Switch:  0%     Coumadin:  No Atrial Pacing:  13%     Ventricular Pacing:  86%  Parameters Mode:  DDD     Lower Rate Limit:  60     Upper Rate Limit:  120 Paced AV Delay:  150     Tech Comments:  LV reprogrammed 2.4@0 .8.  Device function normal.  ROV 6 months with Dr. Ladona Ridgel in [place]. Altha Harm, LPN  November 04, 2010 2:57 PM   ICD Specifications Following MD:  Lewayne Bunting, MD     ICD Vendor:  [facility] [employer]     ICD Model Number:  [id number]     ICD Serial Number:  [id number] ICD  DOI:  02/16/2010     ICD Implanting MD:  Lewayne Bunting, MD  Lead 1:    Location: RA     DOI: 01/04/2004     Model #: 1610     Serial #: [id number]     Status: active Lead 2:    Location: RV     DOI: 01/04/2004     Model #: 1191     Serial #: [id number]     Status: active Lead 3:    Location: LV     DOI: 01/04/2004     Model #: 4518     Serial #: [id number]     Status: active  Indications::  ICM, CHF  Explantation Comments: 02/16/2010 [facility] Scientific Contak [id number] explanted.  ICD Follow Up Remote Check?  No ICD Dependent:  No      Episodes Coumadin:  No  Brady Parameters Mode DDD     Lower Rate Limit:  60     Upper Rate Limit 120 PAV 150     Sensed AV Delay:  150  Tachy Zones VF:  240     VT:  210     VT1:  170

## 2011-01-03 NOTE — Cardiovascular Report (Signed)
Summary: Office Visit  Office Visit   Imported By: Marylou Mccoy 05/28/2010 09:36:41  _____________________________________________________________________  External Attachment:    Type:   Image     Comment:   External Document

## 2011-01-03 NOTE — Progress Notes (Signed)
Summary: Questions  Phone Note Call from Patient Call back at Home Phone [phone]   Caller: Patient Call For: Dr. Ladona Ridgel Summary of Call: Patient had a device change out yesterday done at [facility] by Dr. Ladona Ridgel, her letter that she received with discharged instructions says to follow-up with her cardiologist in one week, she would like to know does she need to see Dr. Ladona Ridgel or Dr. Daleen Squibb?  She would like to have RN call her back and let her know what will be done at this appointment? Initial call taken by: [place],  February 17, 2010 8:50 AM  Follow-up for Phone Call        pt being seen in [facility].  Will forward. Melissa Howdeshell RN BSN  pt to be seen in device clinic for wound check on 02/23/10 at 3pm  pt aware Dennis Bast, RN, BSN  February 17, 2010 5:35 PM

## 2011-01-03 NOTE — Cardiovascular Report (Signed)
Summary: Pre Op Orders  Pre Op Orders   Imported By: Roderic Ovens 02/14/2010 10:55:20  _____________________________________________________________________  External Attachment:    Type:   Image     Comment:   External Document

## 2011-01-03 NOTE — Assessment & Plan Note (Signed)
Summary: 3 month rov./sl   Visit Type:  3 mo f/u Primary Provider:  [name] MD  CC:  sob w/walking....edema/legs though is better by morning time states pt....denies any cp.  History of Present Illness: [name] [name] returns today for evaluation and management of her chronic systolic heart failure and coronary artery disease.  She denies orthopnea, PND or increased edema. Her weight has been stable. She's had no angina or chest discomfort.  She very compliant with her medications. He had no discharges from her device. She denies any palpitations or presyncope.  Her biggest complaint is dyspnea on exertion particularly in the humid air and heat.  Current Medications (verified): 1)  Carvedilol 12.5 Mg Tabs (Carvedilol) .... Take 1 Tab By Mouth Two Times A Day 2)  Vasotec 10 Mg Tabs (Enalapril Maleate) .... Two Times A Day 3)  Lasix 20 Mg Tabs (Furosemide) .... 3 Tab Once Daily 4)  Nexium 20 Mg Cpdr (Esomeprazole Magnesium) .... Take 1 Tablet By Mouth Once Daily 5)  Vytorin 10-40 Mg Tabs (Ezetimibe-Simvastatin) .Marland Kitchen.. 1tab At Bedtime 6)  Digoxin 0.125 Mg Tabs (Digoxin) .... Take 1 Tablet By Mouth Once Daily 7)  Potassium Chloride Crys Cr 20 Meq Cr-Tabs (Potassium Chloride Crys Cr) .... Take One Tablet By Mouth Daily 8)  Fish Oil 1000 Mg Caps (Omega-3 Fatty Acids) .... Take 2 By Mouth Two Times A Day 9)  Calcium 500 Mg Tabs (Calcium Carbonate) .... Take 1 By Mouth Once Daily 10)  Daily Multiple Vitamins  Tabs (Multiple Vitamin) 11)  Aspirin 81 Mg Tbec (Aspirin) .... Take One Tablet By Mouth Daily 12)  Tylenol Extra Strength 500 Mg Tabs (Acetaminophen) .... 2 Capsules 3 Times A Day 13)  Vitamin D (Ergocalciferol) 50000 Unit Caps (Ergocalciferol)  Allergies: 1)  ! Codeine 2)  ! 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 Discussed DNR--she requests this ("if the Lord decided to take me, just let me go") Wouldn't want feeding tube if not cognitively aware. Asks that husband, then daughter Schotsi Marital Status:  Children:  Occupation:   Risk Factors: Smoking Status: never (14-Jan-2009)  Review of Systems       negative other than history of present illness  Vital Signs:  Patient profile:   77 year old female Height:      62 inches Weight:      133 pounds BMI:     24.41 Pulse rate:   68 / minute Pulse rhythm:   regular BP sitting:   100 / 70  (left arm) Cuff size:   large  Vitals Entered By: Danielle Rankin, CMA (July 05, 2010 9:17 AM)  Physical Exam  General:  elderly, no acute distress Head:  normocephalic and atraumatic Eyes:  PERRLA/EOM intact; conjunctiva and lids normal. Neck:  Neck supple, no JVD. No masses, thyromegaly or abnormal cervical nodes. Chest Wall:  no deformities or breast masses noted Lungs:  few crackles in the bases Heart:  PMI displaced inferior laterally,  regular rate and rhythm and, paradoxically split S2, no gallop Msk:  decreased ROM.   Pulses:  pulses normal in all 4 extremities Extremities:  trace left pedal edema and trace right pedal edema. superficial varicosities of the lower extremities but no sign of DVT  Neurologic:  Alert and oriented x 3. Skin:  ecchymoses of the upper extremity Psych:  Normal affect.    PPM Follow Up Pacer Dependent:  No     Configuration: LV TIP TO LV RING  Parameters Mode:  DDD     Lower Rate Limit:  60     Upper Rate Limit:  120 Paced AV Delay:  150      ICD Specifications Following MD:  Lewayne Bunting, MD     ICD Vendor:  [employer]     ICD Model Number:  H125     ICD Serial Number:  [id number] ICD DOI:  02/16/2010     ICD Implanting MD:  Lewayne Bunting, MD  Lead 1:    Location: RA     DOI: 01/04/2004     Model #: 8119     Serial #: [id number]     Status: active Lead 2:    Location: RV     DOI: 01/04/2004     Model #: 1308     Serial #: [id number]     Status: active Lead 3:    Location: LV     DOI: 01/04/2004     Model #: 9629     Serial #: [id number]     Status: active  Indications::  ICM, CHF  Explantation Comments: 02/16/2010 [facility] Scientific Contak [id number] explanted.  ICD Follow Up ICD Dependent:  No      Episodes Coumadin:  No  Brady Parameters Mode DDD     Lower Rate Limit:  60     Upper Rate Limit 120 PAV 150     Sensed AV Delay:  150  Tachy Zones VF:  240     VT:  210     VT1:  170     Impression & Recommendations:  Problem # 1:  CAD, NATIVE VESSEL (ICD-414.01) Assessment Unchanged  Her updated medication list for this problem includes:    Carvedilol 12.5 Mg Tabs (Carvedilol) .Marland Kitchen... Take 1 tab by mouth two times a day    Vasotec 10 Mg Tabs (Enalapril maleate) .Marland Kitchen..Marland Kitchen Two times a day    Aspirin 81 Mg Tbec (Aspirin) .Marland Kitchen... Take one tablet by mouth daily  Problem # 2:  LBBB (ICD-426.3)  Her updated medication list for this problem includes:    Carvedilol 12.5 Mg Tabs (Carvedilol) .Marland Kitchen...  Take 1 tab by mouth two times a day    Vasotec 10 Mg Tabs (Enalapril maleate) .Marland Kitchen..Marland Kitchen Two times a day    Aspirin 81 Mg Tbec (Aspirin) .Marland Kitchen... Take one tablet by mouth daily  Problem # 3:  SYSTOLIC HEART FAILURE, CHRONIC (ICD-428.22) Assessment: Unchanged  Her updated medication list for this problem includes:    Carvedilol 12.5 Mg Tabs (Carvedilol) .Marland Kitchen... Take 1 tab by mouth two times a day    Vasotec 10 Mg Tabs (Enalapril maleate) .Marland Kitchen..Marland Kitchen Two times a day  Lasix 20 Mg Tabs (Furosemide) .Marland KitchenMarland KitchenMarland KitchenMarland Kitchen 3 tab once daily    Digoxin 0.125 Mg Tabs (Digoxin) .Marland Kitchen... Take 1 tablet by mouth once daily    Aspirin 81 Mg Tbec (Aspirin) .Marland Kitchen... Take one tablet by mouth daily  Problem # 4:  PACEMAKER, PERMANENT/ BIVENTRICULAR (ICD-V45.01) Assessment: Unchanged  Problem # 5:  HYPERTENSION (ICD-401.9)  Her updated medication list for this problem includes:    Carvedilol 12.5 Mg Tabs (Carvedilol) .Marland Kitchen... Take 1 tab by mouth two times a day    Vasotec 10 Mg Tabs (Enalapril maleate) .Marland Kitchen..Marland Kitchen Two times a day    Lasix 20 Mg Tabs (Furosemide) .Marland KitchenMarland KitchenMarland KitchenMarland Kitchen 3 tab once daily    Aspirin 81 Mg Tbec (Aspirin) .Marland Kitchen... Take one tablet by mouth daily  Problem # 6:  HYPERLIPIDEMIA (ICD-272.4)  Her updated medication list for this problem includes:    Vytorin 10-40 Mg Tabs (Ezetimibe-simvastatin) .Marland Kitchen... 1tab at bedtime  Patient Instructions: 1)  Your physician recommends that you schedule a follow-up appointment in: 6 months with Dr. Daleen Squibb 2)  Your physician recommends that you continue on your current medications as directed. Please refer to the Current Medication list given to you today.

## 2011-01-03 NOTE — Progress Notes (Signed)
Summary: defib at [facility]  Phone Note [employer] back at [facility] Phone [phone]   Call placed by: Gypsy Balsam RN BSN,  January 27, 2010 10:00 AM Summary of Call: Called patient and left message on machine for patient to call.  According to Latitude transmission, device has reached ERI.  Per GT's last note, plan for CRT-P.  She was last seen 1-11.  Pt needs ROV with GT for H&P pre gen change. Gypsy Balsam RN BSN  January 27, 2010 10:01 AM   Follow-up for Phone Call        Spoke with patient.  Appt with Dr Ladona Ridgel 02-01-10 to discuss gen change.  Device will need to stay [employer] because of LV-1 lead. Gypsy Balsam RN BSN  January 27, 2010 12:58 PM

## 2011-01-03 NOTE — Cardiovascular Report (Signed)
Summary: Office Visit   Office Visit   Imported By: Roderic Ovens 03/11/2010 13:14:43  _____________________________________________________________________  External Attachment:    Type:   Image     Comment:   External Document

## 2011-01-03 NOTE — Progress Notes (Signed)
  Walk in Patient Form Recieved " Needs Refill on 4 meds please call when done" forwarded to  Message Nurse. [facility] [place]  February 02, 2010 9:25 AM

## 2011-01-03 NOTE — Assessment & Plan Note (Signed)
Summary: device/saf   Visit Type:  Pacemaker check Primary Provider:  [name] MD  CC:  no complaints.  History of Present Illness: [name] [name] returns today for PPM followup.  She is a pleasant elderly woman with a h/o chronic systolic CHF, LBBB, LV dysfunction and bradycardia.  She is s/p BiV PM.  She has had an improvement in her LV Function.  Her CHF symptoms are class 2.  No syncope.  She had been admitted with dehydration several weeks ago and is improved.  Current Medications (verified): 1)  Carvedilol 12.5 Mg Tabs (Carvedilol) .... Take 1 Tab By Mouth Two Times A Day 2)  Vasotec 10 Mg Tabs (Enalapril Maleate) .... Two Times A Day 3)  Lasix 20 Mg Tabs (Furosemide) .... 3 Tab Once Daily 4)  Nexium 20 Mg Cpdr (Esomeprazole Magnesium) .... Take 1 Tablet By Mouth Once Daily 5)  Vytorin 10-40 Mg Tabs (Ezetimibe-Simvastatin) .Marland Kitchen.. 1tab At Bedtime 6)  Digoxin 0.125 Mg Tabs (Digoxin) .... Take 1 Tablet By Mouth Once Daily 7)  Potassium Chloride Crys Cr 20 Meq Cr-Tabs (Potassium Chloride Crys Cr) .... Take One Tablet By Mouth Daily 8)  Fish Oil 1000 Mg Caps (Omega-3 Fatty Acids) .... Take 2 By Mouth Two Times A Day 9)  Calcium 500 Mg Tabs (Calcium Carbonate) .... Take 1 By Mouth Once Daily 10)  Daily Multiple Vitamins  Tabs (Multiple Vitamin) 11)  Aspirin 81 Mg Tbec (Aspirin) .... Take One Tablet By Mouth Daily 12)  Tylenol Extra Strength 500 Mg Tabs (Acetaminophen) .... 2 Capsules 3 Times A Day 13)  Vitamin D (Ergocalciferol) 50000 Unit Caps (Ergocalciferol)  Allergies: 1)  ! Codeine 2)  ! 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    Vital Signs:  Patient profile:   77 year old female Height:      62 inches Weight:      133 pounds BMI:     24.41 Pulse rate:   71 / minute BP sitting:   116 / 70  (left arm) Cuff size:   regular  Vitals Entered By: Bishop Dublin, CMA (May 10, 2010 11:12 AM)  Physical Exam  General:  Well developed, well nourished, in no acute distress. Head:  normocephalic and atraumatic Eyes:  PERRLA/EOM intact; conjunctiva and lids normal. Mouth:  no erythema and no lesions.   Neck:  Neck supple, no JVD. No masses, thyromegaly or abnormal cervical nodes. Chest Wall:  Well healed PM incision. Lungs:  Clear bilaterally to auscultation with no wheezes, rales, or rhonchi. Heart:  PMI inferiorly displaced, normal S1-S2, no gallop Abdomen:  soft and non-tender.   Msk:  Back normal, normal gait. Muscle strength and tone normal. Pulses:  pulses normal in all 4 extremities Extremities:  No clubbing or cyanosis. Neurologic:  Alert and oriented x 3.    PPM Follow Up Pacer Dependent:  No     Configuration: LV TIP TO LV RING  Parameters Mode:  DDD  Lower Rate Limit:  60     Upper Rate Limit:  120 Paced AV Delay:  150     MD Comments:  Normal BiV PPM function.  ICD Specifications Following MD:  Lewayne Bunting, MD     ICD Vendor:  [facility] [employer]     ICD Model Number:  H125     ICD Serial Number:  [id number] ICD DOI:  02/16/2010     ICD Implanting MD:  Lewayne Bunting, MD  Lead 1:    Location: RA     DOI: 01/04/2004     Model #: 8119     Serial #:  [id number]     Status: active Lead 2:    Location: RV     DOI: 01/04/2004     Model #: 1308     Serial #: [id number]     Status: active Lead 3:    Location: LV     DOI: 01/04/2004     Model #: 9629     Serial #: [id number]     Status: active  Indications::  ICM, CHF  Explantation Comments: 02/16/2010 [facility] Scientific Contak [id number] explanted.  ICD Follow Up Remote Check?  No Battery Voltage:  good V     Battery Est. Longevity:  8.5 years ICD Dependent:  No       ICD Device Measurements Atrium:  Amplitude: 1.5 mV, Impedance: 446 ohms, Threshold: 0.4 V at 0.4 msec Right Ventricle:  Amplitude: 8.1 mV, Impedance: 697 ohms, Threshold: 1.2 V at 0.4 msec Left Ventricle:  Amplitude: 4.5 mV, Impedance: 861 ohms, Threshold: 1.4 V at 0.4 msec  Episodes MS Episodes:  0     Percent Mode Switch:  0     Coumadin:  No Shock:  0     ATP:  0     Nonsustained:  2      Brady Parameters Mode DDD     Lower Rate Limit:  60     Upper Rate Limit 120 PAV 150     Sensed AV Delay:  150  Tachy Zones VF:  240     VT:  210     VT1:  170     Next Remote Date:  08/11/2010     Next Cardiology Appt Due:  02/02/2011 Tech Comments:  No parameter changes.  Device function normal. ROV 3 months  clinic .  ROV March 2012 with Dr. Ladona Ridgel in [place]. Altha Harm, LPN  May 11, 2439 11:41 AM   Impression & Recommendations:  Problem # 1:  PACEMAKER, PERMANENT/ BIVENTRICULAR (ICD-V45.01) Her new BiV PM is working normally.  Her CHF is well controlled.  Will recheck in several months.  Problem # 2:  SYSTOLIC HEART FAILURE, CHRONIC (ICD-428.22) Continue current meds and maintain a low sodium diet. The following medications were removed from the medication list:    Metoprolol Tartrate 25 Mg Tabs (Metoprolol tartrate) .Marland Kitchen... 1 tab two times a day Her updated medication list for this problem includes:    Carvedilol 12.5 Mg Tabs (Carvedilol) .Marland Kitchen... Take 1 tab by mouth two times a day    Vasotec 10 Mg Tabs (Enalapril  maleate) .Marland Kitchen..Marland Kitchen Two times a day    Lasix 20 Mg Tabs (Furosemide) .Marland KitchenMarland KitchenMarland KitchenMarland Kitchen 3 tab once daily    Digoxin 0.125 Mg Tabs (Digoxin) .Marland Kitchen... Take 1 tablet by mouth once daily    Aspirin 81 Mg Tbec (Aspirin) .Marland Kitchen... Take one tablet by mouth daily  Patient Instructions: 1)  Your physician recommends that you continue  on your current medications as directed. Please refer to the Current Medication list given to you today. 2)  Your physician wants you to follow-up in:   9 months You will receive a reminder letter in the mail two months in advance. If you don't receive a letter, please call our office to schedule the follow-up appointment.

## 2011-01-03 NOTE — Cardiovascular Report (Signed)
Summary: Office Visit Remote   Office Visit Remote   Imported By: Roderic Ovens 01/31/2010 11:14:25  _____________________________________________________________________  External Attachment:    Type:   Image     Comment:   External Document

## 2011-01-03 NOTE — Assessment & Plan Note (Signed)
Summary: pc2   Visit Type:  Pacemaker check Primary Provider:  [name] MD  CC:  SOB with exertion, edema, and no chest pains.  History of Present Illness: [name] [name] comes in today for followup of her nonischemic cardiomyopathy EF 25%, chronic systolic heart failure, history of left bundle branch block pattern, history of pacer and ICD placement, hypertension.  She's been doing well except for her neck and back pain. She denies any orthopnea, PND, peripheral edema, significant weight change, chest pain, palpitations.  She was considering back surgery but was ultimately turned down by her surgeon.  She has not had any intercurrent ICD therapy.  Current Problems (verified): 1)  Lbbb  (ICD-426.3) 2)  Systolic Heart Failure, Chronic  (ICD-428.22) 3)  Cad, Unspecified Site  (ICD-414.00) 4)  Cardiomyopathy, Primary  (ICD-425.4) 5)  Icd - in Situ  (ICD-V45.02) 6)  Hypertension  (ICD-401.9) 7)  Hyperlipidemia  (ICD-272.4) 8)  Urticaria  (ICD-708.9) 9)  Urinary Incontinence  (ICD-788.30) 10)  Osteoarthritis  (ICD-715.90) 11)  Gerd  (ICD-530.81) 12)  Spinal Stenosis, Lumbar  (ICD-724.02)  Current Medications (verified): 1)  Carvedilol 12.5 Mg Tabs (Carvedilol) .... Take 1 Tab By Mouth Two Times A Day 2)  Vasotec 10 Mg Tabs (Enalapril Maleate) .... Two Times A Day 3)  Lasix 20 Mg Tabs (Furosemide) .... 3 Tablets Once Daily 4)  Nexium 20 Mg Cpdr (Esomeprazole Magnesium) .... Take 1 Tablet By Mouth Once Daily 5)  Vytorin 10-40 Mg Tabs (Ezetimibe-Simvastatin) .... Take 1 Tablet By Mouth Once Daily 6)  Digoxin 0.125 Mg Tabs (Digoxin) .... Take 1 Tablet By Mouth Once Daily 7)  Potassium Chloride Crys Cr 20 Meq Cr-Tabs (Potassium Chloride Crys Cr) .... Take One Tablet By Mouth Daily 8)  Fish Oil 1000 Mg Caps (Omega-3 Fatty Acids) .... Take 2 By Mouth Two Times A Day 9)  Calcium 500 Mg Tabs (Calcium Carbonate) .... Take 1 By Mouth Once Daily 10)  Daily Multiple Vitamins  Tabs (Multiple  Vitamin) 11)  Aspirin 81 Mg Tbec (Aspirin) .... Take One Tablet By Mouth Daily 12)  Calcium/magnesium/zinc  Tabs (Multiple Minerals) 13)  Tylenol Extra Strength 500 Mg Tabs (Acetaminophen) .... 2 Capsules 3 Times A Day 14)  Vitamin D (Ergocalciferol) 50000 Unit Caps (Ergocalciferol)  Allergies (verified): 1)  ! Codeine 2)  ! 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  The patient denies chest pain and syncope.    Vital Signs:  Patient profile:   77 year old female Height:      62 inches Weight:      137.75 pounds Pulse rate:   68 / minute Pulse rhythm:   regular BP sitting:   112 / 70  (left arm) Cuff size:   regular  Vitals Entered By: Mercer Pod (December 13, 2009 9:54 AM)  Physical Exam  General:  Well developed, well nourished, in no acute distress. Head:  normocephalic and atraumatic Eyes:  PERRLA/EOM  intact; conjunctiva and lids normal. Mouth:  no erythema and no lesions.   Neck:  Neck supple, no JVD. No masses, thyromegaly or abnormal cervical nodes. Chest Wall:  Well healed ICD incision. Lungs:  Clear bilaterally to auscultation. No wheezes, rales, or rhonchi. Heart:  regular rate and rhythm, no gallop.  PMI is  enlarged and laterally displaced. Abdomen:  soft and non-tender.   Msk:  decreased ROM.   Pulses:  pulses normal in all 4 extremities Extremities:  No clubbing or cyanosis. Neurologic:  Alert and oriented x 3.    ICD Specifications Following MD:  Lewayne Bunting, MD     ICD Vendor:  [facility] [employer]     ICD Model Number:  [phone]     ICD Serial Number:  [id number] ICD DOI:  01/04/2004     ICD Implanting MD:  Lewayne Bunting, MD  Lead 1:    Location: RA     DOI: 01/04/2004     Model #: [id number]     Serial #: [id number]     Status: active Lead 2:    Location: RV     DOI: 01/04/2004     Model #: [id number]     Serial #: [id number]     Status: active Lead 3:    Location: LV     DOI: 01/04/2004     Model #: 3614     Serial #: [id number]     Status: active  Indications::  ICM, CHF   ICD Follow Up Remote Check?  No Battery Voltage:  2.51 V     Charge Time:  12.0 seconds     Battery Est. Longevity:  MOL2/2% Underlying rhythm:  SR@65  ICD Dependent:  No       ICD Device Measurements Atrium:  Amplitude: 1.6 mV, Impedance: 436 ohms, Threshold: 0.4 V at 0.4 msec Right Ventricle:  Amplitude: 9.6 mV, Impedance: 720 ohms, Threshold: 0.8 V at 0.4 msec Left Ventricle:  Amplitude: 6.0 mV, Impedance: 1001 ohms, Threshold: 1.0 V at 0.4 msec Shock Impedance: 41 ohms   Episodes MS Episodes:  1     Percent Mode Switch:  0%     Coumadin:  No Shock:  0     ATP:  0     Nonsustained:  0     Atrial Pacing:  12%     Ventricular Pacing:  98%  Brady Parameters Mode DDD     Lower Rate Limit:  60     Upper Rate Limit 120 PAV 150     Sensed AV Delay:  150  Tachy Zones VF:  240     VT:  210     VT1:  170     Next Remote Date:  03/10/2010     Next Cardiology Appt Due:  12/04/2010 Tech Comments:  No parameter changes.  Device function normal.  Battery near ERI.  Alert tones demonstated for the patient.  Latitude transmissions every 3 months.   ROV 1 year with Dr. Ladona Ridgel in [place]. [name] Harm, LPN  December 13, 2009 9:56 AM  MD  Comments:  Agree with above.  Impression & Recommendations:  Problem # 1:  SYSTOLIC HEART FAILURE, CHRONIC (ICD-428.22) She remains class 2.  Continue current meds.  A low sodium diet is recommended. Her updated medication list for this problem includes:    Carvedilol 12.5 Mg Tabs (Carvedilol) .Marland Kitchen... Take 1 tab by mouth two times a day    Vasotec 10 Mg Tabs (Enalapril maleate) .Marland Kitchen..Marland Kitchen Two times a  day    Lasix 20 Mg Tabs (Furosemide) .Marland KitchenMarland KitchenMarland KitchenMarland Kitchen 3 tablets once daily    Digoxin 0.125 Mg Tabs (Digoxin) .Marland Kitchen... Take 1 tablet by mouth once daily    Aspirin 81 Mg Tbec (Aspirin) .Marland Kitchen... Take one tablet by mouth daily  Problem # 2:  ICD - IN SITU (ICD-V45.02) Her device is working normally.  She is near ERI.  I will plan biv PPM when her device has reached ERI.  Problem # 3:  HYPERTENSION (ICD-401.9) Her blood pressure has been well controlled. Conitinue current meds. Her updated medication list for this problem includes:    Carvedilol 12.5 Mg Tabs (Carvedilol) .Marland Kitchen... Take 1 tab by mouth two times a day    Vasotec 10 Mg Tabs (Enalapril maleate) .Marland Kitchen..Marland Kitchen Two times a day    Lasix 20 Mg Tabs (Furosemide) .Marland KitchenMarland KitchenMarland KitchenMarland Kitchen 3 tablets once daily    Aspirin 81 Mg Tbec (Aspirin) .Marland Kitchen... Take one tablet by mouth daily  Patient Instructions: 1)  Your physician recommends that you schedule a follow-up appointment in: 1 year

## 2011-01-03 NOTE — Letter (Signed)
Summary: Implantable Device Instructions  Architectural technologist, Main Office  [address]   [place], [place] [address]   Phone: [phone]  Fax: [phone]      Implantable Device Instructions  You are scheduled for:  _____ Generator Change  on 02/16/10 with Dr. Ladona Ridgel.  1.  Please arrive at the Short Stay Center at [facility] at 8:00am on the day of your procedure.  2.  Do not eat or drink after midnight the night before your procedure.  3.  Complete lab work on 02/09/10.  The lab at [facility] is open from 8:30 AM to 1:30 PM and from 2:30 PM to 5:00 PM.   You do not have to be fasting.  4.  Do NOT take these medications for the days of your procedure:  Lasix.    5.  Plan for an overnight stay.  Bring your insurance cards and a list of your medications.  6.  Wash your chest and neck with antibacterial soap (any brand) the evening before and the morning of your procedure.  Rinse well.  *If you have ANY questions after you get home, please call the office [phone].  Anselm Pancoast  *Every attempt is made to prevent procedures from being rescheduled.  Due to the nauture of Electrophysiology, rescheduling can happen.  The physician is always aware and directs the staff when this occurs.

## 2011-01-03 NOTE — Procedures (Signed)
Summary: PC2/AMD   Current Medications (verified): 1)  Carvedilol 12.5 Mg Tabs (Carvedilol) .... Take 1 Tab By Mouth Two Times A Day 2)  Vasotec 10 Mg Tabs (Enalapril Maleate) .... Two Times A Day 3)  Lasix 20 Mg Tabs (Furosemide) .... 3 Tab Once Daily 4)  Nexium 20 Mg Cpdr (Esomeprazole Magnesium) .... Take 1 Tablet By Mouth Once Daily 5)  Vytorin 10-40 Mg Tabs (Ezetimibe-Simvastatin) .Marland Kitchen.. 1tab At Bedtime 6)  Digoxin 0.125 Mg Tabs (Digoxin) .... Take 1 Tablet By Mouth Once Daily 7)  Potassium Chloride Crys Cr 20 Meq Cr-Tabs (Potassium Chloride Crys Cr) .... Take One Tablet By Mouth Daily 8)  Fish Oil 1000 Mg Caps (Omega-3 Fatty Acids) .... Take 2 By Mouth Two Times A Day 9)  Calcium 500 Mg Tabs (Calcium Carbonate) .... Take 1 By Mouth Once Daily 10)  Daily Multiple Vitamins  Tabs (Multiple Vitamin) 11)  Aspirin 81 Mg Tbec (Aspirin) .... Take One Tablet By Mouth Daily 12)  Tylenol Extra Strength 500 Mg Tabs (Acetaminophen) .... 2 Capsules 3 Times A Day 13)  Vitamin D (Ergocalciferol) 50000 Unit Caps (Ergocalciferol) .... As Needed  Allergies (verified): 1)  ! Codeine 2)  ! Morphine 3)  Darvocet-N 100    PPM Follow Up Pacer Dependent:  No     Configuration: LV TIP TO LV RING  Parameters Mode:  DDD     Lower Rate Limit:  60     Upper Rate Limit:  120 Paced AV Delay:  150      ICD Specifications Following MD:  Lewayne Bunting, MD     ICD Vendor:  [employer]     ICD Model Number:  H125     ICD Serial Number:  [id number] ICD DOI:  02/16/2010     ICD Implanting MD:  Lewayne Bunting, MD  Lead 1:    Location: RA     DOI: 01/04/2004     Model #: 4782     Serial #: [id number]     Status: active Lead 2:    Location: RV     DOI: 01/04/2004     Model #: 5784     Serial #: [id number]     Status: active Lead 3:    Location: LV     DOI: 01/04/2004     Model #: 2841     Serial #: [id number]     Status: active  Indications::  ICM, CHF  Explantation Comments: 02/16/2010 [facility] [employer]ak [id number]  explanted.  ICD Follow Up Remote Check?  No Battery Voltage:  good V     Battery Est. Longevity:  8.5 years Underlying rhythm:  Brady ICD Dependent:  No       ICD Device Measurements Atrium:  Amplitude: 1.5 mV, Impedance: 436 ohms, Threshold: 0.2 V at 0.4 msec Right Ventricle:  Amplitude: 7.9 mV, Impedance: 679 ohms, Threshold: 1.2 V at 0.4 msec Left Ventricle:  Amplitude: 4.9 mV, Impedance: 861 ohms, Threshold: 1.4 V at 0.4 msec  Episodes MS Episodes:  0     Percent Mode Switch:  0     Coumadin:  No Shock:  0     ATP:  0     Nonsustained:  0     Atrial Pacing:  16%     Ventricular Pacing:  81%  Brady Parameters Mode DDD     Lower Rate Limit:  60     Upper Rate Limit 120 PAV 150     Sensed AV Delay:  150  Tachy Zones VF:  240     VT:  210     VT1:  170     Next Cardiology Appt Due:  11/03/2010 Tech Comments:  No parameter changes.  Device function normal.  1.56M single or double PVC's.  ROV 3 months [place] office. Altha Harm, LPN  August 19, 2010 3:34 PM

## 2011-01-03 NOTE — Progress Notes (Signed)
Summary: HAS MED QUESTIONS  Phone Note Call from Patient   Caller: [phone] Reason for Call: Talk to Nurse Summary of Call: pt has questions re med Initial call taken by: Glynda Jaeger,  October 03, 2010 9:26 AM  Follow-up for Phone Call        [name] [name] calls b/c she is out of her Carvedilol. Pharmacy will call pt this am when med ready. [name] RN    Prescriptions: CARVEDILOL 12.5 MG TABS (CARVEDILOL) take 1 tab by mouth two times a day  #60 x 1   Entered by:   Lisabeth Devoid RN   Authorized by:   Gaylord Shih, MD, [facility]   Signed by:   Lisabeth Devoid RN on 10/03/2010   Method used:   Electronically to        [facility]. [phone]* (retail)       [address]       [place], [place]  [address]       Ph: [phone]       Fax: [phone]   RxID:   [id number] CARVEDILOL 12.5 MG TABS (CARVEDILOL) take 1 tab by mouth two times a day  #180 x 3   Entered by:   Lisabeth Devoid RN   Authorized by:   Gaylord Shih, MD, [facility]   Signed by:   Lisabeth Devoid RN on 10/03/2010   Method used:   Faxed to ...       [facility] Environmental education officer)       [address] [address]       [place], [place]  [address]       Ph: [phone]       Fax: [phone]   RxID:   [id number] CARVEDILOL 12.5 MG TABS (CARVEDILOL) take 1 tab by mouth two times a day  #90 x 3   Entered by:   Lisabeth Devoid RN   Authorized by:   Gaylord Shih, MD, [facility]   Signed by:   Lisabeth Devoid RN on 10/03/2010   Method used:   Faxed to ...       [facility] Environmental education officer)       [address] [address]       [place], [place]  [address]       Ph: [phone]       Fax: [phone]   RxID:   [id number] CARVEDILOL 12.5 MG TABS (CARVEDILOL) take 1 tab by mouth two times a day  #30 x 5   Entered by:   Lisabeth Devoid RN   Authorized by:   Gaylord Shih, MD, [facility]   Signed by:   Lisabeth Devoid RN on 10/03/2010   Method used:   Electronically to        [facility]. [phone]* (retail)       [address]       [place], [place]  [address]       Ph: [phone]       Fax: [phone]   RxID:   [id number]

## 2011-01-03 NOTE — Miscellaneous (Signed)
Summary: Device change out  Clinical Lists Changes  Observations: Added new observation of ICD IMPL DTE: 02/16/2010 (02/17/2010 14:11) Added new observation of ICD SERL#: [id number]  (02/17/2010 14:11) Added new observation of ICD MODL#: H125  (02/17/2010 14:11) Added new observation of ICDEXPLCOMM: 02/16/2010 [employer] [id number] explanted.  (02/17/2010 14:11)       ICD Specifications Following MD:  Lewayne Bunting, MD     ICD Vendor:  [facility] [employer]     ICD Model Number:  H125     ICD Serial Number:  [id number] ICD DOI:  02/16/2010     ICD Implanting MD:  Lewayne Bunting, MD  Lead 1:    Location: RA     DOI: 01/04/2004     Model #: 7829     Serial #: [id number]     Status: active Lead 2:    Location: RV     DOI: 01/04/2004     Model #: 7846     Serial #: [id number]     Status: active Lead 3:    Location: LV     DOI: 01/04/2004     Model #: 8413     Serial #: [id number]     Status: active  Indications::  ICM, CHF  Explantation Comments: 02/16/2010 [facility] [employer] Contak [id number] explanted.  ICD Follow Up ICD Dependent:  No      Episodes Coumadin:  No  Brady Parameters Mode DDD     Lower Rate Limit:  60     Upper Rate Limit 120 PAV 150     Sensed AV Delay:  150  Tachy Zones VF:  240     VT:  210     VT1:  170

## 2011-01-03 NOTE — Assessment & Plan Note (Signed)
Summary: device at eri/amber   Primary Provider:  [name] MD   History of Present Illness: [name] [name] comes in today for followup of her nonischemic cardiomyopathy EF 25%, chronic systolic heart failure, history of left bundle branch block pattern, history of BiV ICD placement, hypertension.  She's been doing well except for her neck and back pain. She denies any orthopnea, PND, peripheral edema, significant weight change, chest pain, palpitations.  She has not had any intercurrent ICD therapy. Review of her histograms reveals no ICD therapies.  Current Medications (verified): 1)  Carvedilol 12.5 Mg Tabs (Carvedilol) .... Take 1 Tab By Mouth Two Times A Day 2)  Vasotec 10 Mg Tabs (Enalapril Maleate) .... Two Times A Day 3)  Lasix 20 Mg Tabs (Furosemide) .... 3 Tablets Once Daily 4)  Nexium 20 Mg Cpdr (Esomeprazole Magnesium) .... Take 1 Tablet By Mouth Once Daily 5)  Vytorin 10-40 Mg Tabs (Ezetimibe-Simvastatin) .... Take 1 Tablet By Mouth Once Daily 6)  Digoxin 0.125 Mg Tabs (Digoxin) .... Take 1 Tablet By Mouth Once Daily 7)  Potassium Chloride Crys Cr 20 Meq Cr-Tabs (Potassium Chloride Crys Cr) .... Take One Tablet By Mouth Daily 8)  Fish Oil 1000 Mg Caps (Omega-3 Fatty Acids) .... Take 2 By Mouth Two Times A Day 9)  Calcium 500 Mg Tabs (Calcium Carbonate) .... Take 1 By Mouth Once Daily 10)  Daily Multiple Vitamins  Tabs (Multiple Vitamin) 11)  Aspirin 81 Mg Tbec (Aspirin) .... Take One Tablet By Mouth Daily 12)  Tylenol Extra Strength 500 Mg Tabs (Acetaminophen) .... 2 Capsules 3 Times A Day 13)  Vitamin D (Ergocalciferol) 50000 Unit Caps (Ergocalciferol)  Allergies (verified): 1)  ! Codeine 2)  ! 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  The patient denies chest pain, syncope, and peripheral edema.    Vital Signs:  Patient profile:   77 year old female Height:      62 inches Weight:      136 pounds Pulse rate:   70 / minute Resp:     16 per minute BP sitting:   120 / 68  (right arm)  Vitals Entered By: Marrion Coy, CNA (February 01, 2010 2:21 PM)  Physical Exam  General:  Well developed, well nourished, in no acute distress. Head:  normocephalic and atraumatic Eyes:  PERRLA/EOM intact; conjunctiva and lids normal. Mouth:  no erythema and no lesions.   Neck:  Neck supple, no JVD. No masses, thyromegaly or abnormal cervical nodes. Chest Wall:  Well healed ICD incision. Lungs:  Clear bilaterally to auscultation. No wheezes, rales, or rhonchi. Heart:  regular rate and rhythm, no gallop.  PMI is enlarged and laterally displaced. Abdomen:  soft and non-tender.   Msk:  decreased ROM.   Pulses:  pulses normal in all 4 extremities Extremities:  No clubbing or cyanosis. Neurologic:  Alert and oriented x 3.    ICD Specifications Following MD:  Lewayne Bunting, MD  ICD Vendor:  [employer]     ICD Model Number:  H175     ICD Serial Number:  [id number] ICD DOI:  01/04/2004     ICD Implanting MD:  Lewayne Bunting, MD  Lead 1:    Location: RA     DOI: 01/04/2004     Model #: 0454     Serial #: [id number]     Status: active Lead 2:    Location: RV     DOI: 01/04/2004     Model #: 8295     Serial #: [id number]     Status:  active Lead 3:    Location: LV     DOI: 01/04/2004     Model #: 6578     Serial #: [id number]     Status: active  Indications::  ICM, CHF   ICD Follow Up ICD Dependent:  No      Episodes Coumadin:  No  Brady Parameters Mode DDD     Lower Rate Limit:  60     Upper Rate Limit 120 PAV 150     Sensed AV Delay:  150  Tachy Zones VF:  240     VT:  210     VT1:  170     Tech Comments:  Device at ERI, per Dr Lubertha Basque last note, plan change out to CRT-P.  No therapy from this device.  Only has had 1 NSVT episode since implant. Plan per GT. Gypsy Balsam RN BSN  February 01, 2010 2:32 PM  MD Comments:  Agree with above.  Impression & Recommendations:  Problem # 1:  ICD - IN SITU (ICD-V45.02) Her device has reached ERI.  I have recommended device removal and insertion of a BiV PPM. With her LV-1 lead a [employer] device will be recommended. I have explained the differences in her current device and the device that will be changed out with the paitent and her husband.  Problem # 2:  SYSTOLIC HEART FAILURE, CHRONIC (ICD-428.22) A low sodium diet is recommended. Continue current meds. Her updated medication list for this problem includes:    Carvedilol 12.5 Mg Tabs (Carvedilol) .Marland Kitchen... Take 1 tab by mouth two times a day    Vasotec 10 Mg Tabs (Enalapril maleate) .Marland Kitchen..Marland Kitchen Two times a day    Lasix 20 Mg Tabs (Furosemide) .Marland KitchenMarland KitchenMarland KitchenMarland Kitchen 3 tablets once daily    Digoxin 0.125 Mg Tabs (Digoxin) .Marland Kitchen... Take 1 tablet by mouth once daily    Aspirin 81 Mg Tbec (Aspirin) .Marland Kitchen... Take one tablet by mouth daily  Problem # 3:  HYPERTENSION (ICD-401.9) A low sodium diet is recommended. Her updated medication list for this problem includes:    Carvedilol 12.5 Mg Tabs (Carvedilol) .Marland Kitchen... Take 1 tab by mouth two times a day    Vasotec 10 Mg Tabs (Enalapril maleate) .Marland Kitchen..Marland Kitchen Two times a day    Lasix 20 Mg Tabs (Furosemide) .Marland KitchenMarland KitchenMarland KitchenMarland Kitchen 3 tablets once daily    Aspirin 81 Mg Tbec (Aspirin) .Marland Kitchen... Take one tablet by mouth daily

## 2011-01-03 NOTE — Assessment & Plan Note (Signed)
Summary: rov /per pt call/lg   Visit Type:  EPH Primary Provider:  [name] MD  CC:  no cardiac complaints today..pt states she had a new battery placed in her Pacemaker back in March.  History of Present Illness: Mrs Duvall times a day for close followup post hospitalization. She was admitted with diarrhea, dehydration, and presyncope.  During her hospital stay she had some nonsustained ventricular tachycardia.  Echocardiogram on March 10, 2000 showed an EF of 50%, diastolic dysfunction grade 2, mild aortic regurgitation, moderate mitral regurgitation with a severely calcified annulus, moderate left atrial enlargement, normal right sided function with mild right atrial enlargement, and pulmonary systolic pressure of 44-48 mm of mercury.  Since discharge, she has done well. She denies any orthostatic symptoms, presyncope or syncope.  She had a device change out for end-of-life generator issues by Dr. Ladona Ridgel since my last visit with her last fall. She had a biventricular device placed. I do not see where this is a defibrillator.  She denies any change in weight, orthopnea, PND or edema.  Current Medications (verified): 1)  Carvedilol 12.5 Mg Tabs (Carvedilol) .... Take 1 Tab By Mouth Two Times A Day 2)  Vasotec 10 Mg Tabs (Enalapril Maleate) .... Two Times A Day 3)  Lasix 20 Mg Tabs (Furosemide) .... 2 Tabs Once Daily 4)  Nexium 20 Mg Cpdr (Esomeprazole Magnesium) .... Take 1 Tablet By Mouth Once Daily 5)  Simvastatin 40 Mg Tabs (Simvastatin) .Marland Kitchen.. 1 Tab At Bedtime 6)  Digoxin 0.125 Mg Tabs (Digoxin) .... Take 1 Tablet By Mouth Once Daily 7)  Potassium Chloride Crys Cr 20 Meq Cr-Tabs (Potassium Chloride Crys Cr) .... Take One Tablet By Mouth Daily 8)  Fish Oil 1000 Mg Caps (Omega-3 Fatty Acids) .... Take 2 By Mouth Two Times A Day 9)  Calcium 500 Mg Tabs (Calcium Carbonate) .... Take 1 By Mouth Once Daily 10)  Daily Multiple Vitamins  Tabs (Multiple Vitamin) 11)  Aspirin 81 Mg  Tbec (Aspirin) .... Take One Tablet By Mouth Daily 12)  Tylenol Extra Strength 500 Mg Tabs (Acetaminophen) .... 2 Capsules 3 Times A Day 13)  Vitamin D (Ergocalciferol) 50000 Unit Caps (Ergocalciferol) 14)  Metoprolol Tartrate 25 Mg Tabs (Metoprolol Tartrate) .Marland Kitchen.. 1 Tab Two Times A Day  Allergies: 1)  ! Codeine 2)  ! 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 Discussed DNR--she requests this ("if the Lord decided to take me, just let me go") Wouldn't want feeding tube if not cognitively aware. Asks that husband, then daughter Schotsi Marital Status:  Children:  Occupation:   Risk Factors: Smoking Status: never (12-21-08)  Past Medical History: PACEMAKER, PERMANENT/ BIVENTRICULAR (ICD-V45.01) CAD, NATIVE VESSEL (ICD-414.01) LBBB (ICD-426.3) SYSTOLIC HEART FAILURE, CHRONIC (ICD-428.22) CARDIOMYOPATHY, PRIMARY (ICD-425.4) HYPERTENSION (ICD-401.9) HYPERLIPIDEMIA (ICD-272.4) URTICARIA (ICD-708.9) URINARY INCONTINENCE (ICD-788.30) OSTEOARTHRITIS (ICD-715.90) GERD (ICD-530.81) SPINAL STENOSIS, LUMBAR (ICD-724.02)  Review of Systems  negative other than history of present illness  Vital Signs:  Patient profile:   77 year old female Height:      62 inches Weight:      135 pounds BMI:     24.78 Pulse rate:   60 /  minute Pulse rhythm:   regular BP sitting:   128 / 80  (left arm) Cuff size:   large  Vitals Entered By: Danielle Rankin, CMA (Apr 05, 2010 11:10 AM)  Physical Exam  General:  Well developed, well nourished, in no acute distress. Head:  normocephalic and atraumatic Eyes:  PERRLA/EOM intact; conjunctiva and lids normal. Neck:  Neck supple, no JVD. No masses, thyromegaly or abnormal cervical nodes. Lungs:  Clear bilaterally to auscultation and percussion. Heart:  PMI inferiorly displaced, normal S1-S2, no gallop Msk:  Back normal, normal gait. Muscle strength and tone normal. Pulses:  pulses normal in all 4 extremities Extremities:  No clubbing or cyanosis. Neurologic:  Alert and oriented x 3. Skin:  Intact without lesions or rashes. Psych:  Normal affect.   Problems:  Medical Problems Added: 1)  Dx of Pacemaker, Permanent/ Biventricular  (ICD-V45.01)   PPM Follow Up Pacer Dependent:  No     Configuration: LV TIP TO LV RING  Parameters Mode:  DDD     Lower Rate Limit:  60     Upper Rate Limit:  120 Paced AV Delay:  150      ICD Specifications Following MD:  Lewayne Bunting, MD     ICD Vendor:  [employer]     ICD Model Number:  H125     ICD Serial Number:  [id number] ICD DOI:  02/16/2010     ICD Implanting MD:  Lewayne Bunting, MD  Lead 1:    Location: RA     DOI: 01/04/2004     Model #: [id number]     Serial #: [id number]     Status: active Lead 2:    Location: RV     DOI: 01/04/2004     Model #: 0626     Serial #: [id number]     Status: active Lead 3:    Location: LV     DOI: 01/04/2004     Model #: 2703     Serial #: [id number]     Status: active  Indications::  ICM, CHF  Explantation Comments: 02/16/2010 [facility] Scientific Contak [id number] explanted.  ICD Follow Up ICD Dependent:  No      Episodes Coumadin:  No  Brady Parameters Mode DDD     Lower Rate Limit:  60     Upper Rate Limit 120 PAV 150     Sensed AV Delay:  150  Tachy Zones VF:  240     VT:  210     VT1:  170      Impression & Recommendations:  Problem # 1:  SYSTOLIC HEART FAILURE, CHRONIC (ICD-428.22) Assessment Unchanged  Her updated medication list for this problem includes:    Carvedilol 12.5 Mg Tabs (Carvedilol) .Marland Kitchen... Take 1 tab by mouth two times a day    Vasotec 10 Mg Tabs (Enalapril maleate) .Marland Kitchen..Marland Kitchen Two times a day    Lasix 20 Mg Tabs (Furosemide) .Marland Kitchen... 2 tabs once daily    Digoxin 0.125 Mg Tabs (Digoxin) .Marland Kitchen... Take 1 tablet by mouth once daily    Aspirin 81 Mg Tbec (Aspirin) .Marland Kitchen... Take one tablet by mouth daily    Metoprolol Tartrate 25 Mg Tabs (Metoprolol tartrate) .Marland KitchenMarland KitchenMarland KitchenMarland Kitchen  1 tab two times a day  Problem # 2:  CAD, NATIVE VESSEL (ICD-414.01) Assessment: Unchanged  Her updated medication list for this problem includes:    Carvedilol 12.5 Mg Tabs (Carvedilol) .Marland Kitchen... Take 1 tab by mouth two times a day    Vasotec 10 Mg Tabs (Enalapril maleate) .Marland Kitchen..Marland Kitchen Two times a day    Aspirin 81 Mg Tbec (Aspirin) .Marland Kitchen... Take one tablet by mouth daily    Metoprolol Tartrate 25 Mg Tabs (Metoprolol tartrate) .Marland Kitchen... 1 tab two times a day  Orders: EKG w/ Interpretation (93000)  Problem # 3:  LBBB (ICD-426.3) Assessment: Unchanged  Her updated medication list for this problem includes:    Carvedilol 12.5 Mg Tabs (Carvedilol) .Marland Kitchen... Take 1 tab by mouth two times a day    Vasotec 10 Mg Tabs (Enalapril maleate) .Marland Kitchen..Marland Kitchen Two times a day    Aspirin 81 Mg Tbec (Aspirin) .Marland Kitchen... Take one tablet by mouth daily    Metoprolol Tartrate 25 Mg Tabs (Metoprolol tartrate) .Marland Kitchen... 1 tab two times a day  Problem # 4:  CARDIOMYOPATHY, PRIMARY (ICD-425.4) Assessment: Unchanged  Her updated medication list for this problem includes:    Carvedilol 12.5 Mg Tabs (Carvedilol) .Marland Kitchen... Take 1 tab by mouth two times a day    Vasotec 10 Mg Tabs (Enalapril maleate) .Marland Kitchen..Marland Kitchen Two times a day    Lasix 20 Mg Tabs (Furosemide) .Marland Kitchen... 2 tabs once daily    Digoxin 0.125 Mg Tabs (Digoxin) .Marland Kitchen... Take 1 tablet by mouth once daily    Aspirin 81 Mg Tbec  (Aspirin) .Marland Kitchen... Take one tablet by mouth daily    Metoprolol Tartrate 25 Mg Tabs (Metoprolol tartrate) .Marland Kitchen... 1 tab two times a day  Problem # 5:  ICD - IN SITU (ICD-V45.02) Assessment: Unchanged  Problem # 6:  HYPERTENSION (ICD-401.9) Assessment: Improved  Her updated medication list for this problem includes:    Carvedilol 12.5 Mg Tabs (Carvedilol) .Marland Kitchen... Take 1 tab by mouth two times a day    Vasotec 10 Mg Tabs (Enalapril maleate) .Marland Kitchen..Marland Kitchen Two times a day    Lasix 20 Mg Tabs (Furosemide) .Marland Kitchen... 2 tabs once daily    Aspirin 81 Mg Tbec (Aspirin) .Marland Kitchen... Take one tablet by mouth daily    Metoprolol Tartrate 25 Mg Tabs (Metoprolol tartrate) .Marland Kitchen... 1 tab two times a day  Her updated medication list for this problem includes:    Carvedilol 12.5 Mg Tabs (Carvedilol) .Marland Kitchen... Take 1 tab by mouth two times a day    Vasotec 10 Mg Tabs (Enalapril maleate) .Marland Kitchen..Marland Kitchen Two times a day    Lasix 20 Mg Tabs (Furosemide) .Marland Kitchen... 2 tabs once daily    Aspirin 81 Mg Tbec (Aspirin) .Marland Kitchen... Take one tablet by mouth daily    Metoprolol Tartrate 25 Mg Tabs (Metoprolol tartrate) .Marland Kitchen... 1 tab two times a day  Problem # 7:  HYPERLIPIDEMIA (ICD-272.4)  Her updated medication list for this problem includes:    Simvastatin 40 Mg Tabs (Simvastatin) .Marland Kitchen... 1 tab at bedtime  Her updated medication list for this problem includes:    Simvastatin 40 Mg Tabs (Simvastatin) .Marland Kitchen... 1 tab at bedtime  Patient Instructions: 1)  Your physician recommends that you schedule a follow-up appointment in: 3 MONTHS WITH DR Khaleesi Gruel 2)  Your physician recommends that you continue on your current medications as directed. Please refer to the Current Medication list given to you today.

## 2011-01-03 NOTE — Cardiovascular Report (Signed)
Summary: Office Visit   Office Visit   Imported By: Roderic Ovens 09/12/2010 11:48:34  _____________________________________________________________________  External Attachment:    Type:   Image     Comment:   External Document

## 2011-01-03 NOTE — Letter (Signed)
Summary: Walk-In Patient Form  Walk-In Patient Form   Imported By: Marylou Mccoy 05/06/2010 10:16:10  _____________________________________________________________________  External Attachment:    Type:   Image     Comment:   External Document

## 2011-01-03 NOTE — Consult Note (Signed)
Summary: Consultation Report  Consultation Report   Imported By: Debby Freiberg 05/27/2010 17:27:41  _____________________________________________________________________  External Attachment:    Type:   Image     Comment:   External Document

## 2011-01-03 NOTE — Progress Notes (Signed)
Summary: coreg sent in today  Phone Note Refill Request Message from:  Patient on October 03, 2010 9:21 AM  pt needs refill of cor[facility]   Method Requested: Telephone to Pharmacy Initial call taken by: Glynda Jaeger,  October 03, 2010 9:24 AM    Prescriptions: CARVEDILOL 12.5 MG TABS (CARVEDILOL) take 1 tab by mouth two times a day  #60 x 11   Entered by:   Danielle Rankin, CMA   Authorized by:   Gaylord Shih, MD, [facility]   Signed by:   Danielle Rankin, CMA on 10/03/2010   Method used:   Electronically to        [facility]. [phone]* (retail)       [address]       [place], [place]  [address]       Ph: [phone]       Fax: [phone]   RxID:   [id number]

## 2011-01-05 NOTE — Assessment & Plan Note (Signed)
Summary: rov/dfg   Visit Type:  Follow-up Primary Provider:  [name] MD  CC:  HTN / dizziness.  History of Present Illness: [name] [name] comes in today for hypertension.  Her blood pressure at last day or so has been 190-220 on a couple of occasions. Last night was 220/110.  She denies any headache, visual changes, chest tightness or pressure, increased shortness of breath, orthopnea, PND or edema. She is very compliant with her medications. Her weight is actually down 4 pounds since her last visit.  Current Medications (verified): 1)  Carvedilol 12.5 Mg Tabs (Carvedilol) .... Take 1 Tab By Mouth Two Times A Day 2)  Vasotec 10 Mg Tabs (Enalapril Maleate) .... Two Times A Day 3)  Lasix 20 Mg Tabs (Furosemide) .... 3 Tab Once Daily 4)  Nexium 20 Mg Cpdr (Esomeprazole Magnesium) .... Take 1 Tablet By Mouth Once Daily 5)  Vytorin 10-40 Mg Tabs (Ezetimibe-Simvastatin) .Marland Kitchen.. 1tab At Bedtime 6)  Digoxin 0.125 Mg Tabs (Digoxin) .... Take 1 Tablet By Mouth Once Daily 7)  Potassium Chloride Crys Cr 20 Meq Cr-Tabs (Potassium Chloride Crys Cr) .... Take One Tablet By Mouth Daily 8)  Fish Oil 1000 Mg Caps (Omega-3 Fatty Acids) .... Take 2 By Mouth Two Times A Day 9)  Calcium 500 Mg Tabs (Calcium Carbonate) .... Take 1 By Mouth Once Daily 10)  Daily Multiple Vitamins  Tabs (Multiple Vitamin) 11)  Aspirin 81 Mg Tbec (Aspirin) .... Take One Tablet By Mouth Daily 12)  Tylenol Extra Strength 500 Mg Tabs (Acetaminophen) .... 2 Capsules 3 Times A Day 13)  Vitamin D (Ergocalciferol) 50000 Unit Caps (Ergocalciferol) .... As Needed  Allergies (verified): 1)  ! Codeine 2)  ! 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 Discussed DNR--she requests this ("if the Lord decided to take me, just let me go") Wouldn't want feeding tube if not cognitively aware. Asks that husband, then daughter Schotsi Marital Status:  Children:  Occupation:   Risk Factors: Smoking Status: never (17-Dec-2008)  Review of Systems       negative other than history of present illness  Vital Signs:  Patient profile:   77 year old female Height:      62 inches Weight:      129 pounds BMI:     23.68 Pulse rate:   75 / minute  Vitals Entered By: Hardin Negus, RMA (December 07, 2010 12:51 PM)  Serial Vital Signs/Assessments:  Time      Position  BP       Pulse  Resp  Temp     By  R Arm     138/74                         Hardin Negus, RMA           L Arm     142/64                         Hardin Negus, RMA   Physical Exam  General:  no acute distress, skin warm and dry Head:  normocephalic and atraumatic Eyes:  PERRLA/EOM intact; conjunctiva and lids normal. Neck:  Neck supple, no JVD. No masses, thyromegaly or abnormal cervical nodes. Chest Alvilda Mckenna:  no deformities or breast masses noted Lungs:  Clear  bilaterally to auscultation and percussion. Heart:  PMI nondisplaced, regular rate and rhythm, no gallop, no carotid bruit Abdomen:  soft, good bowel sounds, no midline bruit Msk:  decreased ROM.   Pulses:  pulses normal in all 4 extremities Extremities:  no edema Neurologic:  Alert and oriented x 3. Skin:  Intact without lesions or rashes. Psych:  Normal affect.   PPM Specifications Following MD:  Lewayne Bunting, MD     PPM Vendor:  [facility] [employer]     PPM Model Number:  [id number]     PPM Serial Number:  [id number] PPM DOI:  02/16/2010     PPM Implanting MD:  Lewayne Bunting, MD  Lead 1    Location: RA     DOI: 01/04/2004     Model #: [id number]     Serial #: [id number]     Status: active Lead 2    Location: RV     DOI: 01/04/2004     Model #: 6213     Serial #: [id number]     Status: active Lead 3    Location: LV     DOI: 01/04/2004     Model #: [id number]     Serial #: [id number]     Status: active  Magnet Response Rate:  BOL 100 ERI 85  Indications:  ICM   PPM Follow Up Pacer Dependent:  No     Configuration: LV TIP TO LV RING  Episodes Coumadin:  No  Parameters Mode:  DDD     Lower Rate Limit:  60     Upper Rate Limit:  120 Paced AV Delay:  150      ICD Specifications Following MD:  Lewayne Bunting, MD     ICD Vendor:  [employer]     ICD Model Number:  H125     ICD Serial Number:  [id number] ICD DOI:  02/16/2010     ICD Implanting MD:  Lewayne Bunting, MD  Lead 1:    Location: RA     DOI: 01/04/2004     Model #: 1027     Serial #: [id number]     Status: active Lead 2:    Location: RV     DOI: 01/04/2004     Model #: 4742     Serial #: [id number]     Status: active Lead 3:    Location: LV     DOI: 01/04/2004     Model #: 7564     Serial #: [id number]     Status: active  Indications::  ICM, CHF  Explantation Comments: 02/16/2010 [facility] Scientific Contak [id number] explanted.  ICD Follow Up ICD Dependent:  No      Episodes Coumadin:  No  Huston Foley Parameters  Mode DDD     Lower Rate Limit:  60     Upper Rate  Limit 120 PAV 150     Sensed AV Delay:  150  Tachy Zones VF:  240     VT:  210     VT1:  170     Impression & Recommendations:  Problem # 1:  HYPERTENSION (ICD-401.9) Assessment Deteriorated I have increased her Vasotec to 20 mg p.o. b.i.d. She will keep her blood pressure recordings for [place] and has been told to check them about half an hour to 45 minutes after she takes in the morning. She can also do the same at night. We will check a baseline set of electrolytes as well as a digoxin level. She will  return in 2 weeks for close followup. She'll bring her blood pressure cuff at that time to be calibrated.Will check followup electrolytes at that time as well. Her updated medication list for this problem includes:    Carvedilol 12.5 Mg Tabs (Carvedilol) .Marland Kitchen... Take 1 tab by mouth two times a day    Vasotec 20 Mg Tabs (Enalapril maleate) .Marland Kitchen... 1 tablet two times a day    Lasix 20 Mg Tabs (Furosemide) .Marland KitchenMarland KitchenMarland KitchenMarland Kitchen 3 tab once daily    Aspirin 81 Mg Tbec (Aspirin) .Marland Kitchen... Take one tablet by mouth daily  Orders: TLB-BMP (Basic Metabolic Panel-BMET) (80048-METABOL)  Problem # 2:  CAD, NATIVE VESSEL (ICD-414.01) Assessment: Unchanged  Her updated medication list for this problem includes:    Carvedilol 12.5 Mg Tabs (Carvedilol) .Marland Kitchen... Take 1 tab by mouth two times a day    Vasotec 20 Mg Tabs (Enalapril maleate) .Marland Kitchen... 1 tablet two times a day    Aspirin 81 Mg Tbec (Aspirin) .Marland Kitchen... Take one tablet by mouth daily  Orders: T-Digoxin (04540-98119)  Problem # 3:  LBBB (ICD-426.3) Assessment: Unchanged  Her updated medication list for this problem includes:    Carvedilol 12.5 Mg Tabs (Carvedilol) .Marland Kitchen... Take 1 tab by mouth two times a day    Vasotec 20 Mg Tabs (Enalapril maleate) .Marland Kitchen... 1 tablet two times a day    Aspirin 81 Mg Tbec (Aspirin) .Marland Kitchen... Take one tablet by mouth daily  Problem # 4:  SYSTOLIC HEART FAILURE, CHRONIC (ICD-428.22) Assessment: Unchanged  Her updated medication list for this problem  includes:    Carvedilol 12.5 Mg Tabs (Carvedilol) .Marland Kitchen... Take 1 tab by mouth two times a day    Vasotec 20 Mg Tabs (Enalapril maleate) .Marland Kitchen... 1 tablet two times a day    Lasix 20 Mg Tabs (Furosemide) .Marland KitchenMarland KitchenMarland KitchenMarland Kitchen 3 tab once daily    Digoxin 0.125 Mg Tabs (Digoxin) .Marland Kitchen... Take 1 tablet by mouth once daily    Aspirin 81 Mg Tbec (Aspirin) .Marland Kitchen... Take one tablet by mouth daily  Patient Instructions: 1)  Your physician recommends that you schedule a follow-up appointment in: 2 weeks with Dr. Daleen Squibb.  Bring your blood pressure log and blood pressure cuff when you return. 2)  Your physician recommends that you have  lab work today: bmet, digoxin level 3)  Your physician has recommended you make the following change in your medication: Increase Vasotec 4)  Your physician has requested that you regularly monitor and record your blood pressure readings at home.  Please use the same machine at the same time of day to check your readings and record them to bring to your follow-up visit. REMEMBER TO CHECK AND RECORD YOUR BLOOD PRESSURE 30-45 MINUTES AFTER TAKING YOUR MEDICATIONS AS WELL. Prescriptions: VASOTEC  20 MG TABS (ENALAPRIL MALEATE) 1 tablet two times a day  #60 x 11   Entered by:   Lisabeth Devoid RN   Authorized by:   Gaylord Shih, MD, [facility]   Signed by:   Lisabeth Devoid RN on 12/07/2010   Method used:   Faxed to ...       [address]  [place]. [phone]* (retail)       [address]       [place], [place]  [address]       Ph: [phone]       Fax: [phone]   RxID:   [id number]

## 2011-01-05 NOTE — Progress Notes (Signed)
Summary: RX SENT TO [facility] TODAY  Phone Note Call from Patient Call back at Home Phone [phone]    Method Requested: Telephone to Pharmacy Caller: Mom Reason for Call: Refill Medication, Talk to Nurse Summary of Call: pt needs LASIX 80 MG TABS [facility] Initial call taken by: Roe Coombs,  December 23, 2010 11:35 AM    New/Updated Medications: FUROSEMIDE 40 MG TABS (FUROSEMIDE) 2 tabs every morning Prescriptions: FUROSEMIDE 40 MG TABS (FUROSEMIDE) 2 tabs every morning  #180 x 3   Entered by:   Danielle Rankin, CMA   Authorized by:   Gaylord Shih, MD, [facility]   Signed by:   Danielle Rankin, CMA on 12/23/2010   Method used:   Faxed to ...       [facility] education [facility])       [address] [address]       [place], [place]  [address]       Ph: [phone]       Fax: [phone]   RxID:   [id number]

## 2011-01-05 NOTE — Assessment & Plan Note (Signed)
Summary: F2W/DFG   Visit Type:  2 wk f/u Primary Provider:  [name] MD  CC:  chest discomfort recently...sob.....  History of Present Illness: [name] [name] returns today for close followup of her shortness of breath, chronic systolic heart failure, and hypertension. See  my note from January 4.  At that time, we increased her Vasotec to 20 mg twice a day. A set of electrolytes on the day were stable with a BUN of 20 creatinine of 0.6. She had taken extra Lasix the day before. Digoxin level was therapeutic at 1.1.  She comes in with a list of blood pressure readings today and weights. Her blood pressures are generally 140/80 an hour and a half after she takes her a.m. meds as well as in the afternoon. One evening reading at 9:20 PM was 120/90. However, when she first gets up and checks her pressure issues are running around 160/90-180 over 100. Her weights have varied between 127.5 and 134.5. She has taken an  extra Lasix on a couple of occasions. Her weight promptly dropped from 133.5 range to 129-129.5. Her weight today is only a pound and a quarter more than last visit.  According to her family, she cooksa  fairly high sodium diet for her husband. This could explain the significant variations in weight. She does weigh each morning without clothes.  Current Medications (verified): 1)  Carvedilol 12.5 Mg Tabs (Carvedilol) .... Take 1 Tab By Mouth Two Times A Day 2)  Vasotec 20 Mg Tabs (Enalapril Maleate) .Marland Kitchen.. 1 Tablet Two Times A Day 3)  Lasix 20 Mg Tabs (Furosemide) .... 3 Tab Once Daily 4)  Nexium 20 Mg Cpdr (Esomeprazole Magnesium) .... Take 1 Tablet By Mouth Once Daily 5)  Vytorin 10-40 Mg Tabs (Ezetimibe-Simvastatin) .Marland Kitchen.. 1tab At Bedtime 6)  Digoxin 0.125 Mg Tabs (Digoxin) .... Take 1 Tablet By Mouth Once Daily 7)  Potassium Chloride Crys Cr 20 Meq Cr-Tabs (Potassium Chloride Crys Cr) .... Take One Tablet By Mouth Daily 8)  Fish Oil 1000 Mg Caps (Omega-3 Fatty Acids) .... Take  2 By Mouth Two Times A Day 9)  Calcium 500 Mg Tabs (Calcium Carbonate) .... Take 1 By Mouth Once Daily 10)  Daily Multiple Vitamins  Tabs (Multiple Vitamin) .Marland Kitchen.. 1 Tab Once Daily 11)  Aspirin 81 Mg Tbec (Aspirin) .... Take One Tablet By Mouth Daily 12)  Tylenol Extra Strength 500 Mg Tabs (Acetaminophen) .... 2 Capsules 3 Times A Day 13)  Vitamin D 1000 Unit Tabs (Cholecalciferol) .Marland Kitchen.. 1 Tab Once Daily  Allergies: 1)  ! Codeine 2)  ! 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 Discussed DNR--she requests this ("if  the Lord decided to take me, just let me go") Wouldn't want feeding tube if not cognitively aware. Asks that husband, then daughter Schotsi Marital Status:  Children:  Occupation:   Risk Factors: Smoking Status: never (12/16/2008)  Review of Systems       negative other than history of present  illness  Vital Signs:  Patient profile:   77 year old female Height:      62 inches Weight:      130.25 pounds BMI:     23.91 Pulse rate:   66 / minute Pulse rhythm:   irregular BP sitting:   124 / 66  (left arm) Cuff size:   large  Vitals Entered By: Danielle Rankin, CMA (December 21, 2010 3:03 PM)  Physical Exam  General:  elderly, no acute distress Head:  normocephalic and atraumatic Eyes:  PERRLA/EOM intact; conjunctiva and lids normal. Neck:  Neck supple, no JVD. No masses, thyromegaly or abnormal cervical nodes. Lungs:  Clear bilaterally to auscultation and percussion. Heart:  PMI displaced inferiorly laterally, no S3, regular rate and rhythm Abdomen:  soft, good bowel sounds, nondistended Msk:  Back normal, normal gait. Muscle strength and tone normal. Pulses:  pulses normal in all 4 extremities Extremities:  trace left pedal edema and trace right pedal edema.   Neurologic:  Alert and oriented x 3. Skin:  Intact without lesions or rashes. Psych:  Normal affect.   PPM Specifications Following MD:  Lewayne Bunting, MD     PPM Vendor:  [facility] [employer]     PPM Model Number:  [id number]     PPM Serial Number:  [id number] PPM DOI:  02/16/2010     PPM Implanting MD:  Lewayne Bunting, MD  Lead 1    Location: RA     DOI: 01/04/2004     Model #: 0981     Serial #: [id number]     Status: active Lead 2    Location: RV     DOI: 01/04/2004     Model #: 6213     Serial #: [id number]     Status: active Lead 3    Location: LV     DOI: 01/04/2004     Model #: 4696     Serial #: [id number]     Status: active  Magnet Response Rate:  BOL 100 ERI 85  Indications:  ICM   PPM Follow Up Pacer Dependent:  No     Configuration: LV TIP TO LV RING  Episodes Coumadin:  No  Parameters Mode:  DDD     Lower Rate Limit:  60     Upper Rate Limit:  120 Paced AV Delay:  150      ICD Specifications Following MD:  Lewayne Bunting, MD     ICD Vendor:  [employer]     ICD Model Number:  H125     ICD Serial Number:   [id number] ICD DOI:  02/16/2010     ICD Implanting MD:  Lewayne Bunting, MD  Lead 1:    Location: RA     DOI: 01/04/2004     Model #: 1027     Serial #: [id number]     Status: active Lead 2:    Location: RV     DOI: 01/04/2004     Model #: 4742     Serial #: [id number]     Status: active Lead 3:    Location: LV     DOI: 01/04/2004     Model #:  [id number]     Serial #: [id number]     Status: active  Indications::  ICM, CHF  Explantation Comments: 02/16/2010 [facility] [employer] Contak [id number] explanted.  ICD Follow Up ICD Dependent:  No      Episodes Coumadin:  No  Brady Parameters Mode DDD     Lower Rate Limit:  60     Upper Rate Limit 120 PAV 150     Sensed AV Delay:  150  Tachy Zones VF:  240     VT:  210     VT1:  170     Impression & Recommendations:  Problem # 1:  SYSTOLIC HEART FAILURE, CHRONIC (ICD-428.22) I spoke with Dr.Deterding, her son-in-law, and we will increase her Lasix to 80 mg each morning. If her weight increases 2 pounds or more, we'll take an extra 20 mg of Lasix that morning. She will check her pressures about an hour after taking her morning meds. If it is 140/80 or so I told her to be happy with that.Will check electrolytes and renal function today. Her updated medication list for this problem includes:    Carvedilol 12.5 Mg Tabs (Carvedilol) .Marland Kitchen... Take 1 tab by mouth two times a day    Vasotec 20 Mg Tabs (Enalapril maleate) .Marland Kitchen... 1 tablet two times a day    Lasix 20 Mg Tabs (Furosemide) .Marland KitchenMarland KitchenMarland KitchenMarland Kitchen 4 tab every am    Digoxin 0.125 Mg Tabs (Digoxin) .Marland Kitchen... Take 1 tablet by mouth once daily    Aspirin 81 Mg Tbec (Aspirin) .Marland Kitchen... Take one tablet by mouth daily  Orders: EKG w/ Interpretation (93000)  Problem # 2:  HYPERTENSION (ICD-401.9) Her Blood Pressure is roughly the same in both arms. In addition her blood pressure cuff closely correlate with hours. Her updated medication list for this problem includes:    Carvedilol 12.5 Mg Tabs (Carvedilol) .Marland Kitchen... Take 1 tab by mouth two times a  day    Vasotec 20 Mg Tabs (Enalapril maleate) .Marland Kitchen... 1 tablet two times a day    Lasix 20 Mg Tabs (Furosemide) .Marland KitchenMarland KitchenMarland KitchenMarland Kitchen 4 tab every am    Aspirin 81 Mg Tbec (Aspirin) .Marland Kitchen... Take one tablet by mouth daily  Other Orders: TLB-BMP (Basic Metabolic Panel-BMET) (80048-METABOL)  Patient Instructions: 1)  Your physician recommends that you schedule a follow-up appointment in: 4-6 weeks with Dr. Daleen Squibb 01-31-11 at 11:00 am 2)  Your physician recommends that you have lab work today: BMET 3)  Your physician has recommended you make the following change in your medication: You may take an extra 20mg  lasix (furosemide) if your weight goes up 2 pounds from 1 day to the next. 4)  CHECK YOUR BLOOD PRESSURE 1 HOUR AFTER YOU HAVE TAKEN YOUR MORNING MEDICATIONS. 5)  Your physician has requested that you limit the intake of sodium (salt) in your diet to two grams daily. Please see [facility] handout.

## 2011-01-05 NOTE — Cardiovascular Report (Signed)
Summary: Office Visit   Office Visit   Imported By: Roderic Ovens 11/14/2010 14:47:34  _____________________________________________________________________  External Attachment:    Type:   Image     Comment:   External Document

## 2011-01-05 NOTE — Progress Notes (Signed)
Summary: high bp/sob  Phone Note Call from Patient   Caller: Patient [phone] Reason for Call: Talk to Nurse Summary of Call: pt calling re her blood pressure being too high/sob/lightheadedness x a few days Initial call taken by: Glynda Jaeger,  December 07, 2010 8:39 AM  Follow-up for Phone Call        Pt calls with High bp for the past couple of days.  1/3:  180/90, 170/100. Today 1/4:  180/90 & 178/100 before meds:  2 hours after meds bp is: 192/110.  Per Dr. Darrick Penna she increased her furosemide to 60 in the am today and 80mg  this evening.  She denies edema, and sob.  she does c/o of lightheadedness both at rest and with ambulation.  She will see Dr. Daleen Squibb today at 12:00. [name] RN  Additional Follow-up for Phone Call Additional follow up Details #1::        I will see in office. Additional Follow-up by: Gaylord Shih, MD, [facility],  December 07, 2010 9:31 AM

## 2011-01-05 NOTE — Miscellaneous (Signed)
Summary: corrected device information  Clinical Lists Changes  Observations: Added new observation of PPMLEADSER1: [id number] (12/29/2010 9:39)      PPM Specifications Following MD:  Lewayne Bunting, MD     PPM Vendor:  [facility] [employer]     PPM Model Number:  H125     PPM Serial Number:  [id number] PPM DOI:  02/16/2010     PPM Implanting MD:  Lewayne Bunting, MD  Lead 1    Location: RA     DOI: 01/04/2004     Model #: 9147     Serial #: [id number]     Status: active Lead 2    Location: RV     DOI: 01/04/2004     Model #: 8657     Serial #: [id number]     Status: active Lead 3    Location: LV     DOI: 01/04/2004     Model #: 8469     Serial #: [id number]     Status: active  Magnet Response Rate:  BOL 100 ERI 85  Indications:  ICM   PPM Follow Up Pacer Dependent:  No     Configuration: LV TIP TO LV RING  Episodes Coumadin:  No  Parameters Mode:  DDD     Lower Rate Limit:  60     Upper Rate Limit:  120 Paced AV Delay:  150      ICD Specifications Following MD:  Lewayne Bunting, MD     ICD Vendor:  [employer]     ICD Model Number:  H125     ICD Serial Number:  [id number] ICD DOI:  02/16/2010     ICD Implanting MD:  Lewayne Bunting, MD  Lead 1:    Location: RA     DOI: 01/04/2004     Model #: 0102     Serial #: [id number]     Status: active Lead 2:    Location: RV     DOI: 01/04/2004     Model #: 3474     Serial #: [id number]     Status: active Lead 3:    Location: LV     DOI: 01/04/2004     Model #: 8756     Serial #: [id number]     Status: active  Indications::  ICM, CHF  Explantation Comments: 02/16/2010 [facility] [employer] Contak [id number] explanted.  ICD Follow Up ICD Dependent:  No      Episodes Coumadin:  No  Brady Parameters Mode DDD     Lower Rate Limit:  60     Upper Rate Limit 120 PAV 150     Sensed AV Delay:  150  Tachy Zones VF:  240     VT:  210     VT1:  170

## 2011-01-31 ENCOUNTER — Ambulatory Visit (INDEPENDENT_AMBULATORY_CARE_PROVIDER_SITE_OTHER): Payer: Medicare Other | Admitting: Cardiology

## 2011-01-31 ENCOUNTER — Encounter: Payer: Self-pay | Admitting: Cardiology

## 2011-01-31 DIAGNOSIS — I2510 Atherosclerotic heart disease of native coronary artery without angina pectoris: Secondary | ICD-10-CM

## 2011-01-31 DIAGNOSIS — I10 Essential (primary) hypertension: Secondary | ICD-10-CM

## 2011-01-31 DIAGNOSIS — I5022 Chronic systolic (congestive) heart failure: Secondary | ICD-10-CM

## 2011-02-09 NOTE — Assessment & Plan Note (Signed)
Summary: f4-6 wk/dfg/hm   Visit Type:  follow-up on bp Primary Provider:  [name] MD  CC:  pt has no complaints today. pt did bring records of bp. .  History of Present Illness: [name] [name] returns for followup of her CHF and HTN. Her weights have been stable and she has required no extra Lasix. BP averaging 140/85.  Current Medications (verified): 1)  Carvedilol 12.5 Mg Tabs (Carvedilol) .... Take 1 Tab By Mouth Two Times A Day 2)  Vasotec 20 Mg Tabs (Enalapril Maleate) .Marland Kitchen.. 1 Tablet Two Times A Day 3)  Furosemide 40 Mg Tabs (Furosemide) .... 2 Tabs Every Morning 4)  Nexium 20 Mg Cpdr (Esomeprazole Magnesium) .... Take 1 Tablet By Mouth Once Daily 5)  Vytorin 10-40 Mg Tabs (Ezetimibe-Simvastatin) .Marland Kitchen.. 1tab At Bedtime 6)  Digoxin 0.125 Mg Tabs (Digoxin) .... Take 1 Tablet By Mouth Once Daily 7)  Potassium Chloride Crys Cr 20 Meq Cr-Tabs (Potassium Chloride Crys Cr) .... Take One Tablet By Mouth Daily 8)  Fish Oil 1000 Mg Caps (Omega-3 Fatty Acids) .... Take 2 By Mouth 1 Time A Day 9)  Calcium 500 Mg Tabs (Calcium Carbonate) .... Take 1 By Mouth Once Daily 10)  Daily Multiple Vitamins  Tabs (Multiple Vitamin) .Marland Kitchen.. 1 Tab Once Daily 11)  Aspirin 81 Mg Tbec (Aspirin) .... Take One Tablet By Mouth Daily 12)  Tylenol Extra Strength 500 Mg Tabs (Acetaminophen) .... 2 Capsules 3 Times A Day 13)  Vitamin D 1000 Unit Tabs (Cholecalciferol) .Marland Kitchen.. 1 Tab Once Daily  Allergies (verified): 1)  ! Codeine 2)  ! 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 Discussed DNR--she requests this ("if the Lord decided to take me, just let me go") Wouldn't want feeding tube if not cognitively aware. Asks that husband, then daughter Schotsi Marital Status:  Children:  Occupation:   Risk Factors: Smoking Status: never (December 31, 2008)  Review of Systems       negative other than HPI  Vital Signs:  Patient profile:   77 year old female Height:      62 inches Weight:      132.50 pounds BMI:     24.32 Pulse rate:   68 / minute Resp:     18 per minute BP sitting:   148 / 80  (left arm) Cuff size:   large  Vitals Entered By: Celestia Khat, CMA (January 31, 2011 11:48 AM)  Physical Exam  General:  Well developed, well nourished, in no acute distress. Head:  normocephalic and atraumatic Neck:  Neck supple, no JVD. No masses, thyromegaly or abnormal cervical nodes. Lungs:  Clear bilaterally to auscultation and percussion. Heart:  RRR, NO GALLOP Msk:  decreased ROM.   Pulses:  pulses normal in all 4 extremities Extremities:  No clubbing or cyanosis. Neurologic:  Alert and oriented x 3. Skin:  Intact without lesions or rashes. Psych:  Normal affect.   PPM Specifications Following MD:  Lewayne Bunting, MD     PPM Vendor:  [facility] [employer]     PPM Model Number:  [id number]     PPM Serial Number:  [id number] PPM DOI:   02/16/2010     PPM Implanting MD:  Lewayne Bunting, MD  Lead 1    Location: RA     DOI: 01/04/2004     Model #: 0981     Serial #: [id number]     Status: active Lead 2    Location: RV     DOI: 01/04/2004     Model #: 6213     Serial #: [id number]     Status: active Lead 3    Location: LV     DOI: 01/04/2004     Model #: 0865     Serial #: [id number]     Status: active  Magnet Response Rate:  BOL 100 ERI 85  Indications:  ICM   PPM Follow Up Pacer Dependent:  No     Configuration: LV TIP TO LV RING  Episodes Coumadin:  No  Parameters Mode:  DDD     Lower Rate Limit:  60     Upper Rate Limit:  120 Paced AV Delay:  150      ICD Specifications Following MD:  Lewayne Bunting, MD     ICD Vendor:  [employer]     ICD Model Number:  H125     ICD Serial Number:  [id number] ICD DOI:  02/16/2010     ICD Implanting MD:  Lewayne Bunting, MD  Lead 1:    Location: RA     DOI: 01/04/2004     Model #: 1324     Serial #: [id number]     Status: active Lead 2:    Location: RV     DOI: 01/04/2004     Model #: 6644     Serial #: [id number]     Status: active Lead 3:    Location: LV     DOI: 01/04/2004     Model #: 5956     Serial #: [id number]     Status: active  Indications::  ICM, CHF  Explantation Comments: 02/16/2010 [facility] Scientific Contak [id number] explanted.  ICD Follow Up ICD Dependent:  No      Episodes Coumadin:  No  Brady Parameters Mode DDD     Lower Rate Limit:  60     Upper Rate Limit 120 PAV 150     Sensed AV Delay:  150  Tachy Zones VF:  240     VT:  210     VT1:  170     Impression & Recommendations:  Problem # 1:  SYSTOLIC HEART FAILURE, CHRONIC (ICD-428.22) Assessment Improved  Her updated medication list for this problem includes:    Carvedilol 12.5 Mg Tabs (Carvedilol) .Marland Kitchen... Take 1 tab by mouth two times a day    Vasotec 20 Mg Tabs (Enalapril maleate) .Marland Kitchen... 1 tablet two times a day    Furosemide 40 Mg Tabs (Furosemide) .Marland Kitchen... 2 tabs every morning    Digoxin 0.125 Mg Tabs (Digoxin) .Marland Kitchen...  Take 1 tablet by mouth once daily    Aspirin 81 Mg Tbec (Aspirin) .Marland Kitchen... Take  one tablet by mouth daily  Problem # 2:  HYPERTENSION (ICD-401.9) Assessment: Improved  Her updated medication list for this problem includes:    Carvedilol 12.5 Mg Tabs (Carvedilol) .Marland Kitchen... Take 1 tab by mouth two times a day    Vasotec 20 Mg Tabs (Enalapril maleate) .Marland Kitchen... 1 tablet two times a day    Furosemide 40 Mg Tabs (Furosemide) .Marland Kitchen... 2 tabs every morning    Aspirin 81 Mg Tbec (Aspirin) .Marland Kitchen... Take one tablet by mouth daily  Appended Document: f4-6 wk/dfg/hm    Clinical Lists Changes  Observations: Added new observation of PI CARDIO: Your physician recommends that you schedule a follow-up appointment in: 3 months 05-03-11 at 10:15am with Dr. Daleen Squibb Your physician recommends that you continue on your current medications as directed. Please refer to the Current Medication list given to you today. (01/31/2011 12:08)       Patient Instructions: 1)  Your physician recommends that you schedule a follow-up appointment in: 3 months 05-03-11 at 10:15am with Dr. Daleen Squibb 2)  Your physician recommends that you continue on your current medications as directed. Please refer to the Current Medication list given to you today.

## 2011-02-22 LAB — POCT CARDIAC MARKERS
CKMB, poc: <1 ng/mL — ABNORMAL LOW (ref 1.0–8.0)
Myoglobin, poc: 151 ng/mL (ref 12–200)
Myoglobin, poc: 252 ng/mL (ref 12–200)

## 2011-02-22 LAB — CBC
HCT: 35 % — ABNORMAL LOW (ref 36.0–46.0)
HCT: 39.8 % (ref 36.0–46.0)
Hemoglobin: 11.9 g/dL — ABNORMAL LOW (ref 12.0–15.0)
Hemoglobin: 13.6 g/dL (ref 12.0–15.0)
MCHC: 34 g/dL (ref 30.0–36.0)
MCHC: 34.1 g/dL (ref 30.0–36.0)
RBC: 3.51 MIL/uL — ABNORMAL LOW (ref 3.87–5.11)
RBC: 3.98 MIL/uL (ref 3.87–5.11)
RDW: 14 % (ref 11.5–15.5)

## 2011-02-22 LAB — BASIC METABOLIC PANEL
CO2: 24 mEq/L (ref 19–32)
CO2: 25 mEq/L (ref 19–32)
CO2: 27 mEq/L (ref 19–32)
Calcium: 8.1 mg/dL — ABNORMAL LOW (ref 8.4–10.5)
Calcium: 8.8 mg/dL (ref 8.4–10.5)
GFR calc Af Amer: >60 mL/min (ref >60)
Glucose, Bld: 101 mg/dL — ABNORMAL HIGH (ref 70–99)
Glucose, Bld: 131 mg/dL — ABNORMAL HIGH (ref 70–99)
Potassium: 3.5 mEq/L (ref 3.5–5.1)
Potassium: 4.1 mEq/L (ref 3.5–5.1)
Potassium: 4.1 mEq/L (ref 3.5–5.1)
Sodium: 136 mEq/L (ref 135–145)
Sodium: 138 mEq/L (ref 135–145)
Sodium: 140 mEq/L (ref 135–145)

## 2011-02-22 LAB — DIFFERENTIAL
Basophils Relative: 0 % (ref 0–1)
Eosinophils Relative: 0 % (ref 0–5)
Lymphocytes Relative: 16 % (ref 12–46)
Monocytes Absolute: 1 10*3/uL (ref 0.1–1.0)
Monocytes Relative: 11 % (ref 3–12)
Neutro Abs: 6.3 10*3/uL (ref 1.7–7.7)

## 2011-02-22 LAB — URINE CULTURE

## 2011-02-22 LAB — PROTIME-INR
INR: 1.08 (ref 0.00–1.49)
Prothrombin Time: 13.9 seconds (ref 11.6–15.2)

## 2011-02-22 LAB — URINE MICROSCOPIC-ADD ON

## 2011-02-22 LAB — POCT I-STAT, CHEM 8
Chloride: 103 mEq/L (ref 96–112)
HCT: 41 % (ref 36.0–46.0)
Hemoglobin: 13.9 g/dL (ref 12.0–15.0)
Potassium: 4.2 mEq/L (ref 3.5–5.1)
Sodium: 137 mEq/L (ref 135–145)

## 2011-02-22 LAB — URINALYSIS, ROUTINE W REFLEX MICROSCOPIC
Glucose, UA: NEGATIVE mg/dL
Leukocytes, UA: NEGATIVE
Protein, ur: NEGATIVE mg/dL
Specific Gravity, Urine: 1.006 (ref 1.005–1.030)
pH: 5 (ref 5.0–8.0)

## 2011-04-18 NOTE — Assessment & Plan Note (Signed)
[facility]                         ELECTROPHYSIOLOGY OFFICE NOTE   NAME:Brake, [name] [name]                        MRN:          [id number]  DATE:12/10/2007                            DOB:          01-15-1921    [name] [name] returns today for follow-up.  She is a very pleasant elderly  woman with a history of congestive heart failure and nonischemic  cardiomyopathy, status post ICD implantation who returns today for  follow-up.  She has recently been stable.  She has no chest pain or  shortness of breath.   MEDICATIONS:  1. Multiple vitamins.  2. Lanoxin 0.125 daily.  3. Lasix 60 a day.  4. Potassium 20 a day.  5. Vytorin 10/40.  6. Aspirin 81 a day.  7. Coreg 12.5 twice daily.  8. Vasotec 10 mg twice daily.   PHYSICAL EXAMINATION:  She is a pleasant elderly woman in no distress.  Blood pressure is 115/60, the pulse was 67 and regular, respirations  were 18.  Weight was 141 pounds.  NECK:  Revealed no jugular distention.  LUNGS:  Clear bilaterally to auscultation. No wheezes, rales or rhonchi  were present.  CARDIOVASCULAR:  Regular rate with normal S1 and S2.  EXTREMITIES:  Demonstrated no edema.   Interrogation of her defibrillator demonstrates a Guidant contact H -  175. P and R waves were 2 and 11 respectively, the impedance 432 in the  atrium, 742 in the right ventricle, 1000 in the left ventricle.  The  threshold was 0.4 at  0.4 in the a, 0.6 at 0.4 in the v and 1.4 at 0.4  in the LV. Battery voltage was 2.58 volts.   IMPRESSION:  1. Nonischemic cardiomyopathy.  2. Congestive heart failure.  3. Status post BiV ICD insertion.   DISCUSSION:  Overall [name] [name] is stable and her defibrillator is  working normally.  Will see her back in the office in 1 year for ICD  follow-up and she can follow-up in our latitude program as well. She is  approaching but not yet at [facility].    Doylene Canning. Ladona Ridgel, MD  Electronically Signed   GWT/MedQ  DD:  12/10/2007  DT: 12/11/2007  Job #: [id number]

## 2011-04-18 NOTE — Progress Notes (Signed)
[facility] OFFICE NOTE   NAME:[name] [name] WINFREE                        MRN:          [id number]  DATE:12/07/2008                            DOB:          03/22/1921    [name] [name] returns today for followup.  She is a very pleasant elderly  woman with a history of nonischemic cardiomyopathy and left bundle-  branch block status post biV ICD insertion placed initially in 2005.  She returns today for followup.  The patient has done amazingly well in  the last year.  Her main complaint today is difficulty walking because  of spinal stenosis and lower back pain as well as bilateral leg pain.  She really denies chest pain or shortness of breath with the limitations  as noted.   CURRENT MEDICATIONS:  1. Potassium 20 a day.  2. Vitamin D twice weekly.  3. Digoxin 0.125 daily.  4. Lasix 60 a day.  5. Nexium 20 a day.  6. Vasotec 10 twice a day.  7. Vytorin 10/40 daily.  8. Coreg 12.5 twice a day.  9. Fish oil.  10.Multivitamin.  11.Calcium supplement.   PHYSICAL EXAMINATION:  GENERAL:  She is a pleasant elderly woman in no  distress.  VITAL SIGNS:  Blood pressure was 130/60, the pulse 60 and regular,  respirations were 18, the weight was 132 pounds, down 9 pounds from a  year ago.  NECK:  No jugular venous distention.  LUNGS:  Clear bilaterally to auscultation.  No wheezes, rales, or  rhonchi are present.  There is no increased work of breathing.  CARDIOVASCULAR:  Regular rate and rhythm.  Normal S1 and S2.  There are  no murmurs, rubs, or gallops.  ABDOMEN:  Soft, nontender.  EXTREMITIES:  No peripheral edema.   Interrogation of her defibrillator demonstrates Guidant contact H-175.  P-waves were 2, the R-waves were 7, the impedance 432 in the A, 679 in  the V, and 1000 in the LV.  Threshold 0.4 at 0.4 in the A , 0.8 at 0.4  in the RV, and 1.2 at 0.4 in the LV.  The battery voltage was 2.57 volts  (MOL 2).  She was 97% V-paced, 13% A-paced.   IMPRESSION:  1. Nonischemic cardiomyopathy.  2. Congestive heart failure.  3. Status post biventricular implantable cardioverter-defibrillator      insertion.   DISCUSSION:  [name] [name] is stable.  She is approaching but not yet at  elective replacement indication for her defibrillator.  Because of her  advanced age, I would strongly consider changing her to a biV pacemaker  and this will be scheduled when her device has reached ERI.     Doylene Canning. Ladona Ridgel, MD  Electronically Signed    GWT/MedQ  DD: 12/07/2008  DT: 12/08/2008  Job #: [id number]

## 2011-04-18 NOTE — Assessment & Plan Note (Signed)
[facility] OFFICE NOTE   NAME:[name], [name] [name]                        MRN:          [id number]  DATE:01/19/2009                            DOB:          [birth year]    Ms. [name] [name] comes in today for followup.   Other than some slight increase in dyspnea on exertion, she has no  complaints.  She specifically denies any orthopnea, PND, or increased  peripheral edema.  Her weight is actually down from a year ago from 141  to 132.  It is identical to a weight obtained, December 07, 2008, when she  saw Dr. Lewayne Bunting in the EP office.   PROBLEM LIST:  1. Nonischemic cardiomyopathy.  2. Nonobstructive coronary disease.  3. Chronic systolic congestive heart failure.  4. Hyperlipidemia.  5. Status post biventricular implantable cardiac defibrillator,      stable, checked December 07, 2008.   MEDICATIONS:  1. Vitamin D 50,000 units twice weekly.  2. Potassium 20 mEq a day.  3. Digoxin 0.125 mg per day.  4. Lasix 60 mg per day.  5. Nexium 20 mg per day.  6. Enalapril 10 mg p.o. b.i.d.  7. Vytorin 10/40 daily.  8. Coreg 12.5 b.i.d.  9. Multivitamin daily.  10.Calcium one p.o. b.i.d.  11.Fish oil one daily.  12.Aspirin 81 mg a day.   PHYSICAL EXAMINATION:  VITAL SIGNS:  Her blood pressure today is 120/70,  her pulse is 67, and she is in sinus rhythm with ventricular pacing.  Weight is identical at 132.  HEENT:  Normal except for arcus senilis.  NECK:  No significant JVD.  Carotids are full without bruits.  Thyroid  is not enlarged.  Trachea is midline.  CHEST:  Lungs are clear to auscultation and percussion.  Specifically,  there are no rales or rhonchi.  HEART:  Displaced PMI inferolaterally.  She has a normal S1,  paradoxically split S2, no S3.  No right ventricular lift.  ABDOMEN:  Soft, good bowel sounds.  No midline bruit.  No pulsatile  mass.  EXTREMITIES:  No cyanosis, clubbing, or significant edema.   Pulses were  present, but reduced.  She has no sign of DVT, though she does have  significant varicose veins particularly in the left lower extremity  below the knee.  NEUROLOGIC:  Intact.   Her O2 sat on room air is 96%.  We walked her up and down in the hall,  she became short of breath and her O2 sat was actually 97%.   ASSESSMENT:  Slight increase in dyspnea on exertion, probably  cardiogenic.  She is clearly not decompensated and her weight is stable.  I see little value to change her medications at present.   I have reviewed the laboratory data from Dr. Karle Starch office on December 16, 2008.  Her LDL is 79.5, her creatinine was stable at 0.6, potassium  4.4, hemoglobin 13.5, TSH was normal.  Dig level 0.5.   I have made no changes in her medical program today.  I made it clear to  her that  she develops increased peripheral edema, weight increases, or  she develops any sign of orthopnea, she is to let me know.  At that  point, increasing her diuretic would be a value.  I will see her back in  6 months.     Thomas C. Daleen Squibb, MD, [facility]  Electronically Signed    TCW/MedQ  DD: 01/19/2009  DT: 01/20/2009  Job #: [id number]

## 2011-04-21 ENCOUNTER — Other Ambulatory Visit: Payer: Self-pay | Admitting: Orthopedic Surgery

## 2011-04-21 DIAGNOSIS — M4800 Spinal stenosis, site unspecified: Secondary | ICD-10-CM

## 2011-04-21 NOTE — Discharge Summary (Signed)
[name] [name], [name] [name]                             ACCOUNT NO.:  [id number]   MEDICAL RECORD NO.:  [id number]                   PATIENT TYPE:  INP   LOCATION:  2907                                 FACILITY:  [facility]   PHYSICIAN:  Duke Salvia, M.D.               DATE OF BIRTH:  Apr 29, 1921   DATE OF ADMISSION:  01/04/2004  DATE OF DISCHARGE:  01/06/2004                                 DISCHARGE SUMMARY   PRIMARY DIAGNOSIS:  Ischemic cardiomyopathy.   SECONDARY DIAGNOSES:  1. Recurrent syncope.  2. Nonischemic cardiomyopathy.  3. Ejection fraction of 20%.   HISTORY OF PRESENT ILLNESS:  This is an 77 year old female with a history of  nonischemic cardiomyopathy that was detected in 1995.  Catheterization at  that time showed an EF of 20%, significant but nonobstructive coronary  disease.  Cardiolite scanning has further supported this as nonischemic  myopathy without evidence of perfusion abnormalities.  Initial treatments  with angiotensin converting enzymes resulted in improvement in her systolic  function with subsequent deterioration over the last year.  There has been  progressive decline in exercise tolerance.  She is currently unable to climb  a flight of stairs, dyspneic at less than 300 feet, has occasional  peripheral edema, dyspnea with activities of daily living, though no  orthopnea nor nocturnal dyspnea.  Also a history of recent syncope, though a  mechanism of this seemed to be vasovagal and mediated with either  nitroglycerin or pain.  She does have a history of significant complex  ventricular tachycardia, ventricular ectopy in the past with a history of  tachypalpitations. They were abrupt on onset and offset.  This occurred at  47.  She does not recall recurrent episodes since that time.   PAST MEDICAL HISTORY:  1. Notable for recurrent TIAs manifested as aphasia.  2. There is also some hemianopsia which describes as low or high.   PAST SURGICAL HISTORY:  1.  Appendectomy.  2. Two kidney surgeries.  3. A cholecystectomy.  4. Underwent a placement of a biventricular ICD.   HOSPITAL COURSE:  The patient complained of pleuritic chest pain and  pericardial rub postoperatively consistent with microlead perforation.  Interrogation of the device was performed. The patient was then taken back  to an EP lab for revision of that lead and discharged the following day in  stable condition.   DISCHARGE MEDICATIONS:  1. K-Dur 20 mEq q.d.  2. Vasotec 5 mg b.i.d.  3. Zocor 40 mg nightly.  4. Coated 81 mg q.d.  5. Aciphex 20 mg q.d.  6. Lanoxin 0.125 mg q.d.  7. Lasix 60 mg q.d.  8. Coreg 6.25 mg q.d.  9. Multivitamin q.d.  10.      Keflex 250 mg q.6h. for five days.  11.      Darvocet-N 1-2 p.r.n. q.4-6h. p.r.n. or Motrin 600 mg q.6.h. p.r.n.   DISCHARGE INSTRUCTIONS:  Activity and wound care were per pacemaker  discharge sheet.  The patient was to follow up at the pacemaker clinic at  [facility] January 20, 2004 at 9:15 a.m. and Dr. Duke Salvia on March 24, 2004 at 10:16 a.m.      Chinita Pester, C.R.N.P. [facility]                 Duke Salvia, M.D.    DS/MEDQ  D:  03/10/2004  T:  03/11/2004  Job:  [id number]   cc:   Duke Salvia, M.D.   Thomas C. Wall, M.D.

## 2011-04-21 NOTE — Op Note (Signed)
[name] [name], [name] [name]                 ACCOUNT NO.:  [id number]   MEDICAL RECORD NO.:  [id number]          PATIENT TYPE:  OIB   LOCATION:  5021                         FACILITY:  [facility]   PHYSICIAN:  Marlowe Kays, M.D.  DATE OF BIRTH:  04-Jan-1921   DATE OF PROCEDURE:  07/27/2006  DATE OF DISCHARGE:  07/28/2006                                 OPERATIVE REPORT   PREOP DIAGNOSIS:  Three painful pins right hip.   POSTOP DIAGNOSIS:  Three painful pins right hip.   OPERATION:  Removal of the 3 pins lateral right hip.   SURGEON:  Marlowe Kays, M.D.   ASSISTANT:  Nurse.   ANESTHESIA:  General.   INDICATIONS FOR PROCEDURE:  She is known to have a bad back and some pain as  a result of it, but feels that the major pain is due to the pins in her hip  and she is very tender over them.  She has less than optimum cardiac status;  and was cleared for surgery, from this point of view, but it is felt by her;  and her family that removal of the pins would be a much less stressful  procedure; and if this would give her substantial pain relief without having  to do extensive back surgery, then this would be preferred.   PROCEDURE:  Satisfactory general anesthesia, placed supine on a regular OR  table.  The lateral right hip was prepped with DuraPrep and squared off and  draped in a sterile field.  Ioban employed.  I used the C-arm to localize  the site of the pins, and made a short incision which I then elongated to  about a total of 3 inches; working my way down through the fascia lata, and  vastus lateralis to the pins which were then removed.  The wound was  irrigated with sterile saline.  Muscle and fascia were closed as a unit with  interrupted #1 Vicryl as was the deep subcutaneous tissue.  Superficial  subcutaneous tissue with 2-0 Vicryl, skin with staples.  Betamethasone  Adaptic dry, sterile dressings were applied.  She tolerated the procedure  well; and was taken to recovery in  satisfactory condition with no known  complications.           ______________________________  Marlowe Kays, M.D.     JA/MEDQ  D:  07/27/2006  T:  07/28/2006  Job:  [id number]

## 2011-04-21 NOTE — Op Note (Signed)
[name] [name], [name] [name]                             ACCOUNT NO.:  [id number]   MEDICAL RECORD NO.:  [id number]                   PATIENT TYPE:  OIB   LOCATION:  2907                                 FACILITY:  [facility]   PHYSICIAN:  Doylene Canning. Ladona Ridgel, M.D.               DATE OF BIRTH:  July 04, 1921   DATE OF PROCEDURE:  01/05/2004  DATE OF DISCHARGE:                                 OPERATIVE REPORT   PROCEDURE PERFORMED:  Atrial and defibrillation lead revision.   INDICATIONS:  Microscopic lead perforation following biventricular  implantable cardioverter-defibrillator insertion.   INTRODUCTION:  The patient is an 77 year old woman with a history of  congestive heart failure with predominant nonischemic cardiomyopathy.  She  has a left bundle branch block.  She also has coronary disease.  She was  referred for bi-V ICD insertion, and this was carried out on January 04, 2004.  Several hours postoperatively the patient developed severe pleuritic  right-sided chest pain.  She underwent 2 D echocardiography and chest x-ray,  which demonstrated no evidence of a pneumothorax.  She did have a small  pericardial effusion and on exam, a pericardial friction rub was present.  The patient is now referred for lead revision.  Of note, the patient's  thresholds were not significantly changed from implant.   DESCRIPTION OF PROCEDURE:  After informed consent was obtained, the patient  was taken to the diagnostic EP lab in the fasting state.  After the usual  preparation and draping, a total of 30 mL of lidocaine was infiltrated into  the left infraclavicular region.  The previously-made 10 cm incision was  reopened and electrocautery utilized to dissect down to the fascial plane.  The previously-implanted Guidant defibrillator was removed and the right  atrial, left ventricular, and defibrillation leads disconnected from the  device.  In the atrial lead was placed a stylet, and the lead was unscrewed  from  the myocardial surface.  The patient's pain did not change.  At this  point the defibrillation lead, which was placed in the RV apex, was also  unscrewed and placed on the RV septum, and this lead was actively fixed.  R-  waves measured 13 mV, and the pacing impedance was stable at 840 Ohms with a  pacing threshold of 0.5 at 0.5.  The 10-volt pacing from the defibrillation  lead did not stimulate the diaphragm.  With the defibrillation lead in  satisfactory position, attention was then turned to the atrial lead.  It was  placed in the right atrial appendage, where the P-waves measured between 1.3  and 1.7 mV.  The pacing impedance with the lead actively fixed was 600 Ohms  and stable.  The pacing threshold was initially quite elevated but over  approximately 10 minutes was reduced down to 1.4 volts at 0.5 msec.  Ten-  volt pacing did not stimulate the diaphragm from the right  atrium.  At this  point the previously-implanted Guidant defibrillator was reconnected to the  right atrial, ICD, and left ventricular leads and placed back in the  subcutaneous pocket.  Prior to this kanamycin was utilized to irrigate the  incision and electrocautery utilized to assure hemostasis.  The patient was  then deeply sedated and defibrillation threshold testing carried out  secondary to the new lead position of the defibrillation lead.   After the patient was more deeply sedated with fentanyl and Versed, VF was  induced with a T-wave shock.  A 14-joule shock was delivered, terminating  ventricular fibrillation and restoring sinus rhythm.  Five minutes was  allowed to elapse, then a second DFT test carried out.  Again after VF was  induced with a T-wave shock, an 11-joule shock was delivered, terminating  ventricular fibrillation and restoring sinus rhythm.  At this point no  additional defibrillation testing was carried out and additional kanamycin  was utilized to irrigate the incision, and the incision  then closed with a  layer of 2-0 Vicryl, followed by a layer of 3-0 Vicryl, followed by a layer  of 4-0 Vicryl.  Benzoin was painted on the skin and Steri-Strips were  applied and a pressure dressing placed and the patient returned to her room  in satisfactory condition.   COMPLICATIONS:  There were no immediate procedural complications.   RESULTS:  This demonstrates successful right atrial and defibrillation lead  revision in a patient with microscopic perforation complicating her initial  biventricular implantable cardioverter-defibrillator implant.  The patient  will be treated with pain medicines and anti-inflammatories.                                               Doylene Canning. Ladona Ridgel, M.D.    GWT/MEDQ  D:  01/05/2004  T:  01/06/2004  Job:  [id number]   cc:   Thomas C. Wall, M.D.   James L. Deterding, M.D.  [address]  [place]  [place] [address]  Fax: [phone]   Kathrine Cords, R.N. [facility]

## 2011-04-21 NOTE — Assessment & Plan Note (Signed)
[facility]                         ELECTROPHYSIOLOGY OFFICE NOTE   NAME:Gaspari, REIKA CALLANAN                        MRN:          [id number]  DATE:03/07/2007                            DOB:          Jun 28, 1921    Ms. Bohan was seen today in the clinic on the 3rd of April, 2008, for  followup of her Guidant Model [phone] Contact.  Date of implant was  January 04, 2004, for congestive heart failure.  On interrogation of her  device today her battery voltage is 2.67, which is middle of life 1,  with a charge time of 8.2 seconds.  P waves measured 2.1 millivolts with  an atrial capture threshold of 0.4 volts at 0.5 milliseconds and an  atrial lead impedance of 450 ohms.  Right ventricular R waves measured  11.8 millivolts with a right ventricular pacing threshold of 0.8 volts  at 0.5 milliseconds and a right ventricular lead impedance of 766 ohms.  Left ventricular R waves measured 5.1 to 6.4 millivolts.  This is down  from the last time at office visit recorded in January of 2007, her R  waves measured 11 millivolts; although, the impedance is stable at 136  ohms.  Threshold today in left ventricle was 1.4 volts at 0.5  milliseconds and last time we checked the threshold back in April of  2007 it was 1 at 0.5.  There were no episodes recorded, no changes were  made in her parameters.  She will continue with her [facility]  transmissions with a return office visit in January of 2009.      Altha Harm, LPN  Electronically Signed      Doylene Canning. Ladona Ridgel, MD  Electronically Signed   PO/MedQ  DD: 03/07/2007  DT: 03/07/2007  Job #: [id number]

## 2011-04-21 NOTE — Discharge Summary (Signed)
NAMEROBERTA, ANGELL                             ACCOUNT NO.:  [id number]   MEDICAL RECORD NO.:  [id number]                   PATIENT TYPE:  OIB   LOCATION:  2907                                 FACILITY:  [facility]   PHYSICIAN:  Duke Salvia, M.D.               DATE OF BIRTH:  01-Jun-1921   DATE OF ADMISSION:  01/04/2004  DATE OF DISCHARGE:  01/06/2004                                 DISCHARGE SUMMARY   PRIMARY DIAGNOSIS:  Ischemic cardiomyopathy.   HISTORY OF PRESENT ILLNESS:  This is an 77 year old female with a history of  ischemic cardiomyopathy that has been present since 1995.  Catheterization  at that time showed EF __________, significant but nonobstructive coronary  disease.  Cardiolite scanning further supported this as nonischemic  cardiomyopathy with evidence of acute abnormalities.  Initial treatment was      Chinita Pester, C.R.N.P. [facility]                 Duke Salvia, M.D.    DS/MEDQ  D:  01/06/2004  T:  01/06/2004  Job:  [id number]   cc:   Thomas C. Wall, M.D.   Merlene Laughter. Renae Gloss, M.D.  [address]  [address]  [place]  [place] [address]  Fax: [phone]

## 2011-04-21 NOTE — Assessment & Plan Note (Signed)
HEALTHCARE                            [place] OFFICE NOTE   NAME:[name] [name], [name] [name]                        MRN:          [id number]  DATE:12/05/2006                            DOB:          22-Oct-1921    SUBJECTIVE:  [name] [name] returns today for further management of her non-  ischemic cardiomyopathy.  She has a bi-ventricular pacer and is on  optimum medical therapy.   Back on Apr 27, 2006, her blood pressure was elevated at 144/76.  She  was having some peripheral edema and was having a lot of dyspnea on  exertion.   We have increased her Coreg and it is now at 12.5 mg b.i.d.  On her last  visit she seemed to be doing better.   She offers no complaints today cardiovascular-wise.  She has no  orthopnea, PND or peripheral edema.  Her dyspnea on exertion is  improved.  Her biggest problems are orthopedic, including her neck and  her low back.  She did not have surgery as scheduled when I saw her last  time.   MEDICATIONS:  1. A multivitamin q.d.  2. Calcium 600 mg b.i.d.  3. Fish oil 1000 mg daily.  4. Aciphex 20 mg q.d.  5. Lanoxin 0.125 mg q.d.  6. Lasix 60 mg q.d.  7. Potassium 20 mEq q.d.  8. Vytorin 10/40 mg q.d.  9. Aspirin 81 mg q.d.  10.Tylenol Arthritis p.r.n.  11.Vasotec 10 mg p.o. b.i.d.  12.Coreg 12.5 mg b.i.d.  13.Lyrica 25 mg q.h.s.   LABORATORY DATA:  Her hypertension and hyperlipidemia are being followed  by Dr. Merlene Laughter. [place].   PHYSICAL EXAMINATION:  VITAL SIGNS:  Blood pressure 113/68, pulse 66, in  sinus rhythm with bi-ventricular pacing.  Her weight is 143 pounds,  which is stable, respirations 18.  SKIN:  Warm and dry.  GENERAL:  She is in no acute distress.  HEENT:  Normocephalic and atraumatic.  Sclerae slightly injected.  Pupils equal, round, reactive to light and accommodation.  Facial  symmetry is normal.  NECK:  There is no jugular venous distention.  Carotid upstrokes are  equal bilaterally without  bruits.  There is no thyromegaly.  Trachea is  midline.  LUNGS:  Clear to auscultation.  HEART:  Reveals a slightly-displaced PMI.  There is no gallop.  S2 is  physiologically split.  ABDOMEN:  Soft, with good bowel sounds.  It is non-distended.  EXTREMITIES:  No edema.  Pulses are present.  NEUROLOGIC:  Intact.  SKIN:  A few ecchymoses, otherwise benign.  MUSCULOSKELETAL:  Significant scoliosis of her spine and some tenderness  in her low back.   ASSESSMENT:  [name] [name] is stable from her non-ischemic cardiomyopathy.  She is a lot better than she was functionally back in May 2007.  Her  biggest problem is orthopedics.   PLAN:  1. I have asked her to continue her current medications.  2. I have also encouraged her to talk to a good physical therapist or      rehab professional about exercise that will strengthen her  back and      reduce her pain.  3. I will plan on seeing her back in six months.     Thomas C. Daleen Squibb, MD, [facility]  Electronically Signed    TCW/MedQ  DD: 12/05/2006  DT: 12/05/2006  Job #: [id number]   cc:   Merlene Laughter. Renae Gloss, M.D.

## 2011-04-21 NOTE — Cardiovascular Report (Signed)
[place]. [facility]  Patient:    [name] [name], [name] [name]                          MRN: [id number] Proc. Date: 05/02/01 Adm. Date:  [id number] Attending:  Mirian Mo CC:         Aletha Halim, M.D. in [place], [place]  Corinna Lines, M.D. in [place], [place] C. Daleen Squibb, M.D. [facility]  James L. Deterding, M.D.  Arturo Morton. Riley Kill, M.D. [facility]   Cardiac Catheterization  PROCEDURE:  Right and left heart catheterization.  INDICATION:  Ms. Alumbaugh is 77 years old and has documented coronary disease which has been mostly nonobstructive and had documented left ventricular dysfunction and a history of congestive heart failure.  She recently has had increased symptoms of chest pain and had a Cardiolite scan done in [place], [place] which suggested apical scar with some superimposed ischemia and an ejection fraction of 17%.  For this reason, she was scheduled for admission here for evaluation with catheterization.  She had chest pain this afternoon and so her catheterization was moved up to an urgent basis.  DESCRIPTION OF PROCEDURE:  Right heart catheterization was performed percutaneously by the right femoral vein using a medium sheath and Swan-Ganz thermodilution catheter.  Left heart catheterization was performed percutaneously by the right femoral artery using arterial sheath and 6 French preformed coronary catheters.  A distal aortogram was performed to assess the feasibility of balloon pump should she require intervention.  The patient tolerated the procedure well and left the lab in satisfactory condition.  RESULTS:  Angiographic data: 1. Left main coronary artery:  Free of significant disease. 2. Left anterior descending coronary artery:  Gave rise to two diagonal branch    and four septal perforators.  There was 70% narrowing in the proximal LAD    after the second diagonal branch.  There was 80% ostial narrowing in the    first and second diagonal  branches.  The rest of the vessel was free of    major obstruction. 3. Circumflex artery:  Gave rise to a large intermediate branch and AV branch    was terminally in the posterolateral branch.  These vessels were free of    significant disease. 4. Right coronary artery:  Moderate size vessel which gave rise to a right    ventricular branch, posterior descending branch, and a posterolateral    branch.  There was 30% narrowing in the mid-right coronary artery. 5. Left ventriculogram performed in the RAO projection showed global    hypokinesis with an estimated ejection fraction of 20%. 6. 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  There was no major aortic obstruction.  Hemodynamic data: 1. The right atrial pressure was 4. 2. The right ventricular pressure was 23/5. 3. The pulmonary artery pressure was 23/9 with a mean of 14. 4. The pulmonary wedge pressure was 6. 5. Left ventricular pressure was 127/11 and the aortic pressure was 127/67.  CONCLUSION: 1. Coronary artery with 70% stenosis in the proximal left anterior descending    coronary artery, 80% ostial stenosis in the first and second diagonal    branches, no major obstruction of the circumflex artery, and 30% narrowing    in the right coronary artery. 2. Severe left ventricular dysfunction out of proportion to the coronary  disease with an ejection fraction of 20% consistent with a nonischemic    cardiomyopathy.  RECOMMENDATIONS:  It is not clear whether the patients recent chest pain is related to her coronary artery disease or not.  The lesions appear borderline for causing ischemia.  A percutaneous intervention would be somewhat technically difficult because of the branch stenosis but is doable.  I will review with my colleague but my initial impression is that we should probably plan medical treatment  and reserve intervention for a recurrent ischemic symptoms that are not responsive to medical treatment.  If we do consider intervention, we might want to consider further physiologic testing first.  DD:  05/02/01 TD:  05/02/01 Job: [id number]

## 2011-04-21 NOTE — Discharge Summary (Signed)
[place]. [place]  Patient:    [name] [name], [name] [name]                          MRN: [id number] Adm. Date:  [id number] Disc. Date: 05/04/01 Attending:  Mirian Mo Dictator:   Abelino Derrick, P.A.C. [facility] CC:         James L. Deterding, M.D.  Duane Lope. Judithann Sheen, M.D., [place], [place]   Referring Physician Discharge [place]  DISCHARGE DIAGNOSES: 1. Coronary disease with moderate left anterior descending and diagonal    disease by catheterization this admission. 2. Left ventricular dysfunction, not felt to be ischemic, with an ejection    fraction of 20% and global hypokinesis. 3. Nonsustained ventricular tachycardia, electrophysiologic evaluation this    admission by Dr. Doylene Canning. Ladona Ridgel. 4. Left bundle branch block. 5. Hyperlipidemia. 6. History of gastric reflux.  HOSPITAL COURSE:  Patient is a delightful 77 year old female who his Dr. Fayrene Fearing L. Deterdings mother-in-law.  She had been followed previously by ______ .  She has had previous catheterization showing nonobstructive coronary disease.  She had a Cardiolite scan, Apr 24, 2001, which showed an EF of 17-20%, with an anterior apical scar with ischemia.  She was admitted after she complained of some exertional chest pain.  She was seen by Dr. Maisie Fus C. Wall on admission and set up for a catheterization.  Patient was started on heparin.  There was concern she may have had an out-of-hospital MI. CK-MBs and troponins were negative.  Catheterization was done by Dr. Everardo Beals. Juanda Chance, May 02, 2001, which revealed a 30% mid RCA, normal circumflex, normal left main, 70% LAD after the second diagonal and an 80% first diagonal narrowing and 80% second diagonal narrowing.  Her EF was 20% with global hypokinesis.  She had ectatic but patent renal arteries.  It was felt that her cardiomyopathy was probably not related to coronary disease. She did have some nonsustained ventricular tachycardia with three- and four-beat  runs and was seen by Dr. Ladona Ridgel for EP evaluation prior to discharge.  He felt also that mechanism of her cardiomyopathy was not related to ischemia.  He did note that the Cardiolite revealed apical scar but this was not noted at left ventriculogram at her catheterization.  After review with Dr. Juanda Chance, Dr. Ladona Ridgel and Dr. Rollene Rotunda, the plan is for continued medical therapy for now.  Ultimately, it was decided that the indication and potential benefit for an ICD or biventricular pacing were not clear-cut.  We did make some adjustments in her medications prior to discharge, changing Toprol-XL to Coreg and adding Lipitor.  We feel she can be discharged May 04, 2001 and will follow up with Dr. Daleen Squibb, at the patients request, June 18th at 10:30 a.m.  DISCHARGE MEDICATIONS: 1. Coreg 6.25 mg b.i.d. 2. Lipitor 10 mg a day. 3. Vasotec 5 mg b.i.d. 4. Coated aspirin q.d. 5. Lasix 60 mg a day. 6. Potassium 20 mEq a day. 7. Aciphex 20 mg a day. 8. Lanoxin 0.125 mg q.d. 9. Nitroglycerin sublingual p.r.n.  LABORATORY DATA:  WBC 5.0, hemoglobin 13.6, hematocrit 39, platelets 169,000. INR is 1.2.  Sodium 142, potassium 3.9, BUN 13, creatinine 0.9.  Liver functions are normal.  CK-MBs and troponins are negative.  Lipid profile showed a cholesterol of 212, triglycerides 250, HDL 37, LDL 125.  TSH 4.72. Digoxin level was 0.4 on admission.  Portable chest x-ray revealed mild cardiomegaly, no acute disease.  EKG  shows sinus rhythm, left bundle branch block, PVCs.  DISPOSITION:  Patient is discharged in stable condition and will follow up with Dr. Daleen Squibb on June 18th at 10:30 a.m. DD:  05/04/01 TD:  05/04/01 Job: [id number]

## 2011-04-21 NOTE — Discharge Summary (Signed)
NAMEKHRISTIAN, SEALS                             ACCOUNT NO.:  [id number]   MEDICAL RECORD NO.:  [id number]                   PATIENT TYPE:  IPS   LOCATION:  4146                                 FACILITY:  [facility]   PHYSICIAN:  Erick Colace, M.D.           DATE OF BIRTH:  Sep 14, 1921   DATE OF ADMISSION:  02/24/2004  DATE OF DISCHARGE:  03/01/2004                                 DISCHARGE SUMMARY   DISCHARGE DIAGNOSES:  1. Right hip open reduction and internal fixation.  2. Hypertension.  3. History of atrial fibrillation/ventricular tachycardia with permanent     pacemaker.   HISTORY OF PRESENT ILLNESS:  [name] is an 77 year old female with  history of coronary artery disease and arrhythmia, admitted March 19 to  [facility] __________ with right femoral neck fracture.  She  underwent percutaneous internal fixation March 20, by Dr. Erby Pian.  Postop  is partially weightbearing and on Lovenox for DVT prophylaxis.  Once therapy  was initiated, the patient was noted to be min assist for transfers, min  assist ambulating 40 feet requiring constant cues for sequencing.   PAST MEDICAL HISTORY:  1. Coronary artery disease, nonischemic cardiomyopathy and congestive heart     failure.  2. Peripheral vascular disease.  3. Cholecystectomy.  4. History of partial right nephrectomy with for abscess.  5. Appendectomy.  6. Bilateral elbow fractures with right elbow open reduction and internal     fixation.  7. 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  8. GERD.   ALLERGIES:  MORPHINE, CODEINE AND ASPIRIN.   SOCIAL HISTORY:  The patient is a retired Engineer, civil (consulting), married and living in two-  level home with three steps at entry.  Stays on the first level.  She was  independent prior to admission.  She does not use any tobacco or alcohol.   HOSPITAL COURSE:  Mrs. Emelda Kohlbeck was admitted to rehab on February 24, 2004  for inpatient therapy to consist of PT/OT daily.  Prior  to admission she was  maintained on Lovenox DVT prophylaxis.  Pain control was  reasonable.  The  patient has fear of using too much narcotics.  She did consent to using  Ultram one-half to one on a q.i.d. basis p.r.n. pain.  Labs were done prior  to admission hemoglobin 12.9, hematocrit 38, white count 4.7, platelets 189.  Sodium 132, potassium 3.8, chloride 99, CO2 30, BUN 13, creatinine 0.8,  glucose 107.  Urine culture showed no growth.  The patient had a small right  hip incision that healed well without any signs or symptoms of infection or  discharge.  Sutures were discontinued and the area steri-stripped and  dressed without difficulty.   The patient during this stay had problems with some dizziness.  She had  orthostatic blood pressures were checked and the patient would drop her  blood pressure from 127/71 lying to 106/38  seated.  She was extremely dizzy  when standing and BP medications were held and abdominal binder and thigh-  high TEDs were ordered for support.  As the patient's mobility improved, the  dizziness resolved.  Blood pressure medicines were slowly resumed.  Currently, the patient remains on Coreg, digoxin, and Lasix.  Vasotec  continues to be held with blood pressures running at 115-130 systolic, 40's-  16'X diastolic.  The patient to follow up with LMD in two weeks for which a  blood pressure and full resumption of Vasotec.  During her stay in rehab,  Mrs. Dugo has made good progress.  Currently she is at modified  independent level for bed mobility, modified independent for showering,  modified independent for dressing.  She requires supervision with donning  stockings.  She is modified independent for transfers, modified independent  for ambulating short distances with a walker. Further followup therapy with  home health PT/OT home care providers.   DISCHARGE MEDICATIONS:  1. Lasix 40 mg a day.  2. Lanoxin 0.125 mg a day.  3. Aciphex 20 mg a day.  4.  K-Dur 10 mEq a day.  5. Coreg 6.25 mg b.i.d.  6. Zocor 40 mg at night.  7. Senokot-S two p.o. at night.  8. Ultram 50 mg one-half to one p.o. q.i.d. p.r.n. pain.  9. Coated aspirin 81 mg a day.   ACTIVITY:  Partial weightbearing right lower extremity.  Use walker.   WOUND CARE:  Keep clean and dry.   SPECIAL INSTRUCTIONS:  No alcohol, no smoking, no driving.  Do not use  Vasotec for now.   FOLLOW UP:  1. The patient to follow up with Dr. Katrinka Blazing for postoperative check in two to     three weeks.  2. Follow up with Dr. Renae Gloss in two weeks for recheck of blood pressure.  3. Follow up with Dr. Wynn Banker as needed.      Greg Cutter, P.A.                    Erick Colace, M.D.    PP/MEDQ  D:  03/01/2004  T:  03/01/2004  Job:  [id number]   cc:   C. M.D. [facility] [employer]. Renae Gloss, M.D.  [address]  [address]  [place]  [place] [address]  Fax: [phone]

## 2011-04-21 NOTE — Op Note (Signed)
NAMENIZHONI, PARLOW                             ACCOUNT NO.:  [id number]   MEDICAL RECORD NO.:  [id number]                   PATIENT TYPE:  OIB   LOCATION:  2899                                 FACILITY:  [facility]   PHYSICIAN:  Duke Salvia, M.D.               DATE OF BIRTH:  12-Jul-1921   DATE OF PROCEDURE:  01/04/2004  DATE OF DISCHARGE:                                 OPERATIVE REPORT   PREOPERATIVE DIAGNOSIS:  Congestive heart failure, left bundle branch block,  nonischemic cardiomyopathy with complex ventricular ectopy.   POSTOPERATIVE DIAGNOSIS:  Congestive heart failure, left bundle branch  block, nonischemic cardiomyopathy with complex ventricular ectopy.   PROCEDURE:  Dual chamber pacemaker implantation with biventricular lead  insertion, intraoperative defibrillation threshold testing, and contrast  venography of the left upper extremity.   DESCRIPTION OF PROCEDURE:  Following obtaining of informed consent, the  patient was brought to the electrophysiology laboratory and placed on the  fluoroscopic table in the supine position. After routine prep and drape of  the left upper chest, lidocaine was infiltrated in the prepectoral  subclavicular region.  An incision was made and carried down to the layer of  the prepectoral fascia using electrocautery and sharp dissection.  A pocket  was formed similarly.  Hemostasis was obtained.  Thereafter, attention was  turned to gaining access to the extrathoracic left subclavian vein which was  accomplished without difficulty and without the aspiration of air or  puncture of the artery.  Three separate venipunctures were accomplished, two  were more cephalad than the other and they were encircled in a 0 silk suture  which was allowed to hang loosely.  Initially a 9 French tear away  introducer sheath was placed through which was passed a 0158 dual cord  defibrillator lead serial number [id number].  This Guidant lead was passed under  fluoroscopic guidance to the right ventricular apex where it was actively  fixated to the apex.  At this location, the bipolar R wave was 7.5  millivolts with a pacing impedance of 0.4 volts at 0.4 milliseconds, current  threshold of 0.4 MA and the impedance of 1174 ohms. There was no  diaphragmatic pacing at 10 volts.   This lead was then secured to the prepectoral fascia and through the caudal  most wire, a 9.5 French tear away introducer sheath was placed through which  was then passed a Guidant extended hook coronary sinus access catheter.  This was used in conjunction with a Wooley wire and the coronary sinus was  cannulated.  Immediately upon deployment of the wire.  However, the sheath  was unable to pass over the wire and the system became displaced.  The  coronary sinus was recannulated with the wire.  A second wire was placed,  but this also failed to allow passage of the sheath over the wires.  One of  these wires was then  removed and the venography balloon was then used to  stabilize the system.  The coronary sinus was recannulated and the balloon  allowed for passage then of the sheath over into the midportion of the  coronary sinus.  Venography was undertaken.   At this point, a whisper wire was utilized in conjunction with an Easy Trak  II serial number [id number] Guidant bipolar left ventricular lead serial number  [id number].  After some manipulation, a lateral branch was cannulated.  The  posterolateral branch that came off this same origin was too small to even  take the passage of the wire.  This allowed [place] to place the wire in a  midposition between base and apex on the lateral wall.  In this location,  the bipolar L wave was 14.7 millivolts with a pacing impedance of 1148 ohms  and a pacing threshold of 0.6 volts at 0.5 milliseconds.  Current threshold  was 0.5 MA and there was again no diaphragmatic pacing at 10 volts.   The axis sheath was removed, the deployment system was  left in place, and  over the last retained guide wire, a 7 [place] J wire introducer sheath was  placed through which was passed a Medtronic [id number] 2 cm active fixation  atrial lead serial number [id number].  This lead was placed on the right  atrial free wall where the bipolar P wave was 1.6 millivolts with a pacing  impedance of 953 ohms and a pacing threshold of 0.8 volts at 0.5  milliseconds.  Current threshold was 0.9 MA and again there was no  diaphragmatic pacing at 10 volts.   With these parameters recorded, the atrial lead was secured and attention  was then turned to removing the left ventricular deployment system which was  accomplished without significant displacement of the lead.  There was some  concern about this because there was a large upward bend of the innominate  at the junction with the SVC which had eaten a bunch of lead earlier in  the procedure.   The left ventricular lead was then secured to the prepectoral fascia and the  leads were then attached to a Guidant Renewal III H175 triple chamber  defibrillator serial number [id number].  Through the device the bipolar P wave  was 1.0 millivolts with a pacing impedance of 548 ohms and a pacing  threshold of 0.4 volts at 0.5 milliseconds.  The bipolar R wave was 12.6  millivolts with a pacing impedance of 890 ohms, a pacing threshold of 0.2  volts at 0.5 milliseconds.  The bipolar L wave was 4.3 millivolts with a  pacing impedance of 926 ohms and a pacing threshold of 0.8 volts at 0.5  milliseconds.  It was my impression that the 4.3 millivolts was right after  contact and that the recorded L wave was actually 12 to 13 millivolts.  The  high voltage impedance was 42 ohms.   With these acceptable parameters recorded, the system was subjected to  intraoperative defibrillation threshold testing.  Ventricular fibrillation  was induced via the T wave shock.  After a total duration of 7.5 seconds, a 21 joule shock was delivered  through a measured resistance of 37 ohms,  terminating ventricular fibrillation and restoring sinus rhythm.   After a wait of 5 to 6 minutes, ventricular fibrillation was reinduced via  the T wave shock.  After a total duration of 7 to 8 seconds, a 17 joule  shock was delivered through a measured resistance of  37 ohms, terminating  ventricular fibrillation and restoring sinus rhythm.  At this point, the  system was implanted.  The pocket was copiously irrigated with antibiotic  containing saline solution, hemostasis was assured including placing of  hemostatic suture around the three leads.  The leads and the pulse  generator, having been placed in the pocket, were then secured to the  prepectoral fascia.  The wound was closed in three layers in the normal  fashion.  The wound was washed, dried, and a Benzoin Steri-Strip dressing  was applied.  Needle, sponge, and instrument counts correct at the end of  the procedure according to the staff.   The patient tolerated the procedure without apparent complications.                                               Duke Salvia, M.D.    SCK/MEDQ  D:  01/04/2004  T:  01/04/2004  Job:  [id number]   cc:   [facility]   [place]. Renae Gloss, M.D.  [address]  [address]  [place]  [place] [address]  Fax: [phone]

## 2011-04-25 ENCOUNTER — Ambulatory Visit
Admission: RE | Admit: 2011-04-25 | Discharge: 2011-04-25 | Disposition: A | Discharge status: Home / Self Care | Payer: Medicare Other | Source: Ambulatory Visit | Attending: Orthopedic Surgery | Admitting: Orthopedic Surgery

## 2011-04-25 DIAGNOSIS — M4800 Spinal stenosis, site unspecified: Secondary | ICD-10-CM

## 2011-04-27 ENCOUNTER — Encounter: Payer: Self-pay | Admitting: Cardiology

## 2011-05-03 ENCOUNTER — Telehealth: Payer: Self-pay | Admitting: Cardiology

## 2011-05-03 ENCOUNTER — Ambulatory Visit (INDEPENDENT_AMBULATORY_CARE_PROVIDER_SITE_OTHER): Payer: Medicare Other | Admitting: Cardiology

## 2011-05-03 ENCOUNTER — Encounter: Payer: Self-pay | Admitting: Cardiology

## 2011-05-03 VITALS — BP 109/61 | HR 71 | Ht 62.0 in | Wt 134.0 lb

## 2011-05-03 DIAGNOSIS — I5022 Chronic systolic (congestive) heart failure: Secondary | ICD-10-CM

## 2011-05-03 LAB — BASIC METABOLIC PANEL
BUN: 19 mg/dL (ref 6–23)
Chloride: 99 mEq/L (ref 96–112)
Glucose, Bld: 87 mg/dL (ref 70–99)
Potassium: 3.6 mEq/L (ref 3.5–5.1)

## 2011-05-03 MED ORDER — DIGOXIN 125 MCG PO TABS: 125.0000 ug | ORAL_TABLET | Freq: Every day | ORAL | Status: DC

## 2011-05-03 MED ORDER — CARVEDILOL 12.5 MG PO TABS: 12.5000 mg | ORAL_TABLET | Freq: Two times a day (BID) | ORAL | Status: DC

## 2011-05-03 NOTE — Patient Instructions (Signed)
Your physician recommends that you have lab work today bmet, and digoxin level Your physician recommends that you schedule a follow-up appointment in: 6 months with Dr. Daleen Squibb

## 2011-05-03 NOTE — Telephone Encounter (Signed)
Pt needs coreg and digoxin to be call in to [facility] # [phone]

## 2011-05-03 NOTE — Assessment & Plan Note (Signed)
She is stable. Medications reviewed. She is very compliant. We'll check electrolytes and digoxin level. Followup in 6 months.

## 2011-05-03 NOTE — Progress Notes (Signed)
HPI   [name] [name] returns today for evaluation and management of her chronic systolic congestive heart failure. She has no complaints today except for back and neck problems. She is scheduled to receive some injections for help. Her weight has been stable. She denies palpitations, chest pain, orthopnea, or edema. Her dyspnea on exertion is stable.  Past Medical History  Diagnosis Date  . Pacemaker     Biventricular, [employer]   . CAD in native artery   . LBBB (left bundle branch block)   . Chronic systolic heart failure   . Cardiomyopathy, primary   . Hypertension   . Hyperlipidemia   . Urticaria   . Urinary incontinence   . Osteoarthritis   . Spinal stenosis, lumbar     Past Surgical History  Procedure Date  . Pacemaker insertion     Biventricular, [employer], ICD   . Amputation     distal left 3rd and 4th fingers as child   . Appendectomy   . Cholecystectomy   . Tonsillectomy   . Kidney surgery     right kidney procdeure, 1970's   . Hip fracture surgery 02/2004     right hip     No family history on file.  History   Social History  . Marital Status: Married    Spouse Name: N/A    Number of Children: 5  . Years of Education: N/A   Occupational History  . retired     Horticulturist, commercial   Social History Main Topics  . Smoking status: Former Smoker    Quit date: 12/05/1955  . Smokeless tobacco: Never Used  . Alcohol Use: No  . Drug Use: Not on file  . Sexually Active: Not on file   Other Topics Concern  . Not on file   Social History Narrative   Has living will.  Discussed DNR- she requests this ("if the Lord decided to take me, just let me go"). Wouldn't want feeding tube if not cognitively aware. Asks that husband, than daughter Schotsi     Allergies  Allergen Reactions  . Codeine   . Morphine   . Propoxyphene N-Acetaminophen     REACTION: used to work, lately she would get sick    . Current Outpatient Prescriptions    Medication Sig Dispense Refill  . acetaminophen (TYLENOL) 500 MG tablet Take 1,000 mg by mouth 3 (three) times daily.        Marland Kitchen aspirin 81 MG tablet Take 81 mg by mouth daily.        . Calcium Carbonate-Vitamin D (CALCIUM 500+D PO) Take 1 tablet by mouth.        . carvedilol (COREG) 12.5 MG tablet Take 12.5 mg by mouth 2 (two) times daily with a meal.        . Cholecalciferol (VITAMIN D3) 1000 UNITS tablet Take 1,000 Units by mouth daily.        . digoxin (LANOXIN) 0.125 MG tablet Take 125 mcg by mouth daily.        . enalapril (VASOTEC) 20 MG tablet Take 20 mg by mouth 2 (two) times daily.        Marland Kitchen esomeprazole (NEXIUM) 20 MG capsule Take 20 mg by mouth daily.        Marland Kitchen ezetimibe-simvastatin (VYTORIN) 10-40 MG per tablet Take 1 tablet by mouth at bedtime.        . fish oil-omega-3 fatty acids 1000 MG capsule Take 2 capsules by mouth daily.        Marland Kitchen  furosemide (LASIX) 40 MG tablet Take 40 mg by mouth 3 (three) times daily.       . MULTIPLE VITAMIN PO Take 1 tablet by mouth daily.        . potassium chloride SA (K-DUR,KLOR-CON) 20 MEQ tablet Take 20 mEq by mouth daily.          ROS Negative other than HPI.   PE  General Appearance: well developed, well nourished in no acute distress HEENT: symmetrical face, PERRLA, good dentition  Neck: no JVD, thyromegaly, or adenopathy, trachea midline Chest: symmetric without deformity Cardiac: PMI non-displaced, RRR, normal S1,paradoxical S2, no gallop or murmur Lung: clear to ausculation and percussion Vascular: all pulses full without bruits  Abdominal: nondistended, nontender, good bowel sounds, no HSM, no bruits Extremities: no cyanosis, clubbing or edema, no sign of DVT, no varicosities  Skin: normal color, no rashes Neuro: alert and oriented x 3, non-focal Pysch: normal affect  Filed Vitals:   05/03/11 1015  BP: 109/61  Pulse: 71  Height: 5\' 2"  (1.575 m)  Weight: 134 lb (60.782 kg)    EKG  Labs and Studies Reviewed.   Lab Results   Component Value Date   WBC 5.6 03/10/2010   HGB 11.9* 03/10/2010   HCT 35.0* 03/10/2010   MCV 99.9 03/10/2010   PLT 132* 03/10/2010      Chemistry      Component Value Date/Time   NA 139 12/21/2010 1557   K 4.4 12/21/2010 1557   CL 100 12/21/2010 1557   CO2 31 12/21/2010 1557   BUN 21 12/21/2010 1557   CREATININE 0.9 12/21/2010 1557      Component Value Date/Time   CALCIUM 9.7 12/21/2010 1557   ALKPHOS 35* 12/16/2008 1254   AST 28 12/16/2008 1254   ALT 23 12/16/2008 1254   BILITOT 1.0 12/16/2008 1254       Lab Results  Component Value Date   CHOL 147 12/16/2008   Lab Results  Component Value Date   HDL 32.0* 12/16/2008   No results found for this basename: [facility]   Lab Results  Component Value Date   TRIG 213* 12/16/2008   Lab Results  Component Value Date   CHOLHDL 4.6 CALC 12/16/2008   No results found for this basename: HGBA1C   Lab Results  Component Value Date   ALT 23 12/16/2008   AST 28 12/16/2008   ALKPHOS 35* 12/16/2008   BILITOT 1.0 12/16/2008   Lab Results  Component Value Date   TSH 3.71 12/16/2008

## 2011-05-09 ENCOUNTER — Other Ambulatory Visit: Payer: Self-pay | Admitting: Orthopedic Surgery

## 2011-05-09 DIAGNOSIS — M542 Cervicalgia: Secondary | ICD-10-CM

## 2011-05-10 ENCOUNTER — Telehealth: Payer: Self-pay | Admitting: Cardiology

## 2011-05-10 ENCOUNTER — Ambulatory Visit
Admission: RE | Admit: 2011-05-10 | Discharge: 2011-05-10 | Disposition: A | Discharge status: Home / Self Care | Payer: Medicare Other | Source: Ambulatory Visit | Attending: Orthopedic Surgery | Admitting: Orthopedic Surgery

## 2011-05-10 DIAGNOSIS — M542 Cervicalgia: Secondary | ICD-10-CM

## 2011-05-10 NOTE — Telephone Encounter (Signed)
Pt got letter from [employer]. Stating she needed Vytorin 10/40 mg q d.  Pt does not have number to [employer].

## 2011-05-11 MED ORDER — EZETIMIBE-SIMVASTATIN 10-40 MG PO TABS: 1.0000 | ORAL_TABLET | Freq: Every day | ORAL | Status: AC

## 2011-05-11 NOTE — Telephone Encounter (Signed)
RX sent into pharmacy. No answer.

## 2011-07-24 ENCOUNTER — Ambulatory Visit (INDEPENDENT_AMBULATORY_CARE_PROVIDER_SITE_OTHER): Payer: Medicare Other | Admitting: Internal Medicine

## 2011-07-24 ENCOUNTER — Encounter: Payer: Self-pay | Admitting: Internal Medicine

## 2011-07-24 DIAGNOSIS — I428 Other cardiomyopathies: Secondary | ICD-10-CM

## 2011-07-24 DIAGNOSIS — Z45018 Encounter for adjustment and management of other part of cardiac pacemaker: Secondary | ICD-10-CM

## 2011-07-24 DIAGNOSIS — I5022 Chronic systolic (congestive) heart failure: Secondary | ICD-10-CM

## 2011-07-24 LAB — PACEMAKER DEVICE OBSERVATION
AL THRESHOLD: 0.2 V
ATRIAL PACING PM: 11
RV LEAD THRESHOLD: 1.2 V
VENTRICULAR PACING PM: 91

## 2011-07-24 NOTE — Assessment & Plan Note (Signed)
Her device is working normally. Will recheck in several months. 

## 2011-07-24 NOTE — Assessment & Plan Note (Signed)
Her symptoms remain well controlled. She will continue to remain active, maintain a low sodium diet and continue her current meds.

## 2011-07-24 NOTE — Progress Notes (Signed)
HPI [name] returns today for followup. She is a pleasant elderly woman with a h/o chronic systolic heart failure, LBBB, s/p BiV PPM. She has done well despite her advanced age except for some arthritis symptoms. No syncope. No peripheral edema or weight gain/loss.She has class 2 CHF symptoms. Allergies  Allergen Reactions  . Codeine   . Morphine   . Propoxyphene N-Acetaminophen     REACTION: used to work, lately she would get sick     Current Outpatient Prescriptions  Medication Sig Dispense Refill  . acetaminophen (TYLENOL) 500 MG tablet Take 1,000 mg by mouth 3 (three) times daily.        Marland Kitchen aspirin 81 MG tablet Take 81 mg by mouth daily.        . Calcium Carbonate-Vitamin D (CALCIUM 500+D PO) Take 1 tablet by mouth.        . carvedilol (COREG) 12.5 MG tablet Take 1 tablet (12.5 mg total) by mouth 2 (two) times daily with a meal.  180 tablet  3  . Cholecalciferol (VITAMIN D3) 1000 UNITS tablet Take 1,000 Units by mouth daily.        . digoxin (LANOXIN) 0.125 MG tablet Take 1 tablet (125 mcg total) by mouth daily.  90 tablet  3  . enalapril (VASOTEC) 20 MG tablet Take 20 mg by mouth 2 (two) times daily.        Marland Kitchen esomeprazole (NEXIUM) 20 MG capsule Take 20 mg by mouth daily.        Marland Kitchen ezetimibe-simvastatin (VYTORIN) 10-40 MG per tablet Take 1 tablet by mouth at bedtime.  90 tablet  3  . fish oil-omega-3 fatty acids 1000 MG capsule Take 2 capsules by mouth daily.        . furosemide (LASIX) 40 MG tablet Take 40 mg by mouth 3 (three) times daily.       . MULTIPLE VITAMIN PO Take 1 tablet by mouth daily.        . potassium chloride SA (K-DUR,KLOR-CON) 20 MEQ tablet Take 20 mEq by mouth daily.           Past Medical History  Diagnosis Date  . Pacemaker     Biventricular, [employer]   . CAD in native artery   . LBBB (left bundle branch block)   . Chronic systolic heart failure   . Cardiomyopathy, primary   . Hypertension   . Hyperlipidemia   . Urticaria   . Urinary  incontinence   . Osteoarthritis   . Spinal stenosis, lumbar     ROS:   All systems reviewed and negative except as noted in the HPI.   Past Surgical History  Procedure Date  . Pacemaker insertion     Biventricular, [employer], ICD   . Amputation     distal left 3rd and 4th fingers as child   . Appendectomy   . Cholecystectomy   . Tonsillectomy   . Kidney surgery     right kidney procdeure, 1970's   . Hip fracture surgery 02/2004     right hip      History reviewed. No pertinent family history.   History   Social History  . Marital Status: Married    Spouse Name: N/A    Number of Children: 5  . Years of Education: N/A   Occupational History  . retired     Horticulturist, commercial   Social History Main Topics  . Smoking status: Former Smoker    Quit date:  12/05/1955  . Smokeless tobacco: Never Used  . Alcohol Use: No  . Drug Use: Not on file  . Sexually Active: Not on file   Other Topics Concern  . Not on file   Social History Narrative   Has living will.  Discussed DNR- she requests this ("if the Lord decided to take me, just let me go"). Wouldn't want feeding tube if not cognitively aware. Asks that husband, than daughter Schotsi      BP 116/64  Pulse 65  Ht 5\' 2"  (1.575 m)  Wt 133 lb (60.328 kg)  BMI 24.33 kg/m2  Physical Exam:  Well appearing elderly woman,  NAD HEENT: Unremarkable Neck:  No JVD, no thyromegally Lymphatics:  No adenopathy Back:  No CVA tenderness Lungs:  Clear with no wheezes, rales, or rhonchi. Well healed PPM incision. HEART:  Regular rate rhythm, no murmurs, no rubs, no clicks Abd:  soft, positive bowel sounds, no organomegally, no rebound, no guarding Ext:  2 plus pulses, no edema, no cyanosis, no clubbing Skin:  No rashes no nodules Neuro:  CN II through XII intact, motor grossly intact  DEVICE  Normal device function.  See PaceArt for details.   Assess/Plan:

## 2011-07-24 NOTE — Patient Instructions (Signed)
Your physician recommends that you schedule a follow-up appointment in: 1 year  

## 2011-10-14 ENCOUNTER — Other Ambulatory Visit: Payer: Self-pay | Admitting: Cardiology

## 2011-10-20 ENCOUNTER — Ambulatory Visit (INDEPENDENT_AMBULATORY_CARE_PROVIDER_SITE_OTHER): Payer: Medicare Other | Admitting: Cardiology

## 2011-10-20 ENCOUNTER — Encounter: Payer: Self-pay | Admitting: Cardiology

## 2011-10-20 VITALS — BP 102/44 | HR 64 | Ht 60.0 in | Wt 132.0 lb

## 2011-10-20 DIAGNOSIS — I428 Other cardiomyopathies: Secondary | ICD-10-CM

## 2011-10-20 DIAGNOSIS — I5022 Chronic systolic (congestive) heart failure: Secondary | ICD-10-CM

## 2011-10-20 DIAGNOSIS — I447 Left bundle-branch block, unspecified: Secondary | ICD-10-CM

## 2011-10-20 DIAGNOSIS — I2510 Atherosclerotic heart disease of native coronary artery without angina pectoris: Secondary | ICD-10-CM

## 2011-10-20 LAB — BASIC METABOLIC PANEL
Chloride: 103 mEq/L (ref 96–112)
GFR: 74.03 mL/min (ref >60.00)
Potassium: 4 mEq/L (ref 3.5–5.1)
Sodium: 141 mEq/L (ref 135–145)

## 2011-10-20 NOTE — Assessment & Plan Note (Signed)
Stable. No change in aggressive secondary preventive strategies.

## 2011-10-20 NOTE — Progress Notes (Signed)
HPI [name] comes in today for followup of her chronic systolic congestive heart failure. She is nonobstructive coronary disease, chronic left bundle branch block, primary cardiomyopathy, hypertension and hyperlipidemia. She also has a biventricular pacer.  She continues to do remarkably well. Her arthritis slows her down the most. There she denies orthopnea, PND or edema. She's had no chest pain or angina. She denies palpitations, presyncope or syncope.  She's not had any blood work to check her electrolytes in 6 months. It is not a recent digoxin level.  She does not weigh herself every day.  Past Medical History  Diagnosis Date  . Pacemaker     Biventricular, [employer]   . CAD in native artery   . LBBB (left bundle branch block)   . Chronic systolic heart failure   . Cardiomyopathy, primary   . Hypertension   . Hyperlipidemia   . Urticaria   . Urinary incontinence   . Osteoarthritis   . Spinal stenosis, lumbar     Current Outpatient Prescriptions  Medication Sig Dispense Refill  . acetaminophen (TYLENOL) 500 MG tablet Take 1,000 mg by mouth 3 (three) times daily.        Marland Kitchen aspirin 81 MG tablet Take 81 mg by mouth daily.        . Calcium Carbonate-Vitamin D (CALCIUM 500+D PO) Take 1 tablet by mouth.        . carvedilol (COREG) 12.5 MG tablet Take 1 tablet (12.5 mg total) by mouth 2 (two) times daily with a meal.  180 tablet  3  . Cholecalciferol (VITAMIN D3) 1000 UNITS tablet Take 1,000 Units by mouth daily.        . digoxin (LANOXIN) 0.125 MG tablet Take 1 tablet (125 mcg total) by mouth daily.  90 tablet  3  . enalapril (VASOTEC) 20 MG tablet Take 20 mg by mouth 2 (two) times daily.        Marland Kitchen esomeprazole (NEXIUM) 20 MG capsule Take 20 mg by mouth daily.        Marland Kitchen ezetimibe-simvastatin (VYTORIN) 10-40 MG per tablet Take 1 tablet by mouth at bedtime.  90 tablet  3  . fish oil-omega-3 fatty acids 1000 MG capsule Take 2 capsules by mouth daily.        . furosemide  (LASIX) 40 MG tablet Take 80 mg by mouth daily.        . MULTIPLE VITAMIN PO Take 1 tablet by mouth daily.        . potassium chloride SA (K-DUR,KLOR-CON) 20 MEQ tablet Take 20 mEq by mouth daily.          Allergies  Allergen Reactions  . Codeine   . Morphine   . Propoxyphene N-Acetaminophen     REACTION: used to work, lately she would get sick    No family history on file.  History   Social History  . Marital Status: Married    Spouse Name: N/A    Number of Children: 5  . Years of Education: N/A   Occupational History  . retired     Horticulturist, commercial   Social History Main Topics  . Smoking status: Former Smoker    Quit date: 12/05/1955  . Smokeless tobacco: Never Used  . Alcohol Use: No  . Drug Use: Not on file  . Sexually Active: Not on file   Other Topics Concern  . Not on file   Social History Narrative   Has living will.  Discussed DNR-  she requests this ("if the Lord decided to take me, just let me go"). Wouldn't want feeding tube if not cognitively aware. Asks that husband, than daughter Schotsi     ROS ALL NEGATIVE EXCEPT THOSE NOTED IN HPI  PE  General Appearance: well developed, well nourished in no acute distress HEENT: symmetrical face, PERRLA, good dentition  Neck: no JVD, thyromegaly, or adenopathy, trachea midline Chest: symmetric without deformity Cardiac: PMI Displaced infralaterally RRR, normal S1, S2, no gallop or murmur Lung: clear to ausculation and percussion Vascular: all pulses full without bruits  Abdominal: nondistended, nontender, good bowel sounds, no HSM, no bruits Extremities: no cyanosis, clubbing or edema, no sign of DVT, no varicosities  Skin: normal color, no rashes Neuro: alert and oriented x 3, non-focal Pysch: normal affect  EKG AV sequential pacing, with the magnet, rate of 100. Without the magnet normal sinus rhythm with ventricular pacing. BMET    Component Value Date/Time   NA 138 05/03/2011 1036   K 3.6  05/03/2011 1036   CL 99 05/03/2011 1036   CO2 30 05/03/2011 1036   GLUCOSE 87 05/03/2011 1036   BUN 19 05/03/2011 1036   CREATININE 0.9 05/03/2011 1036   CALCIUM 9.1 05/03/2011 1036   GFRNONAA >60 03/11/2010 1345   GFRAA  Value: >60        The eGFR has been calculated using the MDRD equation. This calculation has not been validated in all clinical situations. eGFR's persistently <60 mL/min signify possible Chronic Kidney Disease. 03/11/2010 1345    Lipid Panel     Component Value Date/Time   CHOL 147 12/16/2008 1254   TRIG 213* 12/16/2008 1254   HDL 32.0* 12/16/2008 1254   CHOLHDL 4.6 CALC 12/16/2008 1254   VLDL 43* 12/16/2008 1254    CBC    Component Value Date/Time   WBC 5.6 03/10/2010 1241   RBC 3.51* 03/10/2010 1241   HGB 11.9* 03/10/2010 1241   HCT 35.0* 03/10/2010 1241   PLT 132* 03/10/2010 1241   MCV 99.9 03/10/2010 1241   MCHC 34.0 03/10/2010 1241   RDW 14.3 03/10/2010 1241   LYMPHSABS 1.4 03/09/2010 2151   MONOABS 1.0 03/09/2010 2151   EOSABS 0.0 03/09/2010 2151   BASOSABS 0.0 03/09/2010 2151

## 2011-10-20 NOTE — Assessment & Plan Note (Signed)
Stable. I have asked her to weigh herself daily. We'll check electrolytes and digoxin level today as well as renal function.

## 2011-10-20 NOTE — Patient Instructions (Signed)
Your physician recommends that you have blood work done today.  BMP and Digoxin level  Your physician wants you to follow-up in: 6 months You will receive a reminder letter in the mail two months in advance. If you don't receive a letter, please call our office to schedule the follow-up appointment.

## 2011-10-31 ENCOUNTER — Ambulatory Visit: Payer: Medicare Other | Admitting: Cardiology

## 2012-01-20 IMAGING — CT CT CERVICAL SPINE W/O CM
3 of 14 series · 7 of 33 positions shown, 8 images · non-contrast
Comparison: None.
COMPARISON: None.

CLINICAL DATA: Neck pain and headaches.  Low back pain with left
hip pain greater than the right.

CT CERVICAL SPINE WITHOUT CONTRAST
TECHNIQUE: Multidetector CT imaging of the cervical spine was
performed without intravenous contrast.  Multiplanar CT image
reconstructions were also generated.
TECHNIQUE: Multidetector CT imaging of the lumbar spine was
performed without intravenous contrast administration.  Multiplanar
CT image reconstructions were also generated.

[Series 2: l spine bone · axial · 0.27mm/px · z∈[-406,-201]mm · 2 of 83 slices shown, 3 images]
[im 1/83  soft-tissue]
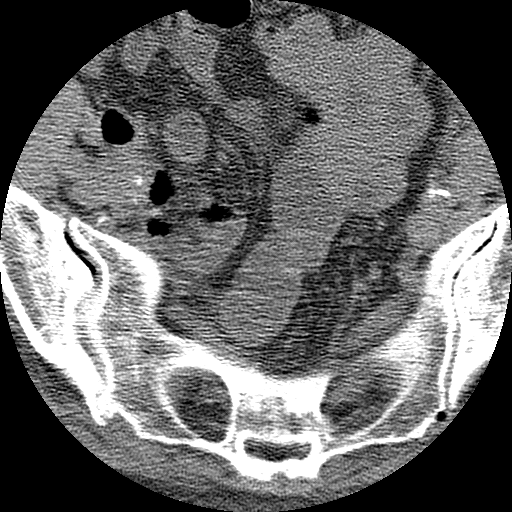
[im 1/83  bone]
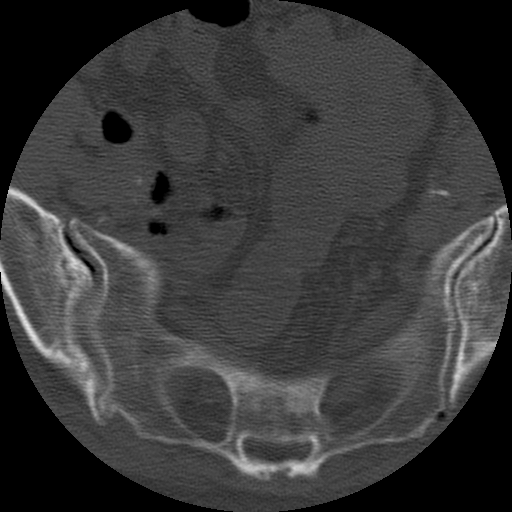
[im 83/83  bone]
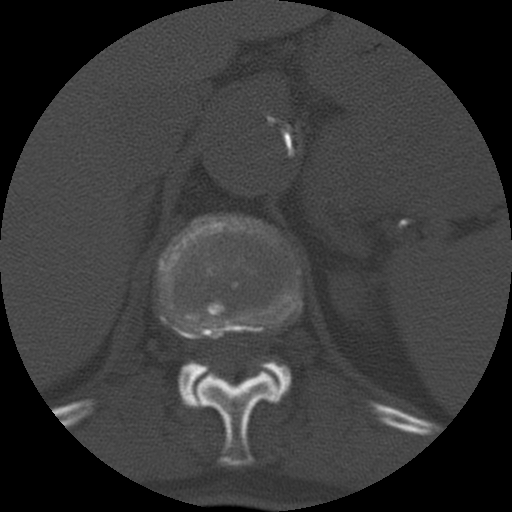

[Series 700: c-sp cor · coronal · 0.34mm/px · 1 of 44 slices shown]
[im 22/44  bone]
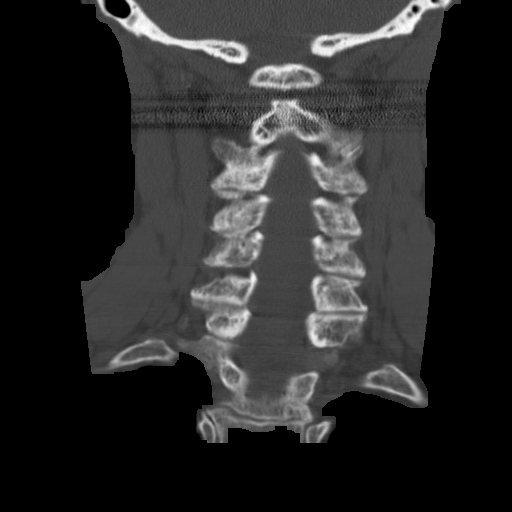

[Series 701: c-sp sag · sagittal · 0.34mm/px · 4 of 44 slices shown]
[im 9/44  bone]
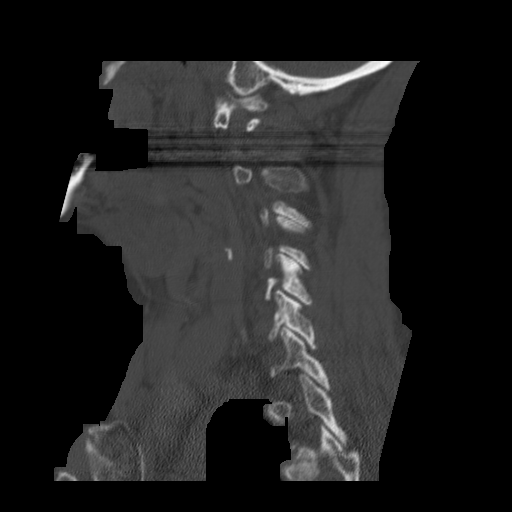
[im 18/44  bone]
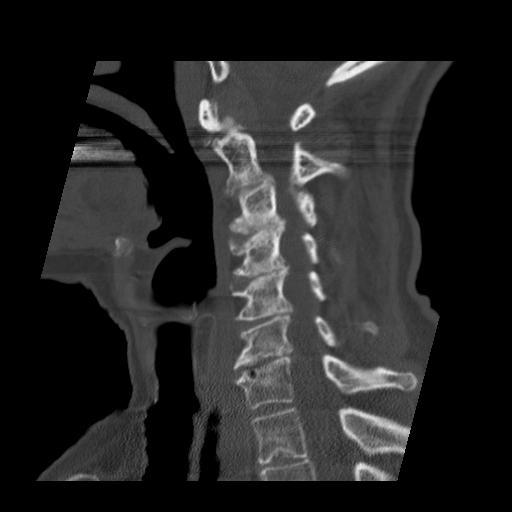
[im 26/44  bone]
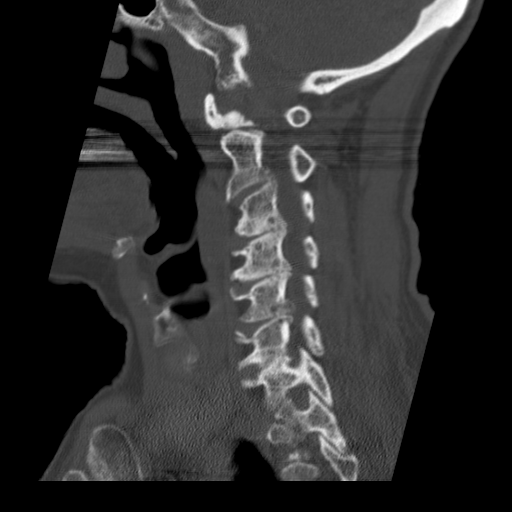
[im 35/44  bone]
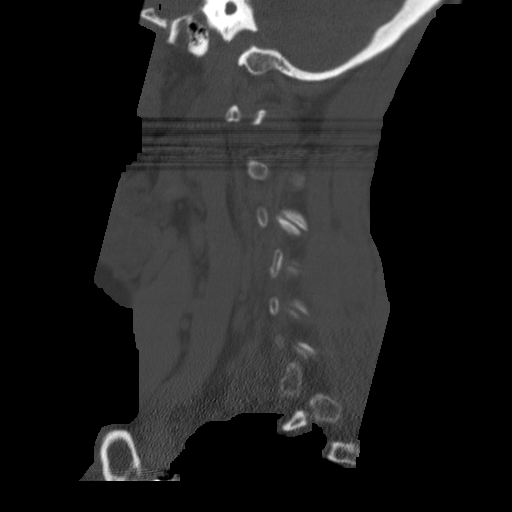

[7 of 33 positions shown; findings below may reference images not displayed]

FINDINGS: Heterogeneous appearance of the left lobe of the thyroid
gland with substernal extension.  Small calcifications.  This can
be followed with thyroid ultrasound.

Air-fluid level within the esophagus may indicate esophageal
dysmotility.

Carotid bifurcation calcifications.  The left internal carotid
artery is ectatic impressing upon the posterior aspect of the
pharynx.  Mild transverse ligament hypertrophy contributes to mild
spinal stenosis.

C2-3:  Fusion of the facet joints.  Anterior slip of C2 by 1.9 mm.
Small to moderate central protrusion slightly greater to the left
with moderate spinal stenosis and cord flattening.  Mild to
moderate bilateral foraminal narrowing.

C3-4:  Moderate broad-based disc protrusion.  Moderate spinal
stenosis and cord compression.  Uncinate hypertrophy and facet
joint degenerative changes with moderate bilateral foraminal
narrowing.

C4-5:  Small broad-based protrusion.  Mild to slightly moderate
spinal stenosis.  Mild cord flattening.  Uncinate hypertrophy and
facet joint degenerative change with moderate bilateral foraminal
narrowing.

C5-6:  Small to slightly moderate sized broad-based disc osteophyte
complex with slightly greater extension to the left.  Mild to
slightly moderate spinal stenosis with cord flattening greater on
the left.  Facet joint degenerative changes and uncinate
hypertrophy with mild to moderate right-sided and moderate left-
sided foraminal narrowing.

C6-7:  Small broad-based disc osteophyte complex.  Mild to slightly
moderate spinal stenosis.  Mild cord flattening.  Facet joint
degenerative changes and uncinate hypertrophy with moderate right-
sided and mild slightly moderate left-sided foraminal narrowing.

C7-T1:  Mild bulge.  Minimal facet joint degenerative changes.  No
significant spinal stenosis or foraminal narrowing.

T1-2:  Left-sided facet joint degenerative changes and uncinate
hypertrophy with moderate left-sided foraminal narrowing.
IMPRESSION: Multilevel cervical spondylotic changes with spinal stenosis and
foraminal narrowing as detailed above.  The most notable spinal
stenosis and cord compression is at the C2-3 and C3-4 level

CT LUMBAR SPINE WITHOUT CONTRAST
FINDINGS: Last fully open disc space is labeled L5-S1.  Present
examination incorporates from T12 through the lower sacrum.
Dextroscoliosis upper lumbar junction.

Atherosclerotic type changes of the aorta and iliac arteries.  No
aneurysm of the abdominal aorta detected.  Bilateral sacroiliac
joint degenerative changes.

T12-L1:  Disc space narrowing greater on the left.  Schmorl's node
deformity.  Moderate bulge/small broad-based protrusion and
rotation.  Facet joint degenerative changes ligamentum flavum
hypertrophy.  Mild spinal stenosis.  Right lateral extension of
disc touches the exiting right nerve root.

L1-2:  Prominent disc degeneration.  Endplate sclerosis with
osteophyte and disc space narrowing greater on the left.  Rotation
retrolisthesis L1.  Broad-based protrusion and spur.  Ligamentum
flavum hypertrophy and facet joint degenerative changes.
Multifactorial mild to moderate spinal stenosis.  Left greater than
right foraminal narrowing.  Lateral extension of disc causes mass
effect upon the exiting L1 nerve roots.

L2-3:  Moderate broad-based protrusion.  Facet joint degenerative
changes.  Prominent ligamentum flavum hypertrophy.  Multifactorial
moderate to marked spinal stenosis.  Lateral extension of disc
touches the exiting nerve roots.

L3-4:  Facet joint degenerative changes with anterior slip of L3 by
2.1 mm.  Broad-based disc protrusion.  Rotation.  Ligamentum flavum
hypertrophy.  Multifactorial marked spinal stenosis.  Lateral
extension of disc touches the exiting nerve roots.

L4-5:  Facet joint degenerative changes.  Bulge/broad-based
protrusion.  Ligamentum flavum hypertrophy.  Multifactorial
moderate to slightly marked spinal stenosis and lateral recess
stenosis.  Lateral extension of disc approaches but does not
compress the exiting nerve roots.

L5-S1:  Facet joint degenerative changes.  Bulge/small broad-based
protrusion.  Minimal impression upon ventral aspect of thecal sac
and S1 nerve roots.
IMPRESSION: Multilevel degenerative changes with spinal stenosis most notable
L3-4 level followed by the L2-3 and L4-5 level.  Please see above
for further detail.

## 2012-01-22 ENCOUNTER — Other Ambulatory Visit: Payer: Self-pay | Admitting: Cardiology

## 2012-01-23 ENCOUNTER — Ambulatory Visit: Payer: Medicare Other | Admitting: *Deleted

## 2012-01-23 ENCOUNTER — Encounter: Payer: Medicare Other | Admitting: *Deleted

## 2012-01-23 DIAGNOSIS — I428 Other cardiomyopathies: Secondary | ICD-10-CM

## 2012-01-23 LAB — PACEMAKER DEVICE OBSERVATION
AL AMPLITUDE: 1.1 mv
AL THRESHOLD: 0.2 V
DEVICE MODEL PM: 107959
LV LEAD THRESHOLD: 1.4 V
RV LEAD AMPLITUDE: 7.3 mv
RV LEAD THRESHOLD: 1.2 V

## 2012-01-25 NOTE — Progress Notes (Signed)
PPM check 

## 2012-01-30 ENCOUNTER — Ambulatory Visit (INDEPENDENT_AMBULATORY_CARE_PROVIDER_SITE_OTHER): Payer: Medicare Other | Admitting: Internal Medicine

## 2012-01-30 ENCOUNTER — Encounter: Payer: Self-pay | Admitting: Internal Medicine

## 2012-01-30 VITALS — BP 112/62 | HR 63 | Ht 62.0 in | Wt 135.0 lb

## 2012-01-30 DIAGNOSIS — I2510 Atherosclerotic heart disease of native coronary artery without angina pectoris: Secondary | ICD-10-CM

## 2012-01-30 DIAGNOSIS — I5022 Chronic systolic (congestive) heart failure: Secondary | ICD-10-CM

## 2012-01-30 DIAGNOSIS — I428 Other cardiomyopathies: Secondary | ICD-10-CM

## 2012-01-30 DIAGNOSIS — Z45018 Encounter for adjustment and management of other part of cardiac pacemaker: Secondary | ICD-10-CM

## 2012-01-30 DIAGNOSIS — I10 Essential (primary) hypertension: Secondary | ICD-10-CM

## 2012-01-30 DIAGNOSIS — I2589 Other forms of chronic ischemic heart disease: Secondary | ICD-10-CM

## 2012-01-30 LAB — PACEMAKER DEVICE OBSERVATION
BRDY-0002LV: 60 {beats}/min
LV LEAD IMPEDENCE PM: 1851 Ohm
LV LEAD THRESHOLD: 0.6 V
VENTRICULAR PACING PM: 97

## 2012-01-30 NOTE — Assessment & Plan Note (Signed)
Her heart failure symptoms are class 2-3. Hopefully with reprogramming of her device, her symptoms will improve. She is instructed to maintain a low-sodium diet and continue her current medical therapy.

## 2012-01-30 NOTE — Patient Instructions (Signed)
Your physician recommends that you schedule a follow-up appointment in: 3 MONTH TO SEE DR. Ladona Ridgel IN THE [facility] OFF[facility]  NO CHANGES TODAY

## 2012-01-30 NOTE — Progress Notes (Signed)
HPI [name] [name] returns today for followup. She is a very pleasant 78 year old woman with a history of chronic systolic heart failure, left bundle branch block, hypotension, and is status post by the pacemaker insertion in 2005. She underwent generator change in 2011. Routine interrogation carried out a week ago demonstrated that her left ventricular lead was not functioning. She returns today for additional evaluation. She notes the last week or 2, but she has been more short of breath. No syncope. No peripheral edema. Allergies  Allergen Reactions  . Codeine   . Morphine   . Propoxyphene N-Acetaminophen     REACTION: used to work, lately she would get sick     Current Outpatient Prescriptions  Medication Sig Dispense Refill  . acetaminophen (TYLENOL) 500 MG tablet Take 1,000 mg by mouth 3 (three) times daily.        Marland Kitchen aspirin 81 MG tablet Take 81 mg by mouth daily.        . Calcium Carbonate-Vitamin D (CALCIUM 500+D PO) Take 1 tablet by mouth.        . carvedilol (COREG) 12.5 MG tablet Take 1 tablet (12.5 mg total) by mouth 2 (two) times daily with a meal.  180 tablet  3  . Cholecalciferol (VITAMIN D3) 1000 UNITS tablet Take 1,000 Units by mouth daily.        . digoxin (LANOXIN) 0.125 MG tablet TAKE 1 TABLET BY MOUTH DAILY  90 tablet  2  . enalapril (VASOTEC) 20 MG tablet Take 10 mg by mouth 2 (two) times daily.       Marland Kitchen esomeprazole (NEXIUM) 20 MG capsule Take 20 mg by mouth daily.        Marland Kitchen ezetimibe-simvastatin (VYTORIN) 10-40 MG per tablet Take 1 tablet by mouth at bedtime.  90 tablet  3  . fish oil-omega-3 fatty acids 1000 MG capsule Take 2 capsules by mouth daily.        . furosemide (LASIX) 40 MG tablet Take 80 mg by mouth daily.       . MULTIPLE VITAMIN PO Take 1 tablet by mouth daily.        . potassium chloride SA (K-DUR,KLOR-CON) 20 MEQ tablet Take 20 mEq by mouth daily.           Past Medical History  Diagnosis Date  . Pacemaker     Biventricular, [employer]   .  CAD in native artery   . LBBB (left bundle branch block)   . Chronic systolic heart failure   . Cardiomyopathy, primary   . Hypertension   . Hyperlipidemia   . Urticaria   . Urinary incontinence   . Osteoarthritis   . Spinal stenosis, lumbar     ROS:   All systems reviewed and negative except as noted in the HPI.   Past Surgical History  Procedure Date  . Pacemaker insertion     Biventricular, [employer], ICD   . Amputation     distal left 3rd and 4th fingers as child   . Appendectomy   . Cholecystectomy   . Tonsillectomy   . Kidney surgery     right kidney procdeure, 1970's   . Hip fracture surgery 02/2004     right hip      History reviewed. No pertinent family history.   History   Social History  . Marital Status: Married    Spouse Name: N/A    Number of Children: 5  . Years of Education: N/A   Occupational History  .  retired     Horticulturist, commercial   Social History Main Topics  . Smoking status: Former Smoker    Quit date: 12/05/1955  . Smokeless tobacco: Never Used  . Alcohol Use: No  . Drug Use: No  . Sexually Active: Not on file   Other Topics Concern  . Not on file   Social History Narrative   Has living will.  Discussed DNR- she requests this ("if the Lord decided to take me, just let me go"). Wouldn't want feeding tube if not cognitively aware. Asks that husband, than daughter Schotsi      BP 112/62  Pulse 63  Ht 5\' 2"  (1.575 m)  Wt 61.236 kg (135 lb)  BMI 24.69 kg/m2  Physical Exam:  Well appearing elderly woman, NAD HEENT: Unremarkable Neck:  No JVD, no thyromegally Lymphatics:  No adenopathy Back:  No CVA tenderness Lungs:  Clear with no wheezes, rales, or rhonchi. HEART:  Regular rate rhythm, no murmurs, no rubs, no clicks Abd:  soft, positive bowel sounds, no organomegally, no rebound, no guarding Ext:  2 plus pulses, no edema, no cyanosis, no clubbing Skin:  No rashes no nodules Neuro:  CN II through XII  intact, motor grossly intact  EKG normal sinus rhythm with a QRS duration of 180 ms. Paced  DEVICE  Normal device function.  See PaceArt for details. The left ventricular lead was reprogrammed and is now pacing satisfactorily. The configuration is left ventricular tip the right ventricular ring.  Assess/Plan:

## 2012-01-30 NOTE — Assessment & Plan Note (Signed)
Her blood pressure is stable. She will continue her current medical therapy and maintain a low-sodium diet.

## 2012-01-30 NOTE — Assessment & Plan Note (Signed)
Today we reprogrammed her device. The left ventricular lead was not working and multiple configurations with no capture at maximum output and a pacing impedance of greater than 2500 ohms. By reprogrammed to left ventricular tip pacing right ventricular ring, the threshold was less than a full. We'll plan to see her back in 3 months. Hopefully this change in pacing will hold.

## 2012-02-20 ENCOUNTER — Ambulatory Visit (INDEPENDENT_AMBULATORY_CARE_PROVIDER_SITE_OTHER): Payer: Medicare Other | Admitting: *Deleted

## 2012-02-20 ENCOUNTER — Encounter: Payer: Self-pay | Admitting: Internal Medicine

## 2012-02-20 ENCOUNTER — Telehealth: Payer: Self-pay | Admitting: Internal Medicine

## 2012-02-20 DIAGNOSIS — I2589 Other forms of chronic ischemic heart disease: Secondary | ICD-10-CM

## 2012-02-20 DIAGNOSIS — I5022 Chronic systolic (congestive) heart failure: Secondary | ICD-10-CM

## 2012-02-20 LAB — PACEMAKER DEVICE OBSERVATION
LV LEAD IMPEDENCE PM: 486 Ohm
LV LEAD THRESHOLD: 0.6 V

## 2012-02-20 NOTE — Progress Notes (Signed)
PPM check 

## 2012-02-20 NOTE — Telephone Encounter (Signed)
Pt scheduled for office visit today at 3pm to evaluate leads.

## 2012-02-20 NOTE — Telephone Encounter (Signed)
Pt calling states that when she was here for her last visit with Ladona Ridgel that she was told that she had a wire loose. She said that she had something "sticking" her last night and she dont feel well today. I sent this to [facility] and [place]

## 2012-02-20 NOTE — Patient Instructions (Signed)
Take Laxix 80mg  in the am and 40mg  in the afternoon for 3 days then 80mg  every am.

## 2012-03-18 ENCOUNTER — Telehealth: Payer: Self-pay | Admitting: Cardiology

## 2012-03-18 NOTE — Telephone Encounter (Signed)
Spoke with pt dtr, dr deterding, the pts bp this am sitting was 118/60 and then standing 70/50. They are holding her lasix, she has no edema, lungs are clear and weight is stable at 133.5. They are going to cut the coreg in 1/2, the vasotec is already at 10 mg bid, they are giving her the digoxin at night. They are going to hold the lasix and monitor her weight, if there is an increase they will restart lasix at 20 mg once daily. The dtr reports during all of this the pts heart rate does not change. The pacer is set at 60 and the highest the heart rate has been is 66. They would like to see dr wall on his return 03-25-12. Follow up appt made. Will make dr wall aware.

## 2012-03-18 NOTE — Telephone Encounter (Signed)
New problem:  Per Dr. Lanora Manis Deterding- patient daughter calling.  Aware that Dr. Daleen Squibb is off this week. C/O Patient has orthostatic  blood pressure standing 70. Lying 120.  feeling nausea. Hold lasix for couple of days.

## 2012-03-25 ENCOUNTER — Ambulatory Visit (INDEPENDENT_AMBULATORY_CARE_PROVIDER_SITE_OTHER): Payer: Medicare Other | Admitting: Cardiology

## 2012-03-25 ENCOUNTER — Ambulatory Visit (INDEPENDENT_AMBULATORY_CARE_PROVIDER_SITE_OTHER): Payer: Medicare Other | Admitting: *Deleted

## 2012-03-25 ENCOUNTER — Encounter: Payer: Self-pay | Admitting: Cardiology

## 2012-03-25 ENCOUNTER — Encounter: Payer: Self-pay | Admitting: Internal Medicine

## 2012-03-25 VITALS — BP 118/74 | HR 72 | Ht 62.0 in | Wt 137.4 lb

## 2012-03-25 DIAGNOSIS — I447 Left bundle-branch block, unspecified: Secondary | ICD-10-CM

## 2012-03-25 DIAGNOSIS — I10 Essential (primary) hypertension: Secondary | ICD-10-CM

## 2012-03-25 DIAGNOSIS — Z45018 Encounter for adjustment and management of other part of cardiac pacemaker: Secondary | ICD-10-CM

## 2012-03-25 DIAGNOSIS — I5022 Chronic systolic (congestive) heart failure: Secondary | ICD-10-CM

## 2012-03-25 DIAGNOSIS — I2510 Atherosclerotic heart disease of native coronary artery without angina pectoris: Secondary | ICD-10-CM

## 2012-03-25 DIAGNOSIS — E785 Hyperlipidemia, unspecified: Secondary | ICD-10-CM

## 2012-03-25 DIAGNOSIS — I951 Orthostatic hypotension: Secondary | ICD-10-CM

## 2012-03-25 DIAGNOSIS — I428 Other cardiomyopathies: Secondary | ICD-10-CM

## 2012-03-25 LAB — PACEMAKER DEVICE OBSERVATION
AL IMPEDENCE PM: 446 Ohm
ATRIAL PACING PM: 18
RV LEAD IMPEDENCE PM: 630 Ohm
VENTRICULAR PACING PM: 82

## 2012-03-25 MED ORDER — CARVEDILOL 6.25 MG PO TABS: 6.2500 mg | ORAL_TABLET | Freq: Two times a day (BID) | ORAL | Status: DC

## 2012-03-25 MED ORDER — ENALAPRIL MALEATE 5 MG PO TABS: ORAL_TABLET | ORAL | Status: DC

## 2012-03-25 NOTE — Assessment & Plan Note (Signed)
She is no longer orthostatic today but her weight is up about 5 pounds. We'll continue with the Lasix for now each morning. I have decreased her enalapril to 5 mg by mouth twice a day. Her carvedilol was started and decreased by her family. Will check electrolytes today as well. She will continue on a daily basis with a target weight of around 135-137 pounds.

## 2012-03-25 NOTE — Progress Notes (Signed)
Seen as add-on for Dr Daleen Squibb.  Normal device function.  No changes made today.

## 2012-03-25 NOTE — Patient Instructions (Addendum)
Your physician recommends that you have lab work today.  BMP,digoxin level.  We will call you with your lab result.s  Your physician has recommended you make the following change in your medication: Decrease Enalapril to 5 mg twice a day  Your physician recommends that you schedule a follow-up appointment in: 3 months with Dr. Daleen Squibb.

## 2012-03-25 NOTE — Progress Notes (Signed)
HPI [name] [name] comes in today because of problems with orthostatic hypotension. Her family called the office last week on the 15th. They held her Lasix for a few days her blood pressure increased but her weight also increased about 6 pounds 7 pounds. She started to develop some edema. She is now back on the Lasix but takes it later in the day. Her weight is now about 140 but usually runs around 135.  She is less dizzy with standing. They cut her Coreg in half and started giving her digoxin at night.  There was one point where they felt her heart rate was low. She has a biventricular pacer.  She had nausea last week but she was so weak and dizzy but none since. She denies any vomiting or diarrhea. She is on low-dose digoxin.    Past Medical History  Diagnosis Date  . Pacemaker     Biventricular, [employer]   . CAD in native artery   . LBBB (left bundle branch block)   . Chronic systolic heart failure   . Cardiomyopathy, primary   . Hypertension   . Hyperlipidemia   . Urticaria   . Urinary incontinence   . Osteoarthritis   . Spinal stenosis, lumbar     Current Outpatient Prescriptions  Medication Sig Dispense Refill  . acetaminophen (TYLENOL) 500 MG tablet Take 1,000 mg by mouth 3 (three) times daily.        Marland Kitchen aspirin 81 MG tablet Take 81 mg by mouth daily.        . Calcium Carbonate-Vitamin D (CALCIUM 500+D PO) Take 1 tablet by mouth.        . carvedilol (COREG) 12.5 MG tablet Take 6.25 mg by mouth 2 (two) times daily with a meal.      . Cholecalciferol (VITAMIN D3) 1000 UNITS tablet Take 1,000 Units by mouth daily.        . digoxin (LANOXIN) 0.125 MG tablet TAKE 1 TABLET BY MOUTH DAILY  90 tablet  2  . enalapril (VASOTEC) 20 MG tablet Take 10 mg by mouth 2 (two) times daily.       Marland Kitchen esomeprazole (NEXIUM) 20 MG capsule Take 20 mg by mouth daily.        Marland Kitchen ezetimibe-simvastatin (VYTORIN) 10-40 MG per tablet Take 1 tablet by mouth at bedtime.  90 tablet  3  . fish  oil-omega-3 fatty acids 1000 MG capsule Take 2 capsules by mouth daily.        . furosemide (LASIX) 40 MG tablet Take 40 mg by mouth daily. 1 extra tablet for weight gain      . MULTIPLE VITAMIN PO Take 1 tablet by mouth daily.        . potassium chloride SA (K-DUR,KLOR-CON) 20 MEQ tablet Take 20 mEq by mouth daily.        Marland Kitchen DISCONTD: carvedilol (COREG) 12.5 MG tablet Take 1 tablet (12.5 mg total) by mouth 2 (two) times daily with a meal.  180 tablet  3    Allergies  Allergen Reactions  . Codeine   . Morphine   . Propoxacet-N     REACTION: used to work, lately she would get sick    No family history on file.  History   Social History  . Marital Status: Married    Spouse Name: N/A    Number of Children: 5  . Years of Education: N/A   Occupational History  . retired     Horticulturist, commercial  Social History Main Topics  . Smoking status: Former Smoker    Quit date: 12/05/1955  . Smokeless tobacco: Never Used  . Alcohol Use: No  . Drug Use: No  . Sexually Active: Not on file   Other Topics Concern  . Not on file   Social History Narrative   Has living will.  Discussed DNR- she requests this ("if the Lord decided to take me, just let me go"). Wouldn't want feeding tube if not cognitively aware. Asks that husband, than daughter Schotsi     ROS ALL NEGATIVE EXCEPT THOSE NOTED IN HPI  PE  General Appearance: well developed, well nourished in no acute distress HEENT: symmetrical face, PERRLA, good dentition  Neck: no JVD, thyromegaly, or adenopathy, trachea midline Chest: symmetric without deformity Cardiac: PMI non-displaced,ir regular rate and rhythm, normal S1, S2, no gallop or murmur Lung: clear to ausculation and percussion Vascular: all pulses full without bruits  Abdominal: nondistended, nontender, good bowel sounds, no HSM, no bruits Extremities: no cyanosis, clubbing 1+ edema, no sign of DVT, no varicosities  Skin: normal color, no rashes Neuro: alert  and oriented x 3, non-focal Pysch: normal affect  EKG  BMET    Component Value Date/Time   NA 141 10/20/2011 1004   K 4.0 10/20/2011 1004   CL 103 10/20/2011 1004   CO2 30 10/20/2011 1004   GLUCOSE 103* 10/20/2011 1004   BUN 22 10/20/2011 1004   CREATININE 0.8 10/20/2011 1004   CALCIUM 9.1 10/20/2011 1004   GFRNONAA >60 03/11/2010 1345   GFRAA  Value: >60        The eGFR has been calculated using the MDRD equation. This calculation has not been validated in all clinical situations. eGFR's persistently <60 mL/min signify possible Chronic Kidney Disease. 03/11/2010 1345    Lipid Panel     Component Value Date/Time   CHOL 147 12/16/2008 1254   TRIG 213* 12/16/2008 1254   HDL 32.0* 12/16/2008 1254   CHOLHDL 4.6 CALC 12/16/2008 1254   VLDL 43* 12/16/2008 1254    CBC    Component Value Date/Time   WBC 5.6 03/10/2010 1241   RBC 3.51* 03/10/2010 1241   HGB 11.9* 03/10/2010 1241   HCT 35.0* 03/10/2010 1241   PLT 132* 03/10/2010 1241   MCV 99.9 03/10/2010 1241   MCHC 34.0 03/10/2010 1241   RDW 14.3 03/10/2010 1241   LYMPHSABS 1.4 03/09/2010 2151   MONOABS 1.0 03/09/2010 2151   EOSABS 0.0 03/09/2010 2151   BASOSABS 0.0 03/09/2010 2151

## 2012-03-25 NOTE — Assessment & Plan Note (Signed)
Device checked in the office and is working properly. No changes made.

## 2012-03-26 LAB — BASIC METABOLIC PANEL
CO2: 30 mEq/L (ref 19–32)
Calcium: 9.6 mg/dL (ref 8.4–10.5)
Creatinine, Ser: 0.6 mg/dL (ref 0.4–1.2)
Glucose, Bld: 120 mg/dL — ABNORMAL HIGH (ref 70–99)

## 2012-04-17 ENCOUNTER — Ambulatory Visit: Payer: Medicare Other | Admitting: Cardiology

## 2012-04-30 ENCOUNTER — Other Ambulatory Visit: Payer: Self-pay | Admitting: Cardiology

## 2012-04-30 ENCOUNTER — Encounter: Payer: Medicare Other | Admitting: Internal Medicine

## 2012-05-11 ENCOUNTER — Other Ambulatory Visit: Payer: Self-pay | Admitting: Cardiology

## 2012-05-21 ENCOUNTER — Ambulatory Visit (INDEPENDENT_AMBULATORY_CARE_PROVIDER_SITE_OTHER): Payer: Medicare Other | Admitting: Internal Medicine

## 2012-05-21 ENCOUNTER — Encounter: Payer: Self-pay | Admitting: Internal Medicine

## 2012-05-21 DIAGNOSIS — I428 Other cardiomyopathies: Secondary | ICD-10-CM

## 2012-05-21 DIAGNOSIS — I5022 Chronic systolic (congestive) heart failure: Secondary | ICD-10-CM

## 2012-05-21 DIAGNOSIS — Z45018 Encounter for adjustment and management of other part of cardiac pacemaker: Secondary | ICD-10-CM

## 2012-05-21 LAB — PACEMAKER DEVICE OBSERVATION
AL AMPLITUDE: 1.6 mv
AL THRESHOLD: 0.4 V
DEVICE MODEL PM: 107959
LV LEAD IMPEDENCE PM: 535 Ohm
RV LEAD THRESHOLD: 1 V

## 2012-05-21 NOTE — Progress Notes (Signed)
HPI [name] [name] returns today for followup. She is a very pleasant 78 year old woman with chronic systolic heart failure, left bundle branch block, status post biventricular pacemaker insertion. She has had improvement in her left ventricular function and her EF is now 50%. She has class II congestive heart failure. She admits to some sodium indiscretion. She denies syncope, chest pain, fevers, or chills. Allergies  Allergen Reactions  . Codeine   . Morphine   . Propoxyphene-Acetaminophen     REACTION: used to work, lately she would get sick     Current Outpatient Prescriptions  Medication Sig Dispense Refill  . acetaminophen (TYLENOL) 500 MG tablet Take 1,000 mg by mouth 3 (three) times daily.        Marland Kitchen aspirin 81 MG tablet Take 81 mg by mouth daily.        . Calcium Carbonate-Vitamin D (CALCIUM 500+D PO) Take 1 tablet by mouth.        . carvedilol (COREG) 12.5 MG tablet Take 12.5 mg by mouth 2 (two) times daily with a meal.      . Cholecalciferol (VITAMIN D3) 1000 UNITS tablet Take 1,000 Units by mouth daily.        . enalapril (VASOTEC) 10 MG tablet Take 10 mg by mouth 2 (two) times daily.      Marland Kitchen esomeprazole (NEXIUM) 20 MG capsule Take 20 mg by mouth daily.        Marland Kitchen ezetimibe-simvastatin (VYTORIN) 10-40 MG per tablet Take 1 tablet by mouth at bedtime.  90 tablet  3  . fish oil-omega-3 fatty acids 1000 MG capsule Take 2 capsules by mouth daily.        . furosemide (LASIX) 40 MG tablet Take 40 mg by mouth 2 (two) times daily.       Marland Kitchen LANOXIN 0.125 MG tablet TAKE 1 TABLET BY MOUTH DAILY  90 tablet  1  . latanoprost (XALATAN) 0.005 % ophthalmic solution 1 drop at bedtime.      . MULTIPLE VITAMIN PO Take 1 tablet by mouth daily.        . potassium chloride SA (K-DUR,KLOR-CON) 20 MEQ tablet Take 20 mEq by mouth daily.        Marland Kitchen DISCONTD: furosemide (LASIX) 40 MG tablet Take 1 tablet (40 mg total) by mouth daily.  90 tablet  1     Past Medical History  Diagnosis Date  . Pacemaker    Biventricular, [employer]   . CAD in native artery   . LBBB (left bundle branch block)   . Chronic systolic heart failure   . Cardiomyopathy, primary   . Hypertension   . Hyperlipidemia   . Urticaria   . Urinary incontinence   . Osteoarthritis   . Spinal stenosis, lumbar     ROS:   All systems reviewed and negative except as noted in the HPI.   Past Surgical History  Procedure Date  . Pacemaker insertion     Biventricular, [employer], ICD   . Amputation     distal left 3rd and 4th fingers as child   . Appendectomy   . Cholecystectomy   . Tonsillectomy   . Kidney surgery     right kidney procdeure, 1970's   . Hip fracture surgery 02/2004     right hip      History reviewed. No pertinent family history.   History   Social History  . Marital Status: Married    Spouse Name: N/A    Number of Children:  5  . Years of Education: N/A   Occupational History  . retired     Horticulturist, commercial   Social History Main Topics  . Smoking status: Former Smoker    Quit date: 12/05/1955  . Smokeless tobacco: Never Used  . Alcohol Use: No  . Drug Use: No  . Sexually Active: Not on file   Other Topics Concern  . Not on file   Social History Narrative   Has living will.  Discussed DNR- she requests this ("if the Lord decided to take me, just let me go"). Wouldn't want feeding tube if not cognitively aware. Asks that husband, than daughter Schotsi      BP 115/67  Pulse 73  Ht 5\' 2"  (1.575 m)  Wt 132 lb (59.875 kg)  BMI 24.14 kg/m2  Physical Exam:  Well appearing elderly woman, NAD HEENT: Unremarkable Neck:  7 cm JVD, no thyromegally Lungs:  Clear with no wheezes, rales, or rhonchi. Well-healed pacemaker incision. HEART:  Regular rate rhythm, no murmurs, no rubs, no clicks Abd:  soft, positive bowel sounds, no organomegally, no rebound, no guarding Ext:  2 plus pulses, no edema, no cyanosis, no clubbing Skin:  No rashes no nodules Neuro:  CN  II through XII intact, motor grossly intact   DEVICE  Normal device function.  See PaceArt for details.   Assess/Plan:

## 2012-05-21 NOTE — Assessment & Plan Note (Signed)
Her symptoms are class II. I've encouraged the patient to reduce her sodium intake. No change in medical therapy today.

## 2012-05-21 NOTE — Assessment & Plan Note (Signed)
Her device is working normally. We'll plan to recheck in several months. 

## 2012-05-21 NOTE — Patient Instructions (Addendum)
Your physician wants you to follow-up in: 1 year with Dr. Taylor. You will receive a reminder letter in the mail two months in advance. If you don't receive a letter, please call our office to schedule the follow-up appointment.  

## 2012-06-18 ENCOUNTER — Ambulatory Visit (INDEPENDENT_AMBULATORY_CARE_PROVIDER_SITE_OTHER): Payer: Medicare Other | Admitting: Cardiology

## 2012-06-18 ENCOUNTER — Encounter: Payer: Self-pay | Admitting: Cardiology

## 2012-06-18 VITALS — BP 110/66 | HR 67 | Ht 62.0 in | Wt 133.0 lb

## 2012-06-18 DIAGNOSIS — I10 Essential (primary) hypertension: Secondary | ICD-10-CM

## 2012-06-18 DIAGNOSIS — I428 Other cardiomyopathies: Secondary | ICD-10-CM

## 2012-06-18 DIAGNOSIS — I5022 Chronic systolic (congestive) heart failure: Secondary | ICD-10-CM

## 2012-06-18 DIAGNOSIS — E785 Hyperlipidemia, unspecified: Secondary | ICD-10-CM

## 2012-06-18 DIAGNOSIS — I2510 Atherosclerotic heart disease of native coronary artery without angina pectoris: Secondary | ICD-10-CM

## 2012-06-18 DIAGNOSIS — I447 Left bundle-branch block, unspecified: Secondary | ICD-10-CM

## 2012-06-18 NOTE — Assessment & Plan Note (Signed)
Stable. No changes in medical therapy today. Return the office in 6 months.

## 2012-06-18 NOTE — Patient Instructions (Addendum)
Your physician wants you to follow-up in: 6 months with Dr. Wall.  You will receive a reminder letter in the mail two months in advance. If you don't receive a letter, please call our office to schedule the follow-up appointment.  

## 2012-06-18 NOTE — Progress Notes (Signed)
HPI [name] [name] comes in today for evaluation and management of her chronic systolic heart failure. On last visit, she was orthostatic. At that time I decreased her enalapril. Today her med list shows that his back up to 10 mg twice a day.  She is no longer dizzy with standing. As far as I know her Lasix has not been changed.  Her weight apparently has been stable. She was 137 on last visit. Today she is 133. Para she denies any chest pain, orthopnea, PND or edema.  Past Medical History  Diagnosis Date  . Pacemaker     Biventricular, [employer]   . CAD in native artery   . LBBB (left bundle branch block)   . Chronic systolic heart failure   . Cardiomyopathy, primary   . Hypertension   . Hyperlipidemia   . Urticaria   . Urinary incontinence   . Osteoarthritis   . Spinal stenosis, lumbar     Current Outpatient Prescriptions  Medication Sig Dispense Refill  . acetaminophen (TYLENOL) 500 MG tablet Take 1,000 mg by mouth 3 (three) times daily.        Marland Kitchen aspirin 81 MG tablet Take 81 mg by mouth daily.        . Calcium Carbonate-Vitamin D (CALCIUM 500+D PO) Take 1 tablet by mouth.        . carvedilol (COREG) 12.5 MG tablet Take 12.5 mg by mouth 2 (two) times daily with a meal.      . Cholecalciferol (VITAMIN D3) 1000 UNITS tablet Take 1,000 Units by mouth daily.        . enalapril (VASOTEC) 10 MG tablet Take 10 mg by mouth 2 (two) times daily.      Marland Kitchen esomeprazole (NEXIUM) 20 MG capsule Take 20 mg by mouth daily.        Marland Kitchen ezetimibe-simvastatin (VYTORIN) 10-40 MG per tablet Take 1 tablet by mouth at bedtime.  90 tablet  3  . fish oil-omega-3 fatty acids 1000 MG capsule Take 2 capsules by mouth daily.        . furosemide (LASIX) 40 MG tablet Take 40 mg by mouth 2 (two) times daily.       Marland Kitchen LANOXIN 0.125 MG tablet TAKE 1 TABLET BY MOUTH DAILY  90 tablet  1  . latanoprost (XALATAN) 0.005 % ophthalmic solution 1 drop at bedtime.      . MULTIPLE VITAMIN PO Take 1 tablet by mouth daily.         . potassium chloride SA (K-DUR,KLOR-CON) 20 MEQ tablet Take 20 mEq by mouth daily.          Allergies  Allergen Reactions  . Codeine   . Morphine   . Propoxyphene-Acetaminophen     REACTION: used to work, lately she would get sick    No family history on file.  History   Social History  . Marital Status: Married    Spouse Name: N/A    Number of Children: 5  . Years of Education: N/A   Occupational History  . retired     Horticulturist, commercial   Social History Main Topics  . Smoking status: Former Smoker    Quit date: 12/05/1955  . Smokeless tobacco: Never Used  . Alcohol Use: No  . Drug Use: No  . Sexually Active: Not on file   Other Topics Concern  . Not on file   Social History Narrative   Has living will.  Discussed DNR- she requests this ("if  the Lord decided to take me, just let me go"). Wouldn't want feeding tube if not cognitively aware. Asks that husband, than daughter Schotsi     ROS ALL NEGATIVE EXCEPT THOSE NOTED IN HPI  PE  General Appearance: well developed, well nourished in no acute distress HEENT: symmetrical face, PERRLA, good dentition  Neck: no JVD, thyromegaly, or adenopathy, trachea midline Chest: symmetric without deformity Cardiac: PMI non-displaced, RRR, normal S1, paradoxical S2 no gallop or murmur Lung: clear to ausculation and percussion Vascular: all pulses full without bruits  Abdominal: nondistended, nontender, good bowel sounds, no HSM, no bruits Extremities: no cyanosis, clubbing or edema, no sign of DVT, no varicosities  Skin: normal color, no rashes Neuro: alert and oriented x 3, non-focal Pysch: normal affect  EKG  BMET    Component Value Date/Time   NA 140 03/25/2012 1511   K 4.3 03/25/2012 1511   CL 103 03/25/2012 1511   CO2 30 03/25/2012 1511   GLUCOSE 120* 03/25/2012 1511   BUN 20 03/25/2012 1511   CREATININE 0.6 03/25/2012 1511   CALCIUM 9.6 03/25/2012 1511   GFRNONAA >60 03/11/2010 1345   GFRAA  Value: >60         The eGFR has been calculated using the MDRD equation. This calculation has not been validated in all clinical situations. eGFR's persistently <60 mL/min signify possible Chronic Kidney Disease. 03/11/2010 1345    Lipid Panel     Component Value Date/Time   CHOL 147 12/16/2008 1254   TRIG 213* 12/16/2008 1254   HDL 32.0* 12/16/2008 1254   CHOLHDL 4.6 CALC 12/16/2008 1254   VLDL 43* 12/16/2008 1254    CBC    Component Value Date/Time   WBC 5.6 03/10/2010 1241   RBC 3.51* 03/10/2010 1241   HGB 11.9* 03/10/2010 1241   HCT 35.0* 03/10/2010 1241   PLT 132* 03/10/2010 1241   MCV 99.9 03/10/2010 1241   MCHC 34.0 03/10/2010 1241   RDW 14.3 03/10/2010 1241   LYMPHSABS 1.4 03/09/2010 2151   MONOABS 1.0 03/09/2010 2151   EOSABS 0.0 03/09/2010 2151   BASOSABS 0.0 03/09/2010 2151

## 2012-10-07 ENCOUNTER — Other Ambulatory Visit: Payer: Self-pay | Admitting: Cardiology

## 2012-10-09 ENCOUNTER — Other Ambulatory Visit: Payer: Self-pay | Admitting: *Deleted

## 2012-10-09 MED ORDER — FUROSEMIDE 40 MG PO TABS: 40.0000 mg | ORAL_TABLET | Freq: Two times a day (BID) | ORAL | Status: DC

## 2012-11-25 ENCOUNTER — Encounter: Payer: Self-pay | Admitting: Internal Medicine

## 2012-11-25 ENCOUNTER — Ambulatory Visit (INDEPENDENT_AMBULATORY_CARE_PROVIDER_SITE_OTHER): Payer: Medicare Other | Admitting: *Deleted

## 2012-11-25 ENCOUNTER — Other Ambulatory Visit: Payer: Self-pay

## 2012-11-25 DIAGNOSIS — I428 Other cardiomyopathies: Secondary | ICD-10-CM

## 2012-11-25 DIAGNOSIS — I5022 Chronic systolic (congestive) heart failure: Secondary | ICD-10-CM

## 2012-11-25 DIAGNOSIS — Z45018 Encounter for adjustment and management of other part of cardiac pacemaker: Secondary | ICD-10-CM

## 2012-11-25 LAB — PACEMAKER DEVICE OBSERVATION
ATRIAL PACING PM: 3
LV LEAD IMPEDENCE PM: 2500 Ohm
RV LEAD IMPEDENCE PM: 598 Ohm
RV LEAD THRESHOLD: 1.2 V
VENTRICULAR PACING PM: 96

## 2012-11-25 NOTE — Progress Notes (Signed)
PPM check 

## 2012-12-06 ENCOUNTER — Other Ambulatory Visit: Payer: Self-pay | Admitting: Emergency Medicine

## 2012-12-06 MED ORDER — DIGOXIN 125 MCG PO TABS: 0.1250 mg | ORAL_TABLET | Freq: Every day | ORAL | Status: DC

## 2012-12-30 ENCOUNTER — Encounter: Payer: Self-pay | Admitting: Internal Medicine

## 2013-01-07 ENCOUNTER — Encounter: Payer: Medicare Other | Admitting: Internal Medicine

## 2013-01-28 ENCOUNTER — Encounter: Payer: Self-pay | Admitting: Internal Medicine

## 2013-01-28 ENCOUNTER — Ambulatory Visit (INDEPENDENT_AMBULATORY_CARE_PROVIDER_SITE_OTHER): Payer: Medicare Other | Admitting: Internal Medicine

## 2013-01-28 VITALS — BP 130/63 | HR 70 | Ht 62.0 in | Wt 137.8 lb

## 2013-01-28 DIAGNOSIS — I5022 Chronic systolic (congestive) heart failure: Secondary | ICD-10-CM

## 2013-01-28 DIAGNOSIS — I428 Other cardiomyopathies: Secondary | ICD-10-CM

## 2013-01-28 LAB — PACEMAKER DEVICE OBSERVATION
AL THRESHOLD: 1 V
ATRIAL PACING PM: 1
DEVICE MODEL PM: 107959
RV LEAD THRESHOLD: 1.2 V

## 2013-01-28 NOTE — Patient Instructions (Signed)
Your physician recommends that you schedule a follow-up appointment in: [facility] 3/11

## 2013-01-30 DIAGNOSIS — T82111A Breakdown (mechanical) of cardiac pulse generator (battery), initial encounter: Secondary | ICD-10-CM | POA: Insufficient documentation

## 2013-01-30 NOTE — Progress Notes (Signed)
HPI [name] returns today for followup. She is a very pleasant 79 year old woman with complete heart block and chronic systolic heart failure, status post biventricular pacemaker insertion. The patient has had class III heart failure symptoms. Over the past several months, she has been noted to have worsening left ventricular pacing performance and most recently was found to have non-capture at high outputs with a high left ventricular pacing impedance. It appears that her left ventricular lead has broken. A chest x-ray was obtained at another center, but we do not have this available to review. She has not been hospitalized, and she has minimal peripheral edema. No syncope Allergies  Allergen Reactions  . Codeine   . Morphine   . Propoxyphene-Acetaminophen     REACTION: used to work, lately she would get sick     Current Outpatient Prescriptions  Medication Sig Dispense Refill  . acetaminophen (TYLENOL) 500 MG tablet Take 1,000 mg by mouth 3 (three) times daily.        Marland Kitchen aspirin 81 MG tablet Take 81 mg by mouth daily.        . Calcium Carbonate-Vitamin D (CALCIUM 500+D PO) Take 1 tablet by mouth.        . carvedilol (COREG) 12.5 MG tablet Take 12.5 mg by mouth 2 (two) times daily with a meal.      . Cholecalciferol (VITAMIN D3) 1000 UNITS tablet Take 1,000 Units by mouth daily.        . digoxin (LANOXIN) 0.125 MG tablet Take 1 tablet (0.125 mg total) by mouth daily.  90 tablet  1  . enalapril (VASOTEC) 10 MG tablet Take 10 mg by mouth 2 (two) times daily.      Marland Kitchen esomeprazole (NEXIUM) 20 MG capsule Take 20 mg by mouth daily.        Marland Kitchen ezetimibe-simvastatin (VYTORIN) 10-40 MG per tablet Take 1 tablet by mouth at bedtime.  90 tablet  3  . fish oil-omega-3 fatty acids 1000 MG capsule Take 2 capsules by mouth daily.        . furosemide (LASIX) 40 MG tablet Take 1 tablet (40 mg total) by mouth 2 (two) times daily.  180 tablet  1  . latanoprost (XALATAN) 0.005 % ophthalmic solution Place 1 drop  into both eyes at bedtime.       . MULTIPLE VITAMIN PO Take 1 tablet by mouth daily.        . potassium chloride SA (K-DUR,KLOR-CON) 20 MEQ tablet Take 20 mEq by mouth daily.         No current facility-administered medications for this visit.     Past Medical History  Diagnosis Date  . Pacemaker     Biventricular, [employer]   . CAD in native artery   . LBBB (left bundle branch block)   . Chronic systolic heart failure   . Cardiomyopathy, primary   . Hypertension   . Hyperlipidemia   . Urticaria   . Urinary incontinence   . Osteoarthritis   . Spinal stenosis, lumbar     ROS:   All systems reviewed and negative except as noted in the HPI.   Past Surgical History  Procedure Laterality Date  . Pacemaker insertion      Biventricular, [employer], ICD   . Amputation      distal left 3rd and 4th fingers as child   . Appendectomy    . Cholecystectomy    . Tonsillectomy    . Kidney surgery  right kidney procdeure, 1970's   . Hip fracture surgery  02/2004     right hip      No family history on file.   History   Social History  . Marital Status: Married    Spouse Name: N/A    Number of Children: 5  . Years of Education: N/A   Occupational History  . retired     Horticulturist, commercial   Social History Main Topics  . Smoking status: Former Smoker    Quit date: 12/05/1955  . Smokeless tobacco: Never Used  . Alcohol Use: No  . Drug Use: No  . Sexually Active: Not on file   Other Topics Concern  . Not on file   Social History Narrative   Has living will.  Discussed DNR- she requests this ("if the Lord decided to take me, just let me go"). Wouldn't want feeding tube if not cognitively aware. Asks that husband, than daughter Schotsi      BP 130/63  Pulse 70  Ht 5\' 2"  (1.575 m)  Wt 137 lb 12.8 oz (62.506 kg)  BMI 25.2 kg/m2  Physical Exam:  Well appearing elderly woman,NAD HEENT: Unremarkable Neck:  7 cm JVD, no  thyromegally Lungs:  Clear except for scattered basilar rales, no wheezes or rhonchi. HEART:  Regular rate rhythm, no murmurs, no rubs, no clicks Abd:  soft, positive bowel sounds, no organomegally, no rebound, no guarding Ext:  2 plus pulses, no edema, no cyanosis, no clubbing Skin:  No rashes no nodules Neuro:  CN II through XII intact, motor grossly intact  EKG - normal sinus rhythm with pacing induced left bundle branch block, QRS duration 186 ms  DEVICE  Left ventricular lead malfunction.  See PaceArt for details.   Assess/Plan:

## 2013-01-30 NOTE — Assessment & Plan Note (Signed)
The patient's left ventricular lead appears to have broken. I will try to better understand her mechanism of this by reviewing her chest x-ray. I'll plan to see her back in several months to discuss the treatment options.

## 2013-01-30 NOTE — Assessment & Plan Note (Signed)
The patient systolic heart failure is class III but is compensated. I suspect we could improve her heart failure symptoms if we could make her left ventricular lead work up or if we could place a new left ventricular lead. Unfortunately this would not be easy. I've recommended that she continue her current heart failure medications and maintain a low-sodium diet, and attempt to increase her physical activity. With her advanced age, I would be reluctant to undergo left ventricular lead revision although I would not rule out definitively.

## 2013-02-07 ENCOUNTER — Other Ambulatory Visit: Payer: Self-pay | Admitting: Cardiology

## 2013-02-11 ENCOUNTER — Encounter: Payer: Medicare Other | Admitting: Internal Medicine

## 2013-02-18 ENCOUNTER — Other Ambulatory Visit: Payer: Self-pay | Admitting: Cardiology

## 2013-02-19 ENCOUNTER — Encounter: Payer: Self-pay | Admitting: *Deleted

## 2013-03-13 ENCOUNTER — Encounter: Payer: Self-pay | Admitting: Internal Medicine

## 2013-03-13 ENCOUNTER — Ambulatory Visit (INDEPENDENT_AMBULATORY_CARE_PROVIDER_SITE_OTHER): Payer: Medicare Other | Admitting: Internal Medicine

## 2013-03-13 VITALS — BP 102/58 | HR 74 | Ht 62.0 in | Wt 134.8 lb

## 2013-03-13 DIAGNOSIS — I428 Other cardiomyopathies: Secondary | ICD-10-CM

## 2013-03-13 DIAGNOSIS — I5022 Chronic systolic (congestive) heart failure: Secondary | ICD-10-CM

## 2013-03-13 LAB — PACEMAKER DEVICE OBSERVATION
AL AMPLITUDE: 1.5 mv
AL THRESHOLD: 0.6 V
ATRIAL PACING PM: 2
DEVICE MODEL PM: 107959
RV LEAD AMPLITUDE: 9 mv
RV LEAD IMPEDENCE PM: 612 Ohm
RV LEAD THRESHOLD: 1.2 V

## 2013-03-13 NOTE — Patient Instructions (Addendum)
Your physician wants you to follow-up in: 6 months with device check and Dr. Ladona Ridgel. You will receive a reminder letter in the mail two months in advance. If you don't receive a letter, please call our office to schedule the follow-up appointment.

## 2013-03-13 NOTE — Progress Notes (Signed)
HPI [name] [name] returns today for followup. She is a very pleasant 79 year old woman with chronic systolic heart failure, left bundle branch block, who is status post biventricular pacemaker insertion.  Unfortunately, the patient's left ventricular pacing lead has quit working, with a very elevated pacing impedance,  Likely secondary to a fractured lead. The left ventricular lead has been turned off. I have reviewed her chest x-ray in detail. The patient has class III heart failure symptoms, but is well compensated. Her lifestyle is very sedentary due to her advanced age. She is not currently following or injured herself that we know of or that she admits to. She has occasional episodes of vague chest discomfort. She has not been hospitalized for congestive heart failure  In the last several months. Allergies  Allergen Reactions  . Codeine   . Morphine   . Propoxyphene-Acetaminophen     REACTION: used to work, lately she would get sick     Current Outpatient Prescriptions  Medication Sig Dispense Refill  . acetaminophen (TYLENOL) 500 MG tablet Take 1,000 mg by mouth 3 (three) times daily.        Marland Kitchen aspirin 81 MG tablet Take 81 mg by mouth daily.        . Calcium Carbonate-Vitamin D (CALCIUM 500+D PO) Take 1 tablet by mouth.        . carvedilol (COREG) 12.5 MG tablet Take 1 tablet (12.5 mg total) by mouth 2 (two) times daily with a meal.  180 tablet  1  . Cholecalciferol (VITAMIN D3) 1000 UNITS tablet Take 1,000 Units by mouth daily.        . digoxin (LANOXIN) 0.125 MG tablet Take 1 tablet (0.125 mg total) by mouth daily.  90 tablet  1  . enalapril (VASOTEC) 10 MG tablet Take 1 tablet (10 mg total) by mouth 2 (two) times daily.  180 tablet  1  . esomeprazole (NEXIUM) 20 MG capsule Take 20 mg by mouth daily.        Marland Kitchen ezetimibe-simvastatin (VYTORIN) 10-40 MG per tablet Take 1 tablet by mouth at bedtime.  90 tablet  3  . fish oil-omega-3 fatty acids 1000 MG capsule Take 2 capsules by mouth daily.         . furosemide (LASIX) 40 MG tablet Take 60 mg by mouth daily.      Marland Kitchen ibuprofen (MOTRIN IB) 200 MG tablet Take 200 mg by mouth 2 (two) times daily as needed for pain.      Marland Kitchen latanoprost (XALATAN) 0.005 % ophthalmic solution Place 1 drop into both eyes at bedtime.       . MULTIPLE VITAMIN PO Take 1 tablet by mouth daily.        Marland Kitchen oxyCODONE-acetaminophen (PERCOCET/ROXICET) 5-325 MG per tablet Take 0.5 tablets by mouth as needed for pain.      . potassium chloride SA (K-DUR,KLOR-CON) 20 MEQ tablet Take 20 mEq by mouth daily.        Marland Kitchen Prochlorperazine Maleate (COMPAZINE PO) Take 25 mg by mouth daily.       No current facility-administered medications for this visit.     Past Medical History  Diagnosis Date  . Pacemaker     Biventricular, [employer]   . CAD in native artery   . LBBB (left bundle branch block)   . Chronic systolic heart failure   . Cardiomyopathy, primary   . Hypertension   . Hyperlipidemia   . Urticaria   . Urinary incontinence   . Osteoarthritis   .  Spinal stenosis, lumbar     ROS:   All systems reviewed and negative except as noted in the HPI.   Past Surgical History  Procedure Laterality Date  . Pacemaker insertion      Biventricular, [employer], ICD   . Amputation      distal left 3rd and 4th fingers as child   . Appendectomy    . Cholecystectomy    . Tonsillectomy    . Kidney surgery      right kidney procdeure, 1970's   . Hip fracture surgery  02/2004     right hip      History reviewed. No pertinent family history.   History   Social History  . Marital Status: Married    Spouse Name: N/A    Number of Children: 5  . Years of Education: N/A   Occupational History  . retired     Horticulturist, commercial   Social History Main Topics  . Smoking status: Former Smoker    Quit date: 12/05/1955  . Smokeless tobacco: Never Used  . Alcohol Use: No  . Drug Use: No  . Sexually Active: Not on file   Other Topics Concern  .  Not on file   Social History Narrative   Has living will.  Discussed DNR- she requests this ("if the Lord decided to take me, just let me go"). Wouldn't want feeding tube if not cognitively aware. Asks that husband, than daughter Schotsi      BP 102/58  Pulse 74  Ht 5\' 2"  (1.575 m)  Wt 134 lb 12 oz (61.122 kg)  BMI 24.64 kg/m2  Physical Exam:  Well appearing 79 year old woman,NAD HEENT: Unremarkable Neck:  7 cm JVD, no thyromegally Back:  No CVA tenderness Lungs:  Clear with no wheezes, rales, or rhonchi. HEART:  Regular rate rhythm, no murmurs, no rubs, no clicks, S2 is split. Abd:  soft, positive bowel sounds, no organomegally, no rebound, no guarding Ext:  2 plus pulses, no edema, no cyanosis, no clubbing Skin:  No rashes no nodules Neuro:  CN II through XII intact, motor grossly intact   DEVICE  Normal device function except for left ventricular lead malfunction.  See PaceArt for details.   Assess/Plan:

## 2013-03-13 NOTE — Assessment & Plan Note (Signed)
Her current symptoms are class IIIA. She is sedentary because she has arthritis and because of her advanced age. She has not been hospitalized for congestive heart failure. She will continue her current medical therapy, maintain a low-sodium diet, and maintain her physical activity.

## 2013-03-13 NOTE — Assessment & Plan Note (Signed)
The patient's left ventricular lead is not functioning and has been turned off. Today we discussed possible treatment options with the patient and her daughter. One option would be to continue as she is. A second option would be proceed with left ventricular lead revision. After considering all the above, and taking into account her very advanced age, I have recommended watchful waiting.

## 2013-06-17 ENCOUNTER — Ambulatory Visit (INDEPENDENT_AMBULATORY_CARE_PROVIDER_SITE_OTHER): Payer: Medicare Other | Admitting: Physician Assistant

## 2013-06-17 ENCOUNTER — Encounter: Payer: Self-pay | Admitting: Physician Assistant

## 2013-06-17 VITALS — BP 108/60 | HR 64 | Ht 61.0 in | Wt 133.0 lb

## 2013-06-17 DIAGNOSIS — I10 Essential (primary) hypertension: Secondary | ICD-10-CM

## 2013-06-17 DIAGNOSIS — Z950 Presence of cardiac pacemaker: Secondary | ICD-10-CM

## 2013-06-17 DIAGNOSIS — E785 Hyperlipidemia, unspecified: Secondary | ICD-10-CM

## 2013-06-17 DIAGNOSIS — I2510 Atherosclerotic heart disease of native coronary artery without angina pectoris: Secondary | ICD-10-CM

## 2013-06-17 DIAGNOSIS — I5022 Chronic systolic (congestive) heart failure: Secondary | ICD-10-CM

## 2013-06-17 DIAGNOSIS — I428 Other cardiomyopathies: Secondary | ICD-10-CM

## 2013-06-17 DIAGNOSIS — R079 Chest pain, unspecified: Secondary | ICD-10-CM

## 2013-06-17 DIAGNOSIS — R0602 Shortness of breath: Secondary | ICD-10-CM

## 2013-06-17 MED ORDER — CARVEDILOL 6.25 MG PO TABS: 6.2500 mg | ORAL_TABLET | Freq: Two times a day (BID) | ORAL | Status: DC

## 2013-06-17 MED ORDER — DIGOXIN 125 MCG PO TABS: 0.1250 mg | ORAL_TABLET | Freq: Every day | ORAL | Status: DC

## 2013-06-17 MED ORDER — FUROSEMIDE 40 MG PO TABS: ORAL_TABLET | ORAL | Status: DC

## 2013-06-17 MED ORDER — ENALAPRIL MALEATE 5 MG PO TABS: 5.0000 mg | ORAL_TABLET | Freq: Two times a day (BID) | ORAL | Status: DC

## 2013-06-17 MED ORDER — FUROSEMIDE 40 MG PO TABS: 40.0000 mg | ORAL_TABLET | Freq: Every day | ORAL | Status: DC

## 2013-06-17 MED ORDER — NITROGLYCERIN 0.4 MG SL SUBL: 0.4000 mg | SUBLINGUAL_TABLET | SUBLINGUAL | Status: AC | PRN

## 2013-06-17 MED ORDER — ENALAPRIL MALEATE 5 MG PO TABS: 5.0000 mg | ORAL_TABLET | Freq: Every day | ORAL | Status: DC

## 2013-06-17 MED ORDER — ISOSORBIDE MONONITRATE ER 30 MG PO TB24: ORAL_TABLET | ORAL | Status: DC

## 2013-06-17 NOTE — Progress Notes (Signed)
[address], [address] [place], [place]  [address] Phone: [phone] Fax:  [phone]  Date:  06/17/2013   ID:  [name] [name], DOB 06-03-1921, MRN [id number]  PCP:  Alva Garnet., MD  Cardiologist:  Dr. Valera Castle => Dr. Olga Millers  Electrophysiologist:  Dr. Lewayne Bunting    History of Present Illness: [name] [name] is a 79 y.o. female who returns for f/u.  She has a hx of CAD, NICM, systolic CHF, LBBB, HTN, HL, lumbar spinal stenosis.  LHC in 5/02:  pLAD 70, oD1 80, oD2 80, mRCA 30, EF 20%.  CM was felt to be out of proportion to her CAD.  Med Rx was recommended for CAD.  Notes indicate she had a nuclear perfusion study around the time of her cath that did not demonstrate ischemia.  I cannot locate the report in her chart.  She is s/p BiV pacemaker.  EF has improved over time.  Echo 4/11: mild LVH, EF 50%, Gr 2 DD, mild AI, MAC, mild to mod MR, mod LAE, mild RAE, PASP 44-48.  Last seen by Dr. Valera Castle in 06/2102.  Medications had been adjusted prior to that for orthostasis.  Last seen by Dr. Lewayne Bunting in 4/14.  Her LV was not functioning and it was felt to likely be from a fx lead.  The LV lead was turned off and Dr. Ladona Ridgel did not recommend trying to replace it at that time.    She comes in with her daughter today.  They are frustrated with how her medications have been sent in to her pharmacy.  They tell me they came in specifically to make sure this is corrected.  She notes significant fatigue and DOE.  She is NYHA Class III.  She also notes CP for years.  This seems to be getting worse.  She is somewhat vague about this.  She often states she doesn't have CP.  But, when her daughter reminds her that she always complains of CP with walking fast or doing laundry, she confirms this.  She denies weight gain, orthopnea, PND, edema.  She feels her CP is getting worse.  She denies radiating symptoms.  She does notes SOB assoc with CP at times.  No assoc nausea or  diaphoresis.  She has not tried NTG.  Pain improves with rest.  She denies syncope.    Labs (4/13):  K 4.3, Cr 0.6   Wt Readings from Last 3 Encounters:  06/17/13 133 lb (60.328 kg)  03/13/13 134 lb 12 oz (61.122 kg)  01/28/13 137 lb 12.8 oz (62.506 kg)     Past Medical History  Diagnosis Date  . Pacemaker     Biventricular, [employer]   . CAD in native artery   . LBBB (left bundle branch block)   . Chronic systolic heart failure   . Cardiomyopathy, primary   . Hypertension   . Hyperlipidemia   . Urticaria   . Urinary incontinence   . Osteoarthritis   . Spinal stenosis, lumbar     Current Outpatient Prescriptions  Medication Sig Dispense Refill  . aspirin 81 MG tablet Take 81 mg by mouth daily.        . Cholecalciferol (VITAMIN D3) 1000 UNITS tablet Take 1,000 Units by mouth daily.        Marland Kitchen esomeprazole (NEXIUM) 20 MG capsule Take 20 mg by mouth daily.        Marland Kitchen ezetimibe-simvastatin (VYTORIN) 10-40 MG per tablet Take  1 tablet by mouth at bedtime.  90 tablet  3  . fish oil-omega-3 fatty acids 1000 MG capsule Take 2 capsules by mouth daily.        Marland Kitchen latanoprost (XALATAN) 0.005 % ophthalmic solution Place 1 drop into both eyes at bedtime.       . Multiple Vitamin (MULTIVITAMIN) tablet Take 1 tablet by mouth daily.      . MULTIPLE VITAMIN PO Take 1 tablet by mouth daily.        Marland Kitchen oxyCODONE-acetaminophen (PERCOCET/ROXICET) 5-325 MG per tablet Take 1 tablet by mouth 3 (three) times daily.       . potassium chloride SA (K-DUR,KLOR-CON) 20 MEQ tablet Take 20 mEq by mouth daily.        Marland Kitchen Prochlorperazine Maleate (COMPAZINE PO) Take 25 mg by mouth daily.      . vitamin B-12 (CYANOCOBALAMIN) 1000 MCG tablet Take 1,000 mcg by mouth 2 (two) times daily.      . carvedilol (COREG) 6.25 MG tablet Take 1 tablet (6.25 mg total) by mouth 2 (two) times daily.  180 tablet  3  . digoxin (LANOXIN) 0.125 MG tablet Take 1 tablet (0.125 mg total) by mouth daily.  30 tablet  3  . enalapril  (VASOTEC) 5 MG tablet Take 1 tablet (5 mg total) by mouth 2 (two) times daily.  180 tablet  3  . furosemide (LASIX) 40 MG tablet 2 tablets (total 80mg ) daily  180 tablet  3  . isosorbide mononitrate (IMDUR) 30 MG 24 hr tablet 1/2 tablet (total 15mg ) daily  15 tablet  3  . nitroGLYCERIN (NITROSTAT) 0.4 MG SL tablet Place 1 tablet (0.4 mg total) under the tongue every 5 (five) minutes as needed for chest pain.  25 tablet  3   No current facility-administered medications for this visit.    Allergies:    Allergies  Allergen Reactions  . Codeine   . Morphine   . Propoxyphene-Acetaminophen     REACTION: used to work, lately she would get sick    Social History:  The patient  reports that she quit smoking about 57 years ago. She has never used smokeless tobacco. She reports that she does not drink alcohol or use illicit drugs.   ROS:  Please see the history of present illness.   She has chronic back pain and takes several doses of narcotic pain medications for control of this.   All other systems reviewed and negative.   PHYSICAL EXAM: VS:  BP 108/60  Pulse 64  Ht 5\' 1"  (1.549 m)  Wt 133 lb (60.328 kg)  BMI 25.14 kg/m2 Well nourished, well developed, in no acute distress HEENT: normal Neck: no JVD at 90 degrees Cardiac:  normal S1, S2; RRR; no murmur Lungs:  clear to auscultation bilaterally, no wheezing, rhonchi or rales Abd: soft, nontender, no hepatomegaly Ext: no edema Skin: warm and dry Neuro:  CNs 2-12 intact, no focal abnormalities noted  EKG:  V paced, HR 64     ASSESSMENT AND PLAN:  1. Chest Pain:  She had significant LAD disease 10+ years ago by [facility].  Her symptoms are likely exertional angina (CCS Class III).  With her advanced age, I am not sure an aggressive approach is in her best interest.  I had a long d/w the patient and her daughter today regarding further testing to include cardiac catheterization and the rationale for stress testing.  We also discussed a trial of  medical Rx.  The patient and  her daughter agree to a trial of medical Rx first.  Will start Imdur 15 mg QD.  If her BP gets too low, she can reduce her Enalapril to 5 mg QD.   2. Chronic Combined Systolic and Diastolic CHF:  Volume appears stable.  Will check BMET and BNP.  If BNP is significantly elevated, will adjust Lasix.  We will make sure all of her medications are sent into her pharmacy as she would prefer. 3. Non-Ischemic CM:  EF has improved over time.  Continue beta blocker and ACE inhibitor.   4. S/p BiV Pacemaker:  F/u with Dr. Lewayne Bunting as planned. 5. Hypertension:  Controlled.  Continue current therapy. No further episodes of postural dizziness. 6. Hyperlipidemia:  Continue statin.  7. CAD:  Continue ASA and statin.  Add Imdur as noted. 8. Disposition:  F/u with Dr. Olga Millers as planned in 08/2103.  Return sooner PRN.  Signed, Tereso Newcomer, PA-C  06/17/2013 9:56 PM

## 2013-06-17 NOTE — Patient Instructions (Addendum)
Start imdur (isosorbide) 15mg  daily. This will be 1/2 of a 30mg  tablet daily.   Use nitroglycerin as needed for chest pain.   Your physician recommends that you have lab work today--BMET/BNP.   Keep the appointment that you already have scheduled with Dr Jens Som 08/08/13.

## 2013-06-18 LAB — BASIC METABOLIC PANEL
CO2: 32 mEq/L (ref 19–32)
Calcium: 9.6 mg/dL (ref 8.4–10.5)
Glucose, Bld: 117 mg/dL — ABNORMAL HIGH (ref 70–99)
Potassium: 4.3 mEq/L (ref 3.5–5.1)
Sodium: 138 mEq/L (ref 135–145)

## 2013-08-08 ENCOUNTER — Encounter: Payer: Self-pay | Admitting: Cardiology

## 2013-08-08 ENCOUNTER — Ambulatory Visit (INDEPENDENT_AMBULATORY_CARE_PROVIDER_SITE_OTHER): Payer: Medicare Other | Admitting: Cardiology

## 2013-08-08 VITALS — BP 100/61 | HR 68 | Wt 123.0 lb

## 2013-08-08 DIAGNOSIS — I428 Other cardiomyopathies: Secondary | ICD-10-CM

## 2013-08-08 DIAGNOSIS — I5022 Chronic systolic (congestive) heart failure: Secondary | ICD-10-CM

## 2013-08-08 DIAGNOSIS — I10 Essential (primary) hypertension: Secondary | ICD-10-CM

## 2013-08-08 DIAGNOSIS — I2510 Atherosclerotic heart disease of native coronary artery without angina pectoris: Secondary | ICD-10-CM

## 2013-08-08 DIAGNOSIS — E785 Hyperlipidemia, unspecified: Secondary | ICD-10-CM

## 2013-08-08 MED ORDER — DIGOXIN 125 MCG PO TABS: 0.1250 mg | ORAL_TABLET | Freq: Every day | ORAL | Status: DC

## 2013-08-08 MED ORDER — ISOSORBIDE MONONITRATE ER 30 MG PO TB24: ORAL_TABLET | ORAL | Status: DC

## 2013-08-08 NOTE — Assessment & Plan Note (Signed)
Continue present blood pressure medications. 

## 2013-08-08 NOTE — Assessment & Plan Note (Signed)
Management per electrophysiology. 

## 2013-08-08 NOTE — Progress Notes (Signed)
HPI: 79 year old female previously followed by Dr. Daleen Squibb for followup of CAD, NICM, systolic CHF, LBBB, HTN, HL. LHC in 5/02: pLAD 70, oD1 80, oD2 80, mRCA 30, EF 20%. CM was felt to be out of proportion to her CAD. Med Rx was recommended for CAD. She is s/p BiV pacemaker. EF has improved over time. Echo 4/11: mild LVH, EF 50%, Gr 2 DD, mild AI, MAC, mild to mod MR, mod LAE, mild RAE, PASP 44-48. Last seen by Dr. Lewayne Bunting in 4/14. Her LV lead was not functioning and it was felt to likely be from a fx lead. The LV lead was turned off and Dr. Ladona Ridgel did not recommend trying to replace it at that time. Patient seen by Tereso Newcomer in July of 2014 and felt to be having angina. He recommended medical therapy and Imdur was added. Since then, she has some dyspnea on exertion but no orthopnea or PND. Occasional mild pedal edema. She has chest pain with exertion. All of these symptoms have been present for years. They are unchanged. No syncope.   Current Outpatient Prescriptions  Medication Sig Dispense Refill  . aspirin 81 MG tablet Take 81 mg by mouth daily.        . carvedilol (COREG) 6.25 MG tablet Take 1 tablet (6.25 mg total) by mouth 2 (two) times daily.  180 tablet  3  . Cholecalciferol (VITAMIN D3) 1000 UNITS tablet Take 1,000 Units by mouth daily.        . digoxin (LANOXIN) 0.125 MG tablet Take 1 tablet (0.125 mg total) by mouth daily.  30 tablet  3  . enalapril (VASOTEC) 5 MG tablet Take 1 tablet (5 mg total) by mouth 2 (two) times daily.  180 tablet  3  . esomeprazole (NEXIUM) 20 MG capsule Take 20 mg by mouth daily.        Marland Kitchen ezetimibe-simvastatin (VYTORIN) 10-40 MG per tablet Take 1 tablet by mouth at bedtime.  90 tablet  3  . fish oil-omega-3 fatty acids 1000 MG capsule Take 2 capsules by mouth daily.        . furosemide (LASIX) 40 MG tablet 2 tablets (total 80mg ) daily  180 tablet  3  . isosorbide mononitrate (IMDUR) 30 MG 24 hr tablet 1/2 tablet (total 15mg ) daily  15 tablet  3  .  latanoprost (XALATAN) 0.005 % ophthalmic solution Place 1 drop into both eyes at bedtime.       . MULTIPLE VITAMIN PO Take 1 tablet by mouth daily.        . nitroGLYCERIN (NITROSTAT) 0.4 MG SL tablet Place 1 tablet (0.4 mg total) under the tongue every 5 (five) minutes as needed for chest pain.  25 tablet  3  . oxyCODONE-acetaminophen (PERCOCET/ROXICET) 5-325 MG per tablet Take 1 tablet by mouth 3 (three) times daily.       . potassium chloride SA (K-DUR,KLOR-CON) 20 MEQ tablet Take 20 mEq by mouth daily.        Marland Kitchen Prochlorperazine Maleate (COMPAZINE PO) Take 25 mg by mouth daily.      . vitamin B-12 (CYANOCOBALAMIN) 1000 MCG tablet Take 1,000 mcg by mouth 2 (two) times daily.       No current facility-administered medications for this visit.     Past Medical History  Diagnosis Date  . Pacemaker     Biventricular, [employer]   . CAD in native artery   . LBBB (left bundle branch block)   . Chronic systolic heart failure   .  Cardiomyopathy, primary   . Hypertension   . Hyperlipidemia   . Urticaria   . Urinary incontinence   . Osteoarthritis   . Spinal stenosis, lumbar     Past Surgical History  Procedure Laterality Date  . Pacemaker insertion      Biventricular, [employer], ICD   . Amputation      distal left 3rd and 4th fingers as child   . Appendectomy    . Cholecystectomy    . Tonsillectomy    . Kidney surgery      right kidney procdeure, 1970's   . Hip fracture surgery  02/2004     right hip     History   Social History  . Marital Status: Married    Spouse Name: N/A    Number of Children: 5  . Years of Education: N/A   Occupational History  . retired     Horticulturist, commercial   Social History Main Topics  . Smoking status: Former Smoker    Quit date: 12/05/1955  . Smokeless tobacco: Never Used  . Alcohol Use: No  . Drug Use: No  . Sexual Activity: Not on file   Other Topics Concern  . Not on file   Social History Narrative   Has  living will.  Discussed DNR- she requests this ("if the Lord decided to take me, just let me go"). Wouldn't want feeding tube if not cognitively aware. Asks that husband, than daughter Schotsi     ROS: no fevers or chills, productive cough, hemoptysis, dysphasia, odynophagia, melena, hematochezia, dysuria, hematuria, rash, seizure activity, orthopnea, PND, pedal edema, claudication. Remaining systems are negative.  Physical Exam: Well-developed well-nourished in no acute distress.  Skin is warm and dry.  HEENT is normal.  Neck is supple.  Chest is clear to auscultation with normal expansion.  Cardiovascular exam is regular rate and rhythm.  Abdominal exam nontender or distended. No masses palpated. Extremities show no edema. neuro grossly intact

## 2013-08-08 NOTE — Patient Instructions (Addendum)
Your physician wants you to follow-up in:  6 months. You will receive a reminder letter in the mail two months in advance. If you don't receive a letter, please call our office to schedule the follow-up appointment.   

## 2013-08-08 NOTE — Assessment & Plan Note (Signed)
Continue ACE inhibitor and beta blocker. 

## 2013-08-08 NOTE — Assessment & Plan Note (Signed)
Continue statin. 

## 2013-08-08 NOTE — Assessment & Plan Note (Addendum)
Continue aspirin and statin. Long discussion with patient and daughter today. Given age we'll plan conservative management. Her symptoms have been present intermittently for years. They do not want invasive evaluation.

## 2013-08-08 NOTE — Assessment & Plan Note (Signed)
Patient appears euvolemic on examination. Continue present dose of Lasix.

## 2013-09-09 ENCOUNTER — Ambulatory Visit (INDEPENDENT_AMBULATORY_CARE_PROVIDER_SITE_OTHER): Payer: Medicare Other | Admitting: Internal Medicine

## 2013-09-09 ENCOUNTER — Encounter: Payer: Self-pay | Admitting: Internal Medicine

## 2013-09-09 VITALS — BP 108/66 | HR 72 | Ht 62.0 in | Wt 124.0 lb

## 2013-09-09 DIAGNOSIS — Z950 Presence of cardiac pacemaker: Secondary | ICD-10-CM

## 2013-09-09 DIAGNOSIS — Z45018 Encounter for adjustment and management of other part of cardiac pacemaker: Secondary | ICD-10-CM

## 2013-09-09 DIAGNOSIS — I5022 Chronic systolic (congestive) heart failure: Secondary | ICD-10-CM

## 2013-09-09 DIAGNOSIS — I428 Other cardiomyopathies: Secondary | ICD-10-CM

## 2013-09-09 LAB — PACEMAKER DEVICE OBSERVATION
AL AMPLITUDE: 1.9 mv
AL THRESHOLD: 1 V
DEVICE MODEL PM: 107959
RV LEAD IMPEDENCE PM: 646 Ohm
RV LEAD THRESHOLD: 1.2 V

## 2013-09-09 NOTE — Patient Instructions (Addendum)
You are doing well.  Follow up in 6 months with Dr. Graciela Husbands.

## 2013-09-09 NOTE — Assessment & Plan Note (Signed)
She is euvolemic. We will continue current medications

## 2013-09-09 NOTE — Assessment & Plan Note (Signed)
The patient's device was interrogated.  The information was reviewed. No changes were made in the programming.    

## 2013-09-09 NOTE — Progress Notes (Signed)
Patient Care Team: Merlene Laughter. Renae Gloss, MD as PCP - General (Internal Medicine)   HPI  [name] [name] is a 79 y.o. female Is seen following CRT-P implantation for chronic systolic heart failure in the setting of nonischemic cardiomyopathy. She was found earlier this year to have a high left ventricular pacing threshold and impedance greater than 2500  attributed to lead fracture. This Lead has been inactivated.  It was decided to not pursue revision given her age and sedentary status  She denies significant symptoms of chest pain or shortness of breath. He does not like to maneuver the stairs any longer. Past Medical History  Diagnosis Date  . Pacemaker     Biventricular, [employer]   . CAD in native artery   . LBBB (left bundle branch block)   . Chronic systolic heart failure   . Cardiomyopathy, primary   . Hypertension   . Hyperlipidemia   . Urticaria   . Urinary incontinence   . Osteoarthritis   . Spinal stenosis, lumbar     Past Surgical History  Procedure Laterality Date  . Pacemaker insertion      Biventricular, [employer], ICD   . Amputation      distal left 3rd and 4th fingers as child   . Appendectomy    . Cholecystectomy    . Tonsillectomy    . Kidney surgery      right kidney procdeure, 1970's   . Hip fracture surgery  02/2004     right hip     Current Outpatient Prescriptions  Medication Sig Dispense Refill  . aspirin 81 MG tablet Take 81 mg by mouth daily.        . carvedilol (COREG) 6.25 MG tablet Take 1 tablet (6.25 mg total) by mouth 2 (two) times daily.  180 tablet  3  . Cholecalciferol (VITAMIN D3) 1000 UNITS tablet Take 1,000 Units by mouth daily.        . digoxin (LANOXIN) 0.125 MG tablet Take 1 tablet (0.125 mg total) by mouth daily.  90 tablet  3  . enalapril (VASOTEC) 5 MG tablet Take 1 tablet (5 mg total) by mouth 2 (two) times daily.  180 tablet  3  . esomeprazole (NEXIUM) 20 MG capsule Take 20 mg by mouth daily.        Marland Kitchen  ezetimibe-simvastatin (VYTORIN) 10-40 MG per tablet Take 1 tablet by mouth at bedtime.  90 tablet  3  . fish oil-omega-3 fatty acids 1000 MG capsule Take 2 capsules by mouth daily.        . furosemide (LASIX) 20 MG tablet Take 20 mg by mouth 3 (three) times daily.      . isosorbide mononitrate (IMDUR) 30 MG 24 hr tablet 1/2 tablet (total 15mg ) daily  45 tablet  3  . latanoprost (XALATAN) 0.005 % ophthalmic solution Place 1 drop into both eyes at bedtime.       . MULTIPLE VITAMIN PO Take 1 tablet by mouth daily.        . nitroGLYCERIN (NITROSTAT) 0.4 MG SL tablet Place 1 tablet (0.4 mg total) under the tongue every 5 (five) minutes as needed for chest pain.  25 tablet  3  . oxyCODONE-acetaminophen (PERCOCET/ROXICET) 5-325 MG per tablet Take 1 tablet by mouth 3 (three) times daily.       . potassium chloride SA (K-DUR,KLOR-CON) 20 MEQ tablet Take 20 mEq by mouth daily.        Marland Kitchen Prochlorperazine Maleate (COMPAZINE PO) Take 25  mg by mouth daily.      . vitamin B-12 (CYANOCOBALAMIN) 1000 MCG tablet Take 1,000 mcg by mouth 2 (two) times daily.       No current facility-administered medications for this visit.    Allergies  Allergen Reactions  . Codeine   . Morphine   . Propoxyphene-Acetaminophen     REACTION: used to work, lately she would get sick    Review of Systems negative except from HPI and PMH  Physical Exam BP 108/66  Pulse 72  Ht 5\' 2"  (1.575 m)  Wt 124 lb (56.246 kg)  BMI 22.67 kg/m2 Well developed and well nourished in no acute distress HENT normal E scleral and icterus clear Neck Supple JVP flat; carotids brisk and full Clear to ausculation Device pocket well healed; without hematoma or erythema.  There is no tethering  She does not recall if she was here a number of months ago.Regular rate and rhythm, no murmurs +S4 Soft with active bowel sounds No clubbing cyanosis none Edema Alert and oriented, grossly normal motor and sensory function Skin Warm and  Dry      Assessment and  Plan

## 2014-03-09 ENCOUNTER — Other Ambulatory Visit: Payer: Self-pay | Admitting: *Deleted

## 2014-03-09 DIAGNOSIS — I428 Other cardiomyopathies: Secondary | ICD-10-CM

## 2014-03-09 DIAGNOSIS — I5022 Chronic systolic (congestive) heart failure: Secondary | ICD-10-CM

## 2014-03-09 MED ORDER — CARVEDILOL 6.25 MG PO TABS: 6.2500 mg | ORAL_TABLET | Freq: Two times a day (BID) | ORAL | Status: DC

## 2014-03-17 ENCOUNTER — Ambulatory Visit (INDEPENDENT_AMBULATORY_CARE_PROVIDER_SITE_OTHER): Payer: Medicare Other | Admitting: *Deleted

## 2014-03-17 DIAGNOSIS — I5022 Chronic systolic (congestive) heart failure: Secondary | ICD-10-CM

## 2014-03-17 DIAGNOSIS — I428 Other cardiomyopathies: Secondary | ICD-10-CM

## 2014-03-17 LAB — MDC_IDC_ENUM_SESS_TYPE_INCLINIC
Battery Remaining Longevity: 49 mo
Brady Statistic RA Percent Paced: 21 %
Brady Statistic RV Percent Paced: 98 %
Lead Channel Pacing Threshold Amplitude: 0.6 V
Lead Channel Pacing Threshold Amplitude: 1.2 V
Lead Channel Pacing Threshold Pulse Width: 1 ms
Lead Channel Sensing Intrinsic Amplitude: 1.3 mV
Lead Channel Sensing Intrinsic Amplitude: 6.8 mV
MDC IDC MSMT LEADCHNL RA IMPEDANCE VALUE: 454 Ohm
MDC IDC MSMT LEADCHNL RA PACING THRESHOLD PULSEWIDTH: 0.4 ms
MDC IDC MSMT LEADCHNL RV IMPEDANCE VALUE: 630 Ohm
MDC IDC PG SERIAL: 107959

## 2014-03-17 NOTE — Progress Notes (Signed)
PPM check in office. 

## 2014-03-27 ENCOUNTER — Ambulatory Visit: Payer: Medicare Other | Admitting: Cardiovascular Disease

## 2014-04-10 ENCOUNTER — Encounter: Payer: Self-pay | Admitting: Internal Medicine

## 2014-05-05 ENCOUNTER — Other Ambulatory Visit: Payer: Self-pay | Admitting: Physician Assistant

## 2014-05-06 ENCOUNTER — Other Ambulatory Visit: Payer: Self-pay

## 2014-05-06 MED ORDER — ENALAPRIL MALEATE 5 MG PO TABS: 5.0000 mg | ORAL_TABLET | Freq: Every day | ORAL | Status: DC

## 2014-05-06 MED ORDER — FUROSEMIDE 40 MG PO TABS: 40.0000 mg | ORAL_TABLET | Freq: Every day | ORAL | Status: DC

## 2014-05-06 MED ORDER — ENALAPRIL MALEATE 5 MG PO TABS: ORAL_TABLET | ORAL | Status: DC

## 2014-05-06 MED ORDER — FUROSEMIDE 40 MG PO TABS: ORAL_TABLET | ORAL | Status: DC

## 2014-05-06 NOTE — Telephone Encounter (Signed)
Patient needs appointment with Dr Graciela Husbands and Dr Jens Som

## 2014-06-01 ENCOUNTER — Encounter: Payer: Self-pay | Admitting: Physician Assistant

## 2014-06-01 ENCOUNTER — Ambulatory Visit (INDEPENDENT_AMBULATORY_CARE_PROVIDER_SITE_OTHER): Payer: Medicare Other | Admitting: Physician Assistant

## 2014-06-01 VITALS — BP 117/62 | HR 60 | Ht 62.0 in | Wt 120.0 lb

## 2014-06-01 DIAGNOSIS — I428 Other cardiomyopathies: Secondary | ICD-10-CM

## 2014-06-01 DIAGNOSIS — I10 Essential (primary) hypertension: Secondary | ICD-10-CM

## 2014-06-01 DIAGNOSIS — I25119 Atherosclerotic heart disease of native coronary artery with unspecified angina pectoris: Secondary | ICD-10-CM

## 2014-06-01 DIAGNOSIS — I209 Angina pectoris, unspecified: Secondary | ICD-10-CM

## 2014-06-01 DIAGNOSIS — I5022 Chronic systolic (congestive) heart failure: Secondary | ICD-10-CM

## 2014-06-01 DIAGNOSIS — I2510 Atherosclerotic heart disease of native coronary artery without angina pectoris: Secondary | ICD-10-CM

## 2014-06-01 LAB — BASIC METABOLIC PANEL
BUN: 19 mg/dL (ref 6–23)
CALCIUM: 9.4 mg/dL (ref 8.4–10.5)
CO2: 33 mEq/L — ABNORMAL HIGH (ref 19–32)
Chloride: 102 mEq/L (ref 96–112)
Creatinine, Ser: 0.7 mg/dL (ref 0.4–1.2)
GFR: 78.2 mL/min (ref >60.00)
Glucose, Bld: 108 mg/dL — ABNORMAL HIGH (ref 70–99)
Potassium: 4.1 mEq/L (ref 3.5–5.1)
SODIUM: 141 meq/L (ref 135–145)

## 2014-06-01 MED ORDER — ISOSORBIDE MONONITRATE ER 30 MG PO TB24: 30.0000 mg | ORAL_TABLET | Freq: Every day | ORAL | Status: DC

## 2014-06-01 NOTE — Assessment & Plan Note (Addendum)
Patient has history of CAD and has had 2 episodes of angina recently. I will increase her Imdur to 30 mg daily. Her duaghter will call if she has any further problems. Continue medical management given her age. She will see Dr. Jens Som back in 2 months.

## 2014-06-01 NOTE — Progress Notes (Signed)
HPI: This is a 80 year old female patient of Dr. Jens Som and Dr. Graciela Husbands who has history of coronary artery disease, nonischemic cardiomyopathy with systolic CHF, left bundle branch block, hypertension, hyperlipidemia. Left heart cath in 2002 showed 70% proximal LAD, 80% diagonal 1, 80% diagonal 2, 30% RCA, EF 20%. Cardiomyopathy was felt out of proportion to her CAD. Medical therapy was recommended for her CAD.Echo 4/11: mild LVH, EF 50%, Gr 2 DD, mild AI, MAC, mild to mod MR, mod LAE, mild RAE, PASP 44-48  She is status post by the pacemaker and EF has improved over time. She has a lead that was not functioning it was felt likely from a fractured lead. The LV lead was turned off. She last saw Dr. Graciela Husbands in the [place] office in 09/2013.  The daughter states she's had 2 episodes of angina over the past 3-4 weeks at rest.She noticed her mother was pale, short of breath and complained of chest pain. Each time it was relieved with one or 2 nitroglycerin. She checked her pulse oximeter and it was fine. The patient cannot recall these episodes as she has short-term memory loss. She has no energy and does not do a whole lot. She does get short of breath walking from one end of the house the next.  Allergies -- Codeine   -- Morphine   -- Propoxyphene N-Acetaminophen    --  REACTION: used to work, lately she would get sick  Current Outpatient Prescriptions on File Prior to Visit: aspirin 81 MG tablet, Take 81 mg by mouth daily.  , Disp: , Rfl:  carvedilol (COREG) 6.25 MG tablet, Take 1 tablet (6.25 mg total) by mouth 2 (two) times daily., Disp: 180 tablet, Rfl: 1 Cholecalciferol (VITAMIN D3) 1000 UNITS tablet, Take 1,000 Units by mouth daily.  , Disp: , Rfl:  digoxin (LANOXIN) 0.125 MG tablet, Take 1 tablet (0.125 mg total) by mouth daily., Disp: 90 tablet, Rfl: 3 enalapril (VASOTEC) 5 MG tablet, Take 1 tablet (5 mg total) by mouth daily., Disp: 90 tablet, Rfl: 0 esomeprazole (NEXIUM) 20 MG capsule, Take  20 mg by mouth daily.  , Disp: , Rfl:  ezetimibe-simvastatin (VYTORIN) 10-40 MG per tablet, Take 1 tablet by mouth at bedtime., Disp: 90 tablet, Rfl: 3 fish oil-omega-3 fatty acids 1000 MG capsule, Take 2 capsules by mouth daily.  , Disp: , Rfl:  furosemide (LASIX) 40 MG tablet, Take 1 tablet (40 mg total) by mouth daily., Disp: 90 tablet, Rfl: 0 isosorbide mononitrate (IMDUR) 30 MG 24 hr tablet, 1/2 tablet (total 15mg ) daily, Disp: 45 tablet, Rfl: 3 latanoprost (XALATAN) 0.005 % ophthalmic solution, Place 1 drop into both eyes at bedtime. , Disp: , Rfl:  MULTIPLE VITAMIN PO, Take 1 tablet by mouth daily.  , Disp: , Rfl:  nitroGLYCERIN (NITROSTAT) 0.4 MG SL tablet, Place 1 tablet (0.4 mg total) under the tongue every 5 (five) minutes as needed for chest pain., Disp: 25 tablet, Rfl: 3 oxyCODONE-acetaminophen (PERCOCET/ROXICET) 5-325 MG per tablet, Take 1 tablet by mouth 3 (three) times daily. , Disp: , Rfl:  potassium chloride SA (K-DUR,KLOR-CON) 20 MEQ tablet, Take 20 mEq by mouth daily.  , Disp: , Rfl:  Prochlorperazine Maleate (COMPAZINE PO), Take 25 mg by mouth daily., Disp: , Rfl:  vitamin B-12 (CYANOCOBALAMIN) 1000 MCG tablet, Take 1,000 mcg by mouth 2 (two) times daily., Disp: , Rfl:   No current facility-administered medications on file prior to visit.   Past Medical History:   Pacemaker  Comment:Biventricular, [employer]    CAD in native artery                                         LBBB (left bundle branch block)                              Chronic systolic heart failure                               Cardiomyopathy, primary                                      Hypertension                                                 Hyperlipidemia                                               Urticaria                                                    Urinary incontinence                                         Osteoarthritis                                                Spinal stenosis, lumbar                                     Past Surgical History:   PACEMAKER INSERTION                                             Comment:Biventricular, [employer], ICD    AMPUTATION                                                      Comment:distal left 3rd and 4th fingers as child    APPENDECTOMY  CHOLECYSTECTOMY                                               TONSILLECTOMY                                                 KIDNEY SURGERY                                                  Comment:right kidney procdeure, 1970's    HIP FRACTURE SURGERY                             02/2004         Comment:right hip   No family history on file.   Social History   Marital Status: Married             Spouse Name:                      Years of Education:                 Number of children: 5           Occupational History Occupation          Associate Professor            Comment              retired                                 Restaurant manager, fast food  Social History Main Topics   Smoking Status: Former Smoker                   Packs/Day: 0.00  Years:           Quit date: 12/05/1955   Smokeless Status: Never Used                       Alcohol Use: No             Drug Use: No             Sexual Activity: Not on file        Other Topics            Concern   None on file  Social History Narrative   Has living will.  Discussed DNR- she requests this ("if the Lord decided to take me, just let me go"). Wouldn't want feeding tube if not cognitively aware. Asks that husband, than daughter Schotsi     ROS: see history of present illness otherwise negative   PHYSICAL EXAM: Well-nournished, in no acute distress. Neck: No JVD, HJR, Bruit, or thyroid enlargement  Lungs: No tachypnea, clear without wheezing, rales, or rhonchi  Cardiovascular:  RRR, PMI not displaced, 2/6 systolic murmur at the left sternal border, no gallops, bruit, thrill, or heave.  Abdomen: BS normal. Soft without organomegaly, masses, lesions or tenderness.  Extremities: without cyanosis, clubbing or edema. Good distal pulses bilateral  SKin: Warm, no lesions or rashes   Musculoskeletal: No deformities  Neuro: no focal signs  Ht 5\' 2"  (1.575 m)  Wt 120 lb (54.432 kg)  BMI 21.94 kg/m2   EKG: AV paced at 60 beats per minute

## 2014-06-01 NOTE — Patient Instructions (Addendum)
Your physician wants you to follow-up in: WITH DR. CRENSHAW IN 2 MONTHS You will receive a reminder letter in the mail two months in advance. If you don't receive a letter, please call our office to schedule the follow-up appointment.  Your physician has recommended you make the following change in your medication:   INCREASE YOUR IMDUR 30 MG ONCE A DAY  Your physician recommends that you return for lab work in:  BMET TODAY

## 2014-06-01 NOTE — Assessment & Plan Note (Signed)
No evidence of heart failure on exam today. 

## 2014-06-01 NOTE — Assessment & Plan Note (Signed)
No heart failure on exam. Check bmet.

## 2014-06-01 NOTE — Assessment & Plan Note (Signed)
Blood pressure well controlled

## 2014-06-08 ENCOUNTER — Telehealth: Payer: Self-pay | Admitting: *Deleted

## 2014-06-08 NOTE — Telephone Encounter (Signed)
[facility] called and stated that the patient is requesting a carvedilol 3.125mg  refill but on last office note it is 6.25mg . Please advise. Thanks, MI

## 2014-06-09 MED ORDER — CARVEDILOL 3.125 MG PO TABS: 3.1250 mg | ORAL_TABLET | Freq: Two times a day (BID) | ORAL | Status: DC

## 2014-06-09 NOTE — Telephone Encounter (Signed)
Spoke with pt dtr, she has been cutting the 6.25 mg tabs in 1/2 because her mother is not able to tolerate the increased dosage. New script sent to the pharm for the correct dosage.

## 2014-06-26 ENCOUNTER — Other Ambulatory Visit: Payer: Self-pay | Admitting: Cardiology

## 2014-07-16 ENCOUNTER — Other Ambulatory Visit: Payer: Self-pay | Admitting: Internal Medicine

## 2014-07-28 ENCOUNTER — Encounter: Payer: Self-pay | Admitting: Cardiology

## 2014-07-28 ENCOUNTER — Ambulatory Visit (INDEPENDENT_AMBULATORY_CARE_PROVIDER_SITE_OTHER): Payer: Medicare Other | Admitting: Cardiology

## 2014-07-28 VITALS — BP 124/62 | HR 64 | Ht 60.0 in | Wt 119.6 lb

## 2014-07-28 DIAGNOSIS — I2510 Atherosclerotic heart disease of native coronary artery without angina pectoris: Secondary | ICD-10-CM

## 2014-07-28 DIAGNOSIS — E785 Hyperlipidemia, unspecified: Secondary | ICD-10-CM

## 2014-07-28 DIAGNOSIS — I10 Essential (primary) hypertension: Secondary | ICD-10-CM

## 2014-07-28 DIAGNOSIS — I209 Angina pectoris, unspecified: Secondary | ICD-10-CM

## 2014-07-28 MED ORDER — FUROSEMIDE 80 MG PO TABS: 80.0000 mg | ORAL_TABLET | Freq: Every day | ORAL | Status: DC

## 2014-07-28 MED ORDER — CARVEDILOL 3.125 MG PO TABS: 3.1250 mg | ORAL_TABLET | Freq: Two times a day (BID) | ORAL | Status: DC

## 2014-07-28 MED ORDER — POTASSIUM CHLORIDE CRYS ER 20 MEQ PO TBCR: 20.0000 meq | EXTENDED_RELEASE_TABLET | Freq: Every day | ORAL | Status: DC

## 2014-07-28 NOTE — Assessment & Plan Note (Signed)
Continue present medications. Blood pressure controlled. 

## 2014-07-28 NOTE — Assessment & Plan Note (Signed)
Continue statin. 

## 2014-07-28 NOTE — Assessment & Plan Note (Signed)
Continue present dose of Lasix. Check potassium and renal function. 

## 2014-07-28 NOTE — Assessment & Plan Note (Signed)
Continue ACE inhibitor and beta blocker. She had fatigue with higher doses of beta blocker previously.

## 2014-07-28 NOTE — Assessment & Plan Note (Signed)
Followed by electrophysiology. 

## 2014-07-28 NOTE — Patient Instructions (Signed)
Your physician wants you to follow-up in: 6 MONTHS WITH DR CRENSHAW You will receive a reminder letter in the mail two months in advance. If you don't receive a letter, please call our office to schedule the follow-up appointment.   Your physician recommends that you HAVE LAB WORK TODAY 

## 2014-07-28 NOTE — Progress Notes (Signed)
HPI: FU CAD, NICM, systolic CHF, LBBB, HTN, HL. LHC in 5/02: pLAD 70, oD1 80, oD2 80, mRCA 30, EF 20%. CM was felt to be out of proportion to her CAD. Med Rx was recommended for CAD. She is s/p BiV pacemaker. EF has improved over time. Echo 4/11: mild LVH, EF 50%, Gr 2 DD, mild AI, MAC, mild to mod MR, mod LAE, mild RAE, PASP 44-48. Seen by Dr. Lewayne Bunting in 4/14. Her LV lead was not functioning and it was felt to likely be from a fx lead. The LV lead was turned off and Dr. Ladona Ridgel did not recommend trying to replace it at that time. Seen recently for angina and imdur increased. Since then, She has some dyspnea on exertion but no orthopnea or PND. Chronic mild pedal edema. Her chest pain has resolved. No syncope.   Current Outpatient Prescriptions  Medication Sig Dispense Refill  . aspirin 81 MG tablet Take 81 mg by mouth daily.        . carvedilol (COREG) 3.125 MG tablet Take 1 tablet (3.125 mg total) by mouth 2 (two) times daily with a meal.  180 tablet  3  . Cholecalciferol (VITAMIN D3) 1000 UNITS tablet Take 1,000 Units by mouth daily.        . enalapril (VASOTEC) 5 MG tablet Take 1 tablet (5 mg total) by mouth daily.  90 tablet  0  . esomeprazole (NEXIUM) 20 MG capsule Take 20 mg by mouth daily.        Marland Kitchen ezetimibe-simvastatin (VYTORIN) 10-40 MG per tablet Take 1 tablet by mouth at bedtime.  90 tablet  3  . fish oil-omega-3 fatty acids 1000 MG capsule Take 2 capsules by mouth daily.        . furosemide (LASIX) 40 MG tablet TAKE 2 TABLET DAILY. NEED TO CONTACT OFFICE TO SCHEDULE APPOINTMENT FOR FUTURE REFILLS. [phone].      . isosorbide mononitrate (IMDUR) 30 MG 24 hr tablet Take 1 tablet (30 mg total) by mouth daily.  90 tablet  3  . LANOXIN 125 MCG tablet TAKE 1 TABLET DAILY  90 tablet  2  . latanoprost (XALATAN) 0.005 % ophthalmic solution Place 1 drop into both eyes at bedtime.       . nitroGLYCERIN (NITROSTAT) 0.4 MG SL tablet Place 1 tablet (0.4 mg total) under the tongue  every 5 (five) minutes as needed for chest pain.  25 tablet  3  . oxyCODONE-acetaminophen (PERCOCET/ROXICET) 5-325 MG per tablet Take 1 tablet by mouth 3 (three) times daily.       . potassium chloride SA (K-DUR,KLOR-CON) 20 MEQ tablet Take 20 mEq by mouth daily.        Marland Kitchen Prochlorperazine Maleate (COMPAZINE PO) Take 25 mg by mouth daily as needed.       . vitamin B-12 (CYANOCOBALAMIN) 1000 MCG tablet Take 1,000 mcg by mouth 2 (two) times daily.       No current facility-administered medications for this visit.     Past Medical History  Diagnosis Date  . Pacemaker     Biventricular, [employer]   . CAD in native artery   . LBBB (left bundle branch block)   . Chronic systolic heart failure   . Cardiomyopathy, primary   . Hypertension   . Hyperlipidemia   . Urticaria   . Urinary incontinence   . Osteoarthritis   . Spinal stenosis, lumbar     Past Surgical History  Procedure  Laterality Date  . Pacemaker insertion      Biventricular, [employer], ICD   . Amputation      distal left 3rd and 4th fingers as child   . Appendectomy    . Cholecystectomy    . Tonsillectomy    . Kidney surgery      right kidney procdeure, 1970's   . Hip fracture surgery  02/2004     right hip     History   Social History  . Marital Status: Married    Spouse Name: N/A    Number of Children: 5  . Years of Education: N/A   Occupational History  . retired     Horticulturist, commercial   Social History Main Topics  . Smoking status: Former Smoker    Quit date: 12/05/1955  . Smokeless tobacco: Never Used  . Alcohol Use: No  . Drug Use: No  . Sexual Activity: Not on file   Other Topics Concern  . Not on file   Social History Narrative   Has living will.  Discussed DNR- she requests this ("if the Lord decided to take me, just let me go"). Wouldn't want feeding tube if not cognitively aware. Asks that husband, than daughter Schotsi     ROS: no fevers or chills, productive  cough, hemoptysis, dysphasia, odynophagia, melena, hematochezia, dysuria, hematuria, rash, seizure activity, orthopnea, PND, pedal edema, claudication. Remaining systems are negative.  Physical Exam: Well-developed frail in no acute distress.  Skin is warm and dry.  HEENT is normal.  Neck is supple.  Chest is clear to auscultation with normal expansion.  Cardiovascular exam is regular rate and rhythm.  Abdominal exam nontender or distended. No masses palpated. Extremities show no edema. neuro grossly intact  ECG 06/01/2014-AV paced.

## 2014-07-28 NOTE — Assessment & Plan Note (Signed)
Continue aspirin and statin. 

## 2014-07-29 LAB — BASIC METABOLIC PANEL WITH GFR
BUN: 15 mg/dL (ref 6–23)
CO2: 32 mEq/L (ref 19–32)
Calcium: 9.5 mg/dL (ref 8.4–10.5)
Chloride: 98 mEq/L (ref 96–112)
Creat: 0.78 mg/dL (ref 0.50–1.10)
GFR, EST NON AFRICAN AMERICAN: 67 mL/min
GFR, Est African American: 77 mL/min
Glucose, Bld: 91 mg/dL (ref 70–99)
Potassium: 4.6 mEq/L (ref 3.5–5.3)
Sodium: 140 mEq/L (ref 135–145)

## 2014-09-13 ENCOUNTER — Emergency Department: Payer: Self-pay | Admitting: Emergency Medicine

## 2014-09-13 LAB — COMPREHENSIVE METABOLIC PANEL
ANION GAP: 7 (ref 7–16)
Albumin: 3.7 g/dL (ref 3.4–5.0)
Alkaline Phosphatase: 54 U/L
BUN: 13 mg/dL (ref 7–18)
Bilirubin,Total: 0.7 mg/dL (ref 0.2–1.0)
CALCIUM: 9 mg/dL (ref 8.5–10.1)
Chloride: 102 mmol/L (ref 98–107)
Co2: 32 mmol/L (ref 21–32)
Creatinine: 0.82 mg/dL (ref 0.60–1.30)
EGFR (Non-African Amer.): >60
Glucose: 116 mg/dL — ABNORMAL HIGH (ref 65–99)
Osmolality: 282 (ref 275–301)
POTASSIUM: 3.6 mmol/L (ref 3.5–5.1)
SGOT(AST): 14 U/L — ABNORMAL LOW (ref 15–37)
SGPT (ALT): 16 U/L
Sodium: 141 mmol/L (ref 136–145)
Total Protein: 7.6 g/dL (ref 6.4–8.2)

## 2014-09-13 LAB — URINALYSIS, COMPLETE
Bilirubin,UR: NEGATIVE
Blood: NEGATIVE
Glucose,UR: NEGATIVE mg/dL (ref 0–75)
KETONE: NEGATIVE
Nitrite: NEGATIVE
Ph: 7 (ref 4.5–8.0)
Protein: NEGATIVE
RBC,UR: <1 /HPF (ref 0–5)
Specific Gravity: 1.005 (ref 1.003–1.030)
Squamous Epithelial: <1
WBC UR: 7 /HPF (ref 0–5)

## 2014-09-13 LAB — MAGNESIUM: Magnesium: 2.1 mg/dL

## 2014-09-13 LAB — TROPONIN I: Troponin-I: <0.02 ng/mL

## 2014-09-13 LAB — CBC
HCT: 37.4 % (ref 35.0–47.0)
HGB: 12.4 g/dL (ref 12.0–16.0)
MCH: 33.5 pg (ref 26.0–34.0)
MCHC: 33.1 g/dL (ref 32.0–36.0)
MCV: 101 fL — ABNORMAL HIGH (ref 80–100)
PLATELETS: 146 10*3/uL — AB (ref 150–440)
RBC: 3.71 10*6/uL — ABNORMAL LOW (ref 3.80–5.20)
RDW: 15.1 % — AB (ref 11.5–14.5)
WBC: 7.1 10*3/uL (ref 3.6–11.0)

## 2014-09-15 ENCOUNTER — Encounter: Payer: Self-pay | Admitting: Internal Medicine

## 2014-09-15 ENCOUNTER — Ambulatory Visit (INDEPENDENT_AMBULATORY_CARE_PROVIDER_SITE_OTHER): Payer: Medicare Other | Admitting: Internal Medicine

## 2014-09-15 VITALS — BP 100/60 | HR 63 | Ht 60.0 in | Wt 118.8 lb

## 2014-09-15 DIAGNOSIS — R0602 Shortness of breath: Secondary | ICD-10-CM

## 2014-09-15 DIAGNOSIS — I25119 Atherosclerotic heart disease of native coronary artery with unspecified angina pectoris: Secondary | ICD-10-CM

## 2014-09-15 DIAGNOSIS — Z45018 Encounter for adjustment and management of other part of cardiac pacemaker: Secondary | ICD-10-CM

## 2014-09-15 DIAGNOSIS — I5022 Chronic systolic (congestive) heart failure: Secondary | ICD-10-CM

## 2014-09-15 DIAGNOSIS — I493 Ventricular premature depolarization: Secondary | ICD-10-CM

## 2014-09-15 DIAGNOSIS — R079 Chest pain, unspecified: Secondary | ICD-10-CM

## 2014-09-15 NOTE — Progress Notes (Signed)
Patient Care Team: Andi DevonKimberly Shelton, MD as PCP - General (Internal Medicine)   HPI  [name] A Ranell PatrickCreech is a 80 y.o. female Is seen following CRT-P implantation for chronic systolic heart failure in the setting of nonischemic cardiomyopathy. She was found subsequently to have a high left ventricular pacing threshold and impedance greater than 2500  attributed to lead fracture. This Lead has been inactivated.  It was decided to not pursue revision given her age and sedentary status  Over the weekend she was seen in the emergency room because of an episode of decreased mental status and it palpated heart rate in the 20-40 range. Evaluation was apparently unrevealing.  Overall functional status continues to deteriorate somewhat slowly with dyspnea on exertion. There has been no peripheral edema. She continues to have episodic chest pain. Past Medical History  Diagnosis Date  . Pacemaker     Biventricular, [employer]   . CAD in native artery   . LBBB (left bundle branch block)   . Chronic systolic heart failure   . Cardiomyopathy, primary   . Hypertension   . Hyperlipidemia   . Urticaria   . Urinary incontinence   . Osteoarthritis   . Spinal stenosis, lumbar     Past Surgical History  Procedure Laterality Date  . Pacemaker insertion      Biventricular, [employer], ICD   . Amputation      distal left 3rd and 4th fingers as child   . Appendectomy    . Cholecystectomy    . Tonsillectomy    . Kidney surgery      right kidney procdeure, 1970's   . Hip fracture surgery  02/2004     right hip     Current Outpatient Prescriptions  Medication Sig Dispense Refill  . aspirin 81 MG tablet Take 81 mg by mouth daily.        . carvedilol (COREG) 3.125 MG tablet Take 1 tablet (3.125 mg total) by mouth 2 (two) times daily with a meal.  180 tablet  3  . Cholecalciferol (VITAMIN D3) 1000 UNITS tablet Take 1,000 Units by mouth daily.        . enalapril (VASOTEC) 5 MG tablet Take 1  tablet (5 mg total) by mouth daily.  90 tablet  0  . esomeprazole (NEXIUM) 20 MG capsule Take 20 mg by mouth daily.        Marland Kitchen. ezetimibe-simvastatin (VYTORIN) 10-40 MG per tablet Take 1 tablet by mouth at bedtime.  90 tablet  3  . fish oil-omega-3 fatty acids 1000 MG capsule Take 2 capsules by mouth daily.        . furosemide (LASIX) 80 MG tablet Take 1 tablet (80 mg total) by mouth daily.  90 tablet  4  . isosorbide mononitrate (IMDUR) 30 MG 24 hr tablet Take 1 tablet (30 mg total) by mouth daily.  90 tablet  3  . LANOXIN 125 MCG tablet TAKE 1 TABLET DAILY  90 tablet  2  . latanoprost (XALATAN) 0.005 % ophthalmic solution Place 1 drop into both eyes at bedtime.       . nitroGLYCERIN (NITROSTAT) 0.4 MG SL tablet Place 1 tablet (0.4 mg total) under the tongue every 5 (five) minutes as needed for chest pain.  25 tablet  3  . oxyCODONE-acetaminophen (PERCOCET/ROXICET) 5-325 MG per tablet Take 1 tablet by mouth 3 (three) times daily.       . potassium chloride SA (K-DUR,KLOR-CON) 20 MEQ tablet Take 1 tablet (20 mEq  total) by mouth daily.  90 tablet  4  . Prochlorperazine Maleate (COMPAZINE PO) Take 25 mg by mouth daily as needed.       . vitamin B-12 (CYANOCOBALAMIN) 1000 MCG tablet Take 1,000 mcg by mouth 2 (two) times daily.       No current facility-administered medications for this visit.    Allergies  Allergen Reactions  . Codeine   . Morphine   . Propoxyphene N-Acetaminophen     REACTION: used to work, lately she would get sick    Review of Systems negative except from HPI and PMH  Physical Exam BP 100/60  Pulse 63  Ht 5' (1.524 m)  Wt 118 lb 12 oz (53.865 kg)  BMI 23.19 kg/m2 Well developed and well nourished in no acute distress HENT normal E scleral and icterus clear Neck Supple JVP flat; carotids brisk and full Clear to ausculation Device pocket well healed; without hematoma or erythema.  There is no tethering  She does not recall if she was here a number of months  ago.Regular rate and rhythm, no murmurs +S4 Soft with active bowel sounds No clubbing cyanosis none Edema Alert and oriented, grossly normal motor and sensory function Skin Warm and Dry   ECG demonstrates P. synchronous pacing with PVCs   Assessment and  Plan  Nonischemic cardiomyopathy  Complete heart block  PVCs  Pacemaker-[employer] The patient's device was interrogated.  The information was reviewed. No changes were made in the programming.     Dyspnea on exertion/HFpEF  Overall there has been gradual deterioration in functional status. No clear precipitating factors identified. Her recent hospitalization/ER visit was temporally related to almost certainly palpated PVCs in a pattern of bigeminy/trigeminy as supported by her ECG today. Efforts to overdrive pace the resulting in shortening of the coupling interval; hence, this was an effort in futility. At a rate of 60 they are quite long coupled and thus likely less symptomatic.  She is euvolemic.  We willchek dig level

## 2014-09-15 NOTE — Patient Instructions (Signed)
Your physician wants you to follow-up in: 6 months with Dr. Graciela Husbands. You will receive a reminder letter in the mail two months in advance. If you don't receive a letter, please call our office to schedule the follow-up appointment.  Your physician wants you to follow-up in: 6 months with [facility]. You will receive a reminder letter in the mail two months in advance. If you don't receive a letter, please call our office to schedule the follow-up appointment.  Your next appointment will be scheduled in our new office located at :  [facility]  [address], [address]  [place], [place] [address]

## 2014-09-18 LAB — MDC_IDC_ENUM_SESS_TYPE_INCLINIC
Brady Statistic RV Percent Paced: 81 %
Lead Channel Impedance Value: 630 Ohm
Lead Channel Pacing Threshold Amplitude: 0.8 V
Lead Channel Pacing Threshold Amplitude: 1.2 V
Lead Channel Pacing Threshold Pulse Width: 0.4 ms
Lead Channel Pacing Threshold Pulse Width: 1 ms
Lead Channel Sensing Intrinsic Amplitude: 5.8 mV
MDC IDC MSMT BATTERY REMAINING LONGEVITY: 60 mo
MDC IDC MSMT LEADCHNL RA IMPEDANCE VALUE: 454 Ohm
MDC IDC MSMT LEADCHNL RA SENSING INTR AMPL: 1.1 mV
MDC IDC PG SERIAL: 107959
MDC IDC STAT BRADY RA PERCENT PACED: 19 %

## 2014-09-21 ENCOUNTER — Telehealth: Payer: Self-pay | Admitting: *Deleted

## 2014-09-21 DIAGNOSIS — Z79899 Other long term (current) drug therapy: Secondary | ICD-10-CM

## 2014-09-21 NOTE — Telephone Encounter (Signed)
Scheduled patient for digoxin level per verbal from Dr. Graciela Husbands

## 2014-09-22 ENCOUNTER — Other Ambulatory Visit (INDEPENDENT_AMBULATORY_CARE_PROVIDER_SITE_OTHER): Payer: Medicare Other

## 2014-09-22 DIAGNOSIS — Z79899 Other long term (current) drug therapy: Secondary | ICD-10-CM

## 2014-09-23 LAB — DIGOXIN LEVEL: Digoxin Level: 1 ng/mL (ref 0.9–2.0)

## 2015-01-22 ENCOUNTER — Other Ambulatory Visit: Payer: Self-pay | Admitting: *Deleted

## 2015-01-22 MED ORDER — CARVEDILOL 3.125 MG PO TABS: 3.1250 mg | ORAL_TABLET | Freq: Two times a day (BID) | ORAL | Status: DC

## 2015-02-16 ENCOUNTER — Other Ambulatory Visit: Payer: Self-pay | Admitting: Cardiology

## 2015-02-28 NOTE — Progress Notes (Signed)
HPI: FU CAD, NICM, systolic CHF, LBBB, HTN, HL. LHC in 5/02: pLAD 70, oD1 80, oD2 80, mRCA 30, EF 20%. CM was felt to be out of proportion to her CAD. Med Rx was recommended for CAD. She is s/p BiV pacemaker. EF has improved over time. Echo 4/11: mild LVH, EF 50%, Gr 2 DD, mild AI, MAC, mild to mod MR, mod LAE, mild RAE, PASP 44-48. Seen by Dr. Lewayne Bunting in 4/14. Her LV lead was not functioning and it was felt to likely be from a fx lead. The LV lead was turned off and Dr. Ladona Ridgel did not recommend trying to replace it at that time. Since last seen, the patient has dyspnea with more extreme activities but not with routine activities. It is relieved with rest. It is not associated with chest pain. There is no orthopnea, PND or pedal edema. There is no syncope or palpitations. Occasional brief chest pain relieved with nitroglycerin.   Current Outpatient Prescriptions  Medication Sig Dispense Refill  . aspirin 81 MG tablet Take 81 mg by mouth daily.      . carvedilol (COREG) 3.125 MG tablet Take 1 tablet (3.125 mg total) by mouth 2 (two) times daily with a meal. 180 tablet 0  . Cholecalciferol (VITAMIN D3) 1000 UNITS tablet Take 1,000 Units by mouth daily.      . digoxin (LANOXIN) 0.125 MG tablet TAKE 1 TABLET DAILY 90 tablet 0  . enalapril (VASOTEC) 5 MG tablet Take 1 tablet (5 mg total) by mouth daily. 90 tablet 0  . esomeprazole (NEXIUM) 20 MG capsule Take 20 mg by mouth daily.      Marland Kitchen ezetimibe-simvastatin (VYTORIN) 10-40 MG per tablet Take 1 tablet by mouth at bedtime. 90 tablet 3  . fish oil-omega-3 fatty acids 1000 MG capsule Take 2 capsules by mouth daily.      . furosemide (LASIX) 80 MG tablet Take 1 tablet (80 mg total) by mouth daily. 90 tablet 4  . isosorbide mononitrate (IMDUR) 30 MG 24 hr tablet Take 1 tablet (30 mg total) by mouth daily. 90 tablet 3  . latanoprost (XALATAN) 0.005 % ophthalmic solution Place 1 drop into both eyes at bedtime.     . nitroGLYCERIN (NITROSTAT) 0.4  MG SL tablet Place 1 tablet (0.4 mg total) under the tongue every 5 (five) minutes as needed for chest pain. 25 tablet 3  . oxyCODONE-acetaminophen (PERCOCET/ROXICET) 5-325 MG per tablet Take 1 tablet by mouth 3 (three) times daily.     . potassium chloride SA (K-DUR,KLOR-CON) 20 MEQ tablet Take 1 tablet (20 mEq total) by mouth daily. 90 tablet 4  . Prochlorperazine Maleate (COMPAZINE PO) Take 25 mg by mouth daily as needed.     . vitamin B-12 (CYANOCOBALAMIN) 1000 MCG tablet Take 1,000 mcg by mouth 2 (two) times daily.     No current facility-administered medications for this visit.     Past Medical History  Diagnosis Date  . Pacemaker     Biventricular, [employer]   . CAD in native artery   . LBBB (left bundle branch block)   . Chronic systolic heart failure   . Cardiomyopathy, primary   . Hypertension   . Hyperlipidemia   . Urticaria   . Urinary incontinence   . Osteoarthritis   . Spinal stenosis, lumbar     Past Surgical History  Procedure Laterality Date  . Pacemaker insertion      Biventricular, [employer], ICD   .  Amputation      distal left 3rd and 4th fingers as child   . Appendectomy    . Cholecystectomy    . Tonsillectomy    . Kidney surgery      right kidney procdeure, 1970's   . Hip fracture surgery  02/2004     right hip     History   Social History  . Marital Status: Married    Spouse Name: N/A  . Number of Children: 5  . Years of Education: N/A   Occupational History  . retired     Horticulturist, commercial   Social History Main Topics  . Smoking status: Former Smoker    Quit date: 12/05/1955  . Smokeless tobacco: Never Used  . Alcohol Use: No  . Drug Use: No  . Sexual Activity: Not on file   Other Topics Concern  . Not on file   Social History Narrative   Has living will.  Discussed DNR- she requests this ("if the Lord decided to take me, just let me go"). Wouldn't want feeding tube if not cognitively aware. Asks that  husband, than daughter Schotsi     ROS: no fevers or chills, productive cough, hemoptysis, dysphasia, odynophagia, melena, hematochezia, dysuria, hematuria, rash, seizure activity, orthopnea, PND, pedal edema, claudication. Remaining systems are negative.  Physical Exam: Well-developed well-nourished in no acute distress.  Skin is warm and dry.  HEENT is normal.  Neck is supple.  Chest is clear to auscultation with normal expansion.  Cardiovascular exam is regular rate and rhythm.  Abdominal exam nontender or distended. No masses palpated. Extremities show no edema. neuro grossly intact  ECG sinus rhythm with ventricular pacing.

## 2015-03-02 ENCOUNTER — Encounter: Payer: Self-pay | Admitting: Cardiology

## 2015-03-02 ENCOUNTER — Ambulatory Visit (INDEPENDENT_AMBULATORY_CARE_PROVIDER_SITE_OTHER): Payer: Medicare Other | Admitting: Cardiology

## 2015-03-02 DIAGNOSIS — I10 Essential (primary) hypertension: Secondary | ICD-10-CM

## 2015-03-02 DIAGNOSIS — I2510 Atherosclerotic heart disease of native coronary artery without angina pectoris: Secondary | ICD-10-CM

## 2015-03-02 MED ORDER — ENALAPRIL MALEATE 5 MG PO TABS: 5.0000 mg | ORAL_TABLET | Freq: Every day | ORAL | Status: DC

## 2015-03-02 NOTE — Assessment & Plan Note (Signed)
Continue ARB and beta blocker. 

## 2015-03-02 NOTE — Patient Instructions (Signed)
Your physician wants you to follow-up in: 6 MONTHS WITH DR CRENSHAW You will receive a reminder letter in the mail two months in advance. If you don't receive a letter, please call our office to schedule the follow-up appointment.  

## 2015-03-02 NOTE — Assessment & Plan Note (Signed)
Continue statin. 

## 2015-03-02 NOTE — Assessment & Plan Note (Signed)
Continue aspirin and statin. 

## 2015-03-02 NOTE — Assessment & Plan Note (Signed)
Followed by electrophysiology. 

## 2015-03-02 NOTE — Assessment & Plan Note (Signed)
Blood pressure controlled. Continue present medications. 

## 2015-03-02 NOTE — Assessment & Plan Note (Signed)
Continue present dose of Lasix. 

## 2015-03-09 ENCOUNTER — Telehealth: Payer: Self-pay | Admitting: Cardiology

## 2015-03-09 MED ORDER — ENALAPRIL MALEATE 5 MG PO TABS: 5.0000 mg | ORAL_TABLET | Freq: Two times a day (BID) | ORAL | Status: DC

## 2015-03-09 NOTE — Telephone Encounter (Signed)
New problem    Pt is taking Enalapril 5mg  and need to know how the pt is suppose to be taking them. Please advise daughter.

## 2015-03-09 NOTE — Telephone Encounter (Addendum)
Spoke with pt dtr, she reports the patient has been taking enalapril 5 mg bid since the medicine was started. Explained it is daily on med list dating back to 2014. dtr reports it has never been changed. New script sent to pharmacy for bid dosing of enalapril. Explained we need to make sure the medication list is correct at the next office appointment. Patient dtr voiced understanding

## 2015-04-06 ENCOUNTER — Encounter: Payer: Self-pay | Admitting: *Deleted

## 2015-04-18 ENCOUNTER — Other Ambulatory Visit: Payer: Self-pay | Admitting: Physician Assistant

## 2015-05-14 ENCOUNTER — Ambulatory Visit (INDEPENDENT_AMBULATORY_CARE_PROVIDER_SITE_OTHER): Payer: Medicare Other | Admitting: *Deleted

## 2015-05-14 DIAGNOSIS — Z45018 Encounter for adjustment and management of other part of cardiac pacemaker: Secondary | ICD-10-CM | POA: Diagnosis not present

## 2015-05-14 DIAGNOSIS — I5022 Chronic systolic (congestive) heart failure: Secondary | ICD-10-CM | POA: Diagnosis not present

## 2015-05-14 LAB — CUP PACEART INCLINIC DEVICE CHECK
Brady Statistic RA Percent Paced: 39 %
Date Time Interrogation Session: 20160610040000
Lead Channel Pacing Threshold Amplitude: 0.6 V
Lead Channel Pacing Threshold Amplitude: 1.8 V
Lead Channel Sensing Intrinsic Amplitude: 1.3 mV
Lead Channel Sensing Intrinsic Amplitude: 3.8 mV
Lead Channel Setting Pacing Amplitude: 2.4 V
Lead Channel Setting Pacing Pulse Width: 1 ms
Lead Channel Setting Sensing Sensitivity: 2.5 mV
Lead Channel Setting Sensing Sensitivity: 2.5 mV
MDC IDC MSMT LEADCHNL LV IMPEDANCE VALUE: 2361 Ohm
MDC IDC MSMT LEADCHNL RA IMPEDANCE VALUE: 446 Ohm
MDC IDC MSMT LEADCHNL RA PACING THRESHOLD PULSEWIDTH: 0.4 ms
MDC IDC MSMT LEADCHNL RV IMPEDANCE VALUE: 512 Ohm
MDC IDC MSMT LEADCHNL RV PACING THRESHOLD PULSEWIDTH: 0.4 ms
MDC IDC SET LEADCHNL LV PACING AMPLITUDE: 2 V
MDC IDC SET LEADCHNL LV PACING PULSEWIDTH: 1 ms
MDC IDC SET LEADCHNL RA PACING AMPLITUDE: 2 V
MDC IDC STAT BRADY RV PERCENT PACED: 88 %
Pulse Gen Serial Number: 107959

## 2015-05-14 NOTE — Progress Notes (Signed)
CRT-P device check in clinic. Normal device function. Thresholds, sensing, impedance consistent with previous measurements. Histograms appropriate for patient and level of activity. 2% mode switch---longest 1.1day + ASA only. 39 PMT. Patient RV pacing 88% of the time---LV lead deactivated. Device programmed with appropriate safety margins. Estimated longevity 3.52yrs. ROV w/ SK/B 10/16.

## 2015-05-17 ENCOUNTER — Other Ambulatory Visit: Payer: Self-pay | Admitting: Cardiology

## 2015-05-17 NOTE — Telephone Encounter (Signed)
Rx has been sent to the pharmacy electronically. ° °

## 2015-05-28 ENCOUNTER — Other Ambulatory Visit: Payer: Self-pay | Admitting: Cardiology

## 2015-05-28 ENCOUNTER — Other Ambulatory Visit: Payer: Self-pay

## 2015-05-28 MED ORDER — CARVEDILOL 3.125 MG PO TABS: 3.1250 mg | ORAL_TABLET | Freq: Two times a day (BID) | ORAL | Status: DC

## 2015-06-08 ENCOUNTER — Ambulatory Visit (INDEPENDENT_AMBULATORY_CARE_PROVIDER_SITE_OTHER): Payer: Medicare Other | Admitting: Internal Medicine

## 2015-06-08 ENCOUNTER — Encounter: Payer: Self-pay | Admitting: Internal Medicine

## 2015-06-08 VITALS — BP 104/66 | HR 73 | Ht 60.0 in | Wt 113.2 lb

## 2015-06-08 DIAGNOSIS — I2510 Atherosclerotic heart disease of native coronary artery without angina pectoris: Secondary | ICD-10-CM | POA: Diagnosis not present

## 2015-06-08 DIAGNOSIS — I10 Essential (primary) hypertension: Secondary | ICD-10-CM

## 2015-06-08 DIAGNOSIS — Z45018 Encounter for adjustment and management of other part of cardiac pacemaker: Secondary | ICD-10-CM

## 2015-06-08 DIAGNOSIS — I5022 Chronic systolic (congestive) heart failure: Secondary | ICD-10-CM | POA: Diagnosis not present

## 2015-06-08 LAB — CUP PACEART INCLINIC DEVICE CHECK
Lead Channel Setting Pacing Amplitude: 2 V
Lead Channel Setting Pacing Pulse Width: 1 ms
Lead Channel Setting Sensing Sensitivity: 2.5 mV
Lead Channel Setting Sensing Sensitivity: 2.5 mV
MDC IDC SESS DTM: 20160705171206
MDC IDC SET LEADCHNL LV PACING PULSEWIDTH: 1 ms
MDC IDC SET LEADCHNL RA PACING AMPLITUDE: 2 V
MDC IDC SET LEADCHNL RV PACING AMPLITUDE: 2.4 V
Pulse Gen Serial Number: 107959

## 2015-06-08 NOTE — Patient Instructions (Signed)
Medication Instructions:  Your physician recommends that you continue on your current medications as directed. Please refer to the Current Medication list given to you today.   Labwork: none  Testing/Procedures: none  Follow-Up: Your physician wants you to follow-up in: 9 months with Dr. Klein.  You will receive a reminder letter in the mail two months in advance. If you don't receive a letter, please call our office to schedule the follow-up appointment.   Any Other Special Instructions Will Be Listed Below (If Applicable).   

## 2015-06-08 NOTE — Progress Notes (Signed)
Patient Care Team: Andi Devon, MD as PCP - General (Internal Medicine)   HPI  [name] [name] is a 81 y.o. female Is seen following CRT-P implantation for chronic systolic heart failure in the setting of nonischemic cardiomyopathy. She was found subsequently to have a high left ventricular pacing threshold and impedance greater than 2500  attributed to lead fracture. This Lead has been inactivated.  It was decided to not pursue revision given her age and sedentary status;  functional status is stable  She saw Dr. Marsa Aris 3/16     she is seen today because of atrial fibrillation of greater than 1 days duration detected on her device. It was asymptomatic.  Overall functional status continues to deteriorate somewhat slowly with dyspnea on exertion. There has been no peripheral edema. She continues to have episodic chest pain. Past Medical History  Diagnosis Date  . Pacemaker     Biventricular, [employer]   . CAD in native artery   . LBBB (left bundle branch block)   . Chronic systolic heart failure   . Cardiomyopathy, primary   . Hypertension   . Hyperlipidemia   . Urticaria   . Urinary incontinence   . Osteoarthritis   . Spinal stenosis, lumbar     Past Surgical History  Procedure Laterality Date  . Pacemaker insertion      Biventricular, [employer], ICD   . Amputation      distal left 3rd and 4th fingers as child   . Appendectomy    . Cholecystectomy    . Tonsillectomy    . Kidney surgery      right kidney procdeure, 1970's   . Hip fracture surgery  02/2004     right hip     Current Outpatient Prescriptions  Medication Sig Dispense Refill  . aspirin 81 MG tablet Take 81 mg by mouth daily.      . carvedilol (COREG) 3.125 MG tablet Take 1 tablet (3.125 mg total) by mouth 2 (two) times daily with a meal. 180 tablet 1  . Cholecalciferol (VITAMIN D3) 1000 UNITS tablet Take 1,000 Units by mouth daily.      . digoxin (LANOXIN) 0.125 MG tablet TAKE 1 TABLET  DAILY 90 tablet 2  . enalapril (VASOTEC) 5 MG tablet Take 1 tablet (5 mg total) by mouth 2 (two) times daily. 180 tablet 3  . esomeprazole (NEXIUM) 20 MG capsule Take 20 mg by mouth daily.      Marland Kitchen ezetimibe-simvastatin (VYTORIN) 10-40 MG per tablet Take 1 tablet by mouth at bedtime. 90 tablet 3  . furosemide (LASIX) 80 MG tablet Take 1 tablet (80 mg total) by mouth daily. 90 tablet 4  . isosorbide mononitrate (IMDUR) 30 MG 24 hr tablet TAKE 1 TABLET DAILY 90 tablet 1  . latanoprost (XALATAN) 0.005 % ophthalmic solution Place 1 drop into both eyes at bedtime.     . nitroGLYCERIN (NITROSTAT) 0.4 MG SL tablet Place 1 tablet (0.4 mg total) under the tongue every 5 (five) minutes as needed for chest pain. 25 tablet 3  . oxyCODONE-acetaminophen (PERCOCET/ROXICET) 5-325 MG per tablet Take 1 tablet by mouth 3 (three) times daily.     . potassium chloride SA (K-DUR,KLOR-CON) 20 MEQ tablet Take 1 tablet (20 mEq total) by mouth daily. 90 tablet 4  . Prochlorperazine Maleate (COMPAZINE PO) Take 25 mg by mouth daily as needed.     . vitamin B-12 (CYANOCOBALAMIN) 1000 MCG tablet Take 1,000 mcg by mouth 2 (two) times daily.  No current facility-administered medications for this visit.    Allergies  Allergen Reactions  . Codeine   . Morphine   . Propoxyphene N-Acetaminophen     REACTION: used to work, lately she would get sick    Review of Systems negative except from HPI and PMH  Physical Exam BP 104/66 mmHg  Pulse 73  Ht 5' (1.524 m)  Wt 113 lb 4 oz (51.37 kg)  BMI 22.12 kg/m2 Well developed and well nourished in no acute distress HENT normal E scleral and icterus clear Neck Supple JVP flat; carotids brisk and full Clear to ausculation Device pocket well healed; without hematoma or erythema.  There is no tethering  She does not recall if she was here a number of months ago.Regular rate and rhythm, no murmurs +S4 Soft with active bowel sounds No clubbing cyanosis none Edema Alert and  oriented, grossly normal motor and sensory function Skin Warm and Dry   ECG demonstrates P. synchronous pacing with PVCs   Assessment and  Plan  Nonischemic cardiomyopathy  Complete heart block  PVCs  Pacemaker-[employer] The patient's device was interrogated.  The information was reviewed. No changes were made in the programming.    Atrial fibrillaiotn  Dyspnea on exertion/HF pEF.  Euvolemic continue current meds    She had a prolonged episode of atrial fibrillation on monitoring. This was greater than 24 hours. Her CHADS-VASc score is greater than or equal to 4(age-81, hypertension-1, gender-1, heart failure-1)  I have spoken with Dr. Marsa Aris transitioning her aspirin--apixaban. Renal function was normal 10/15  Last digoxin level was 10/15 1.0   His office will recheck to her regarding this.

## 2015-06-10 IMAGING — CR DG CHEST 1V PORT
1 series · 1 of 1 positions shown · non-contrast
Comparison: October 07, 2009

CLINICAL DATA: Chest pain since early a.m., not relieved with
nitroglycerin

EXAM:
PORTABLE CHEST - 1 VIEW

[ap]
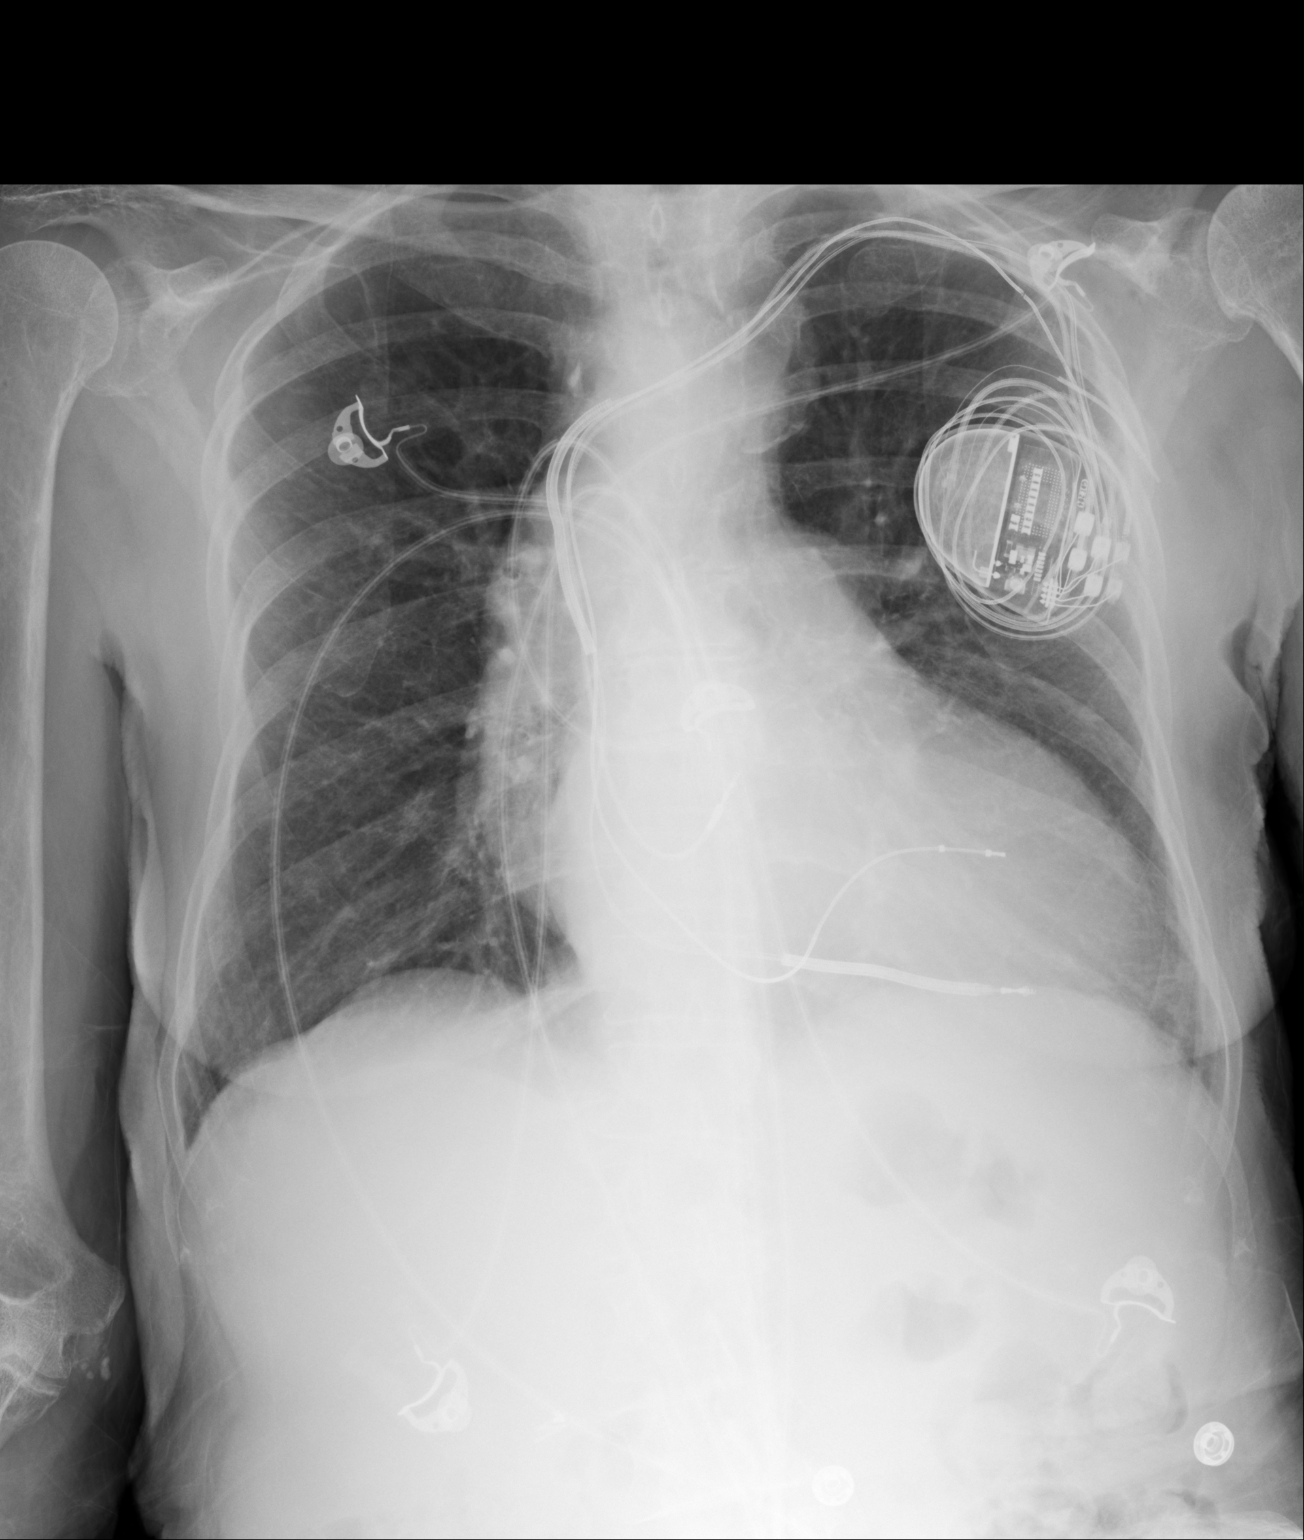

[1 of 1 positions shown; findings below may reference images not displayed]

FINDINGS: There is no edema or consolidation. There is mild scarring in the
left base. Heart is enlarged but stable. Pulmonary vascularity is
within normal limits. No adenopathy. Pacemaker leads are attached to
the right atrium right ventricle. There is an apparent double aortic
arch.
IMPRESSION: There appears to be a double aortic arch. Heart is enlarged. No
edema or consolidation. Scarring left base is stable.

## 2015-06-22 ENCOUNTER — Encounter: Payer: Self-pay | Admitting: Internal Medicine

## 2015-07-09 ENCOUNTER — Telehealth: Payer: Self-pay | Admitting: Cardiology

## 2015-07-09 DIAGNOSIS — I4891 Unspecified atrial fibrillation: Secondary | ICD-10-CM

## 2015-07-09 NOTE — Telephone Encounter (Signed)
[name] states something was supposed to be done for her mother and she has not heard anything.  She states [facility] knows about this.

## 2015-07-09 NOTE — Telephone Encounter (Signed)
Per dr Jens Som, pt to stop aspirin. She will need to start eliquis 2.5 mg twice daily She will need bmp/cbc in 4 weeks

## 2015-07-09 NOTE — Telephone Encounter (Signed)
Left message for pt to call.

## 2015-07-14 MED ORDER — APIXABAN 2.5 MG PO TABS: 2.5000 mg | ORAL_TABLET | Freq: Two times a day (BID) | ORAL | Status: DC

## 2015-07-14 NOTE — Telephone Encounter (Signed)
Left message for pt to call.

## 2015-07-14 NOTE — Telephone Encounter (Signed)
Spoke with pt dtr, aware of medication change. Lab orders and savings card mailed to the pts home address.

## 2015-08-23 ENCOUNTER — Encounter: Payer: Self-pay | Admitting: Internal Medicine

## 2015-08-30 ENCOUNTER — Encounter: Payer: Self-pay | Admitting: *Deleted

## 2015-08-30 ENCOUNTER — Emergency Department: Payer: Medicare Other

## 2015-08-30 ENCOUNTER — Inpatient Hospital Stay
Admission: EM | Admit: 2015-08-30 | Discharge: 2015-09-06 | DRG: 481 | Disposition: A | Discharge status: Home Health | Payer: Medicare Other | Attending: Internal Medicine | Admitting: Internal Medicine

## 2015-08-30 DIAGNOSIS — I447 Left bundle-branch block, unspecified: Secondary | ICD-10-CM | POA: Diagnosis present

## 2015-08-30 DIAGNOSIS — M4806 Spinal stenosis, lumbar region: Secondary | ICD-10-CM | POA: Diagnosis present

## 2015-08-30 DIAGNOSIS — Z886 Allergy status to analgesic agent status: Secondary | ICD-10-CM

## 2015-08-30 DIAGNOSIS — D638 Anemia in other chronic diseases classified elsewhere: Secondary | ICD-10-CM | POA: Diagnosis present

## 2015-08-30 DIAGNOSIS — I429 Cardiomyopathy, unspecified: Secondary | ICD-10-CM | POA: Diagnosis present

## 2015-08-30 DIAGNOSIS — I110 Hypertensive heart disease with heart failure: Secondary | ICD-10-CM | POA: Diagnosis present

## 2015-08-30 DIAGNOSIS — S72009A Fracture of unspecified part of neck of unspecified femur, initial encounter for closed fracture: Secondary | ICD-10-CM

## 2015-08-30 DIAGNOSIS — S72141A Displaced intertrochanteric fracture of right femur, initial encounter for closed fracture: Secondary | ICD-10-CM | POA: Diagnosis present

## 2015-08-30 DIAGNOSIS — D62 Acute posthemorrhagic anemia: Secondary | ICD-10-CM | POA: Diagnosis present

## 2015-08-30 DIAGNOSIS — I2510 Atherosclerotic heart disease of native coronary artery without angina pectoris: Secondary | ICD-10-CM | POA: Diagnosis present

## 2015-08-30 DIAGNOSIS — I5022 Chronic systolic (congestive) heart failure: Secondary | ICD-10-CM | POA: Diagnosis present

## 2015-08-30 DIAGNOSIS — I480 Paroxysmal atrial fibrillation: Secondary | ICD-10-CM | POA: Diagnosis present

## 2015-08-30 DIAGNOSIS — Z79891 Long term (current) use of opiate analgesic: Secondary | ICD-10-CM | POA: Diagnosis not present

## 2015-08-30 DIAGNOSIS — E785 Hyperlipidemia, unspecified: Secondary | ICD-10-CM | POA: Diagnosis present

## 2015-08-30 DIAGNOSIS — I959 Hypotension, unspecified: Secondary | ICD-10-CM | POA: Diagnosis not present

## 2015-08-30 DIAGNOSIS — Z7901 Long term (current) use of anticoagulants: Secondary | ICD-10-CM | POA: Diagnosis not present

## 2015-08-30 DIAGNOSIS — M1611 Unilateral primary osteoarthritis, right hip: Secondary | ICD-10-CM | POA: Diagnosis present

## 2015-08-30 DIAGNOSIS — K219 Gastro-esophageal reflux disease without esophagitis: Secondary | ICD-10-CM | POA: Diagnosis present

## 2015-08-30 DIAGNOSIS — Z8781 Personal history of (healed) traumatic fracture: Secondary | ICD-10-CM | POA: Diagnosis not present

## 2015-08-30 DIAGNOSIS — Z950 Presence of cardiac pacemaker: Secondary | ICD-10-CM | POA: Diagnosis not present

## 2015-08-30 DIAGNOSIS — Z66 Do not resuscitate: Secondary | ICD-10-CM | POA: Diagnosis present

## 2015-08-30 DIAGNOSIS — Z87891 Personal history of nicotine dependence: Secondary | ICD-10-CM | POA: Diagnosis not present

## 2015-08-30 DIAGNOSIS — Y92009 Unspecified place in unspecified non-institutional (private) residence as the place of occurrence of the external cause: Secondary | ICD-10-CM | POA: Diagnosis not present

## 2015-08-30 DIAGNOSIS — S72001A Fracture of unspecified part of neck of right femur, initial encounter for closed fracture: Secondary | ICD-10-CM

## 2015-08-30 DIAGNOSIS — Z885 Allergy status to narcotic agent status: Secondary | ICD-10-CM

## 2015-08-30 DIAGNOSIS — Z79899 Other long term (current) drug therapy: Secondary | ICD-10-CM

## 2015-08-30 DIAGNOSIS — W1830XA Fall on same level, unspecified, initial encounter: Secondary | ICD-10-CM | POA: Diagnosis present

## 2015-08-30 LAB — SURGICAL PCR SCREEN
MRSA, PCR: NEGATIVE
Staphylococcus aureus: POSITIVE — AB

## 2015-08-30 LAB — BASIC METABOLIC PANEL
Anion gap: 9 (ref 5–15)
BUN: 17 mg/dL (ref 6–20)
CO2: 29 mmol/L (ref 22–32)
Calcium: 8.9 mg/dL (ref 8.9–10.3)
Chloride: 99 mmol/L — ABNORMAL LOW (ref 101–111)
Creatinine, Ser: 0.89 mg/dL (ref 0.44–1.00)
GFR calc Af Amer: >60 mL/min (ref >60)
GFR calc non Af Amer: 55 mL/min — ABNORMAL LOW (ref >60)
Glucose, Bld: 144 mg/dL — ABNORMAL HIGH (ref 65–99)
Potassium: 3.6 mmol/L (ref 3.5–5.1)
Sodium: 137 mmol/L (ref 135–145)

## 2015-08-30 LAB — URINALYSIS COMPLETE WITH MICROSCOPIC (ARMC ONLY)
BILIRUBIN URINE: NEGATIVE
GLUCOSE, UA: NEGATIVE mg/dL
KETONES UR: NEGATIVE mg/dL
Leukocytes, UA: NEGATIVE
NITRITE: NEGATIVE
PROTEIN: NEGATIVE mg/dL
Specific Gravity, Urine: 1.016 (ref 1.005–1.030)
pH: 5 (ref 5.0–8.0)

## 2015-08-30 LAB — APTT: aPTT: 38 seconds — ABNORMAL HIGH (ref 24–36)

## 2015-08-30 LAB — ABO/RH: ABO/RH(D): A POS

## 2015-08-30 LAB — CBC WITH DIFFERENTIAL/PLATELET
Basophils Absolute: 0 10*3/uL (ref 0–0.1)
Basophils Relative: 0 %
Eosinophils Absolute: 0 10*3/uL (ref 0–0.7)
Eosinophils Relative: 0 %
HCT: 35.4 % (ref 35.0–47.0)
Hemoglobin: 12 g/dL (ref 12.0–16.0)
Lymphocytes Relative: 22 %
Lymphs Abs: 2.1 10*3/uL (ref 1.0–3.6)
MCH: 34.4 pg — ABNORMAL HIGH (ref 26.0–34.0)
MCHC: 34 g/dL (ref 32.0–36.0)
MCV: 101 fL — ABNORMAL HIGH (ref 80.0–100.0)
Monocytes Absolute: 0.6 10*3/uL (ref 0.2–0.9)
Monocytes Relative: 7 %
Neutro Abs: 7.1 10*3/uL — ABNORMAL HIGH (ref 1.4–6.5)
Neutrophils Relative %: 71 %
Platelets: 146 10*3/uL — ABNORMAL LOW (ref 150–440)
RBC: 3.5 MIL/uL — ABNORMAL LOW (ref 3.80–5.20)
RDW: 15.7 % — ABNORMAL HIGH (ref 11.5–14.5)
WBC: 9.9 10*3/uL (ref 3.6–11.0)

## 2015-08-30 LAB — TYPE AND SCREEN
ABO/RH(D): A POS
Antibody Screen: NEGATIVE

## 2015-08-30 LAB — ALBUMIN: ALBUMIN: 4.1 g/dL (ref 3.5–5.0)

## 2015-08-30 LAB — PROTIME-INR
INR: 1.25
Prothrombin Time: 15.9 seconds — ABNORMAL HIGH (ref 11.4–15.0)

## 2015-08-30 MED ORDER — SODIUM CHLORIDE 0.9 % IV SOLN
INTRAVENOUS | Status: DC
  Administered 2015-08-30: 21:00:00 via INTRAVENOUS

## 2015-08-30 MED ORDER — EZETIMIBE-SIMVASTATIN 10-40 MG PO TABS: 1.0000 | ORAL_TABLET | Freq: Every evening | ORAL | Status: DC

## 2015-08-30 MED ORDER — EZETIMIBE 10 MG PO TABS
10.0000 mg | ORAL_TABLET | Freq: Every day | ORAL | Status: DC
  Administered 2015-08-30 – 2015-09-05 (×7): 10 mg via ORAL
  Filled 2015-08-30 (×7): qty 1

## 2015-08-30 MED ORDER — ONDANSETRON HCL 4 MG PO TABS: 4.0000 mg | ORAL_TABLET | Freq: Four times a day (QID) | ORAL | Status: DC | PRN

## 2015-08-30 MED ORDER — DIGOXIN 125 MCG PO TABS
0.1250 mg | ORAL_TABLET | Freq: Every day | ORAL | Status: DC
  Administered 2015-08-30 – 2015-09-02 (×4): 0.125 mg via ORAL
  Filled 2015-08-30 (×4): qty 1

## 2015-08-30 MED ORDER — ISOSORBIDE MONONITRATE ER 30 MG PO TB24
30.0000 mg | ORAL_TABLET | Freq: Every day | ORAL | Status: DC
  Administered 2015-08-30 – 2015-09-02 (×4): 30 mg via ORAL
  Filled 2015-08-30 (×4): qty 1

## 2015-08-30 MED ORDER — NITROGLYCERIN 0.4 MG SL SUBL: 0.4000 mg | SUBLINGUAL_TABLET | SUBLINGUAL | Status: DC | PRN

## 2015-08-30 MED ORDER — VITAMIN D3 25 MCG (1000 UNIT) PO TABS
1000.0000 [IU] | ORAL_TABLET | Freq: Every day | ORAL | Status: DC
Start: 2015-08-31 — End: 2015-09-06
  Administered 2015-09-01 – 2015-09-06 (×6): 1000 [IU] via ORAL
  Filled 2015-08-30 (×13): qty 1

## 2015-08-30 MED ORDER — HYDROMORPHONE HCL 1 MG/ML IJ SOLN
INTRAMUSCULAR | Status: AC
  Administered 2015-08-30: 2 mg via INTRAVENOUS
  Filled 2015-08-30: qty 2

## 2015-08-30 MED ORDER — CARVEDILOL 3.125 MG PO TABS
3.1250 mg | ORAL_TABLET | Freq: Two times a day (BID) | ORAL | Status: DC
  Administered 2015-08-31 – 2015-09-06 (×6): 3.125 mg via ORAL
  Filled 2015-08-30 (×7): qty 1

## 2015-08-30 MED ORDER — ONDANSETRON HCL 4 MG/2ML IJ SOLN
4.0000 mg | Freq: Four times a day (QID) | INTRAMUSCULAR | Status: DC | PRN
  Administered 2015-08-31: 4 mg via INTRAVENOUS

## 2015-08-30 MED ORDER — ONDANSETRON HCL 4 MG/2ML IJ SOLN
INTRAMUSCULAR | Status: AC
  Administered 2015-08-30: 4 mg
  Filled 2015-08-30: qty 2

## 2015-08-30 MED ORDER — ONDANSETRON HCL 4 MG/2ML IJ SOLN
4.0000 mg | Freq: Once | INTRAMUSCULAR | Status: AC
  Administered 2015-08-30: 4 mg via INTRAVENOUS
  Filled 2015-08-30: qty 2

## 2015-08-30 MED ORDER — DOCUSATE SODIUM 100 MG PO CAPS
100.0000 mg | ORAL_CAPSULE | Freq: Two times a day (BID) | ORAL | Status: DC
  Administered 2015-08-30: 100 mg via ORAL
  Filled 2015-08-30: qty 1

## 2015-08-30 MED ORDER — ACETAMINOPHEN 650 MG RE SUPP: 650.0000 mg | Freq: Four times a day (QID) | RECTAL | Status: DC | PRN

## 2015-08-30 MED ORDER — ONDANSETRON HCL 4 MG/2ML IJ SOLN
4.0000 mg | Freq: Once | INTRAMUSCULAR | Status: DC
  Filled 2015-08-30: qty 2

## 2015-08-30 MED ORDER — PANTOPRAZOLE SODIUM 40 MG PO TBEC
40.0000 mg | DELAYED_RELEASE_TABLET | Freq: Every day | ORAL | Status: DC
  Administered 2015-09-01 – 2015-09-06 (×6): 40 mg via ORAL
  Filled 2015-08-30 (×6): qty 1

## 2015-08-30 MED ORDER — HYDROMORPHONE HCL 1 MG/ML IJ SOLN
0.5000 mg | INTRAMUSCULAR | Status: DC | PRN
  Administered 2015-08-30 – 2015-08-31 (×2): 0.5 mg via INTRAVENOUS
  Filled 2015-08-30 (×2): qty 1

## 2015-08-30 MED ORDER — HYDROMORPHONE HCL 2 MG PO TABS
2.0000 mg | ORAL_TABLET | Freq: Once | ORAL | Status: AC
Start: 2015-08-30 — End: 2015-08-30
  Administered 2015-08-30: 2 mg via ORAL
  Filled 2015-08-30: qty 1

## 2015-08-30 MED ORDER — HYDROMORPHONE HCL 1 MG/ML IJ SOLN
2.0000 mg | Freq: Once | INTRAMUSCULAR | Status: AC
  Administered 2015-08-30: 2 mg via INTRAVENOUS
  Filled 2015-08-30: qty 2

## 2015-08-30 MED ORDER — CARVEDILOL 3.125 MG PO TABS
3.1250 mg | ORAL_TABLET | Freq: Two times a day (BID) | ORAL | Status: AC
  Administered 2015-08-30: 3.125 mg via ORAL
  Filled 2015-08-30: qty 1

## 2015-08-30 MED ORDER — OXYCODONE HCL 5 MG PO TABS
5.0000 mg | ORAL_TABLET | ORAL | Status: DC | PRN
  Administered 2015-08-31: 5 mg via ORAL

## 2015-08-30 MED ORDER — LATANOPROST 0.005 % OP SOLN
1.0000 [drp] | Freq: Every day | OPHTHALMIC | Status: DC
  Administered 2015-08-30 – 2015-09-05 (×7): 1 [drp] via OPHTHALMIC
  Filled 2015-08-30: qty 2.5

## 2015-08-30 MED ORDER — POLYETHYLENE GLYCOL 3350 17 G PO PACK: 17.0000 g | PACK | Freq: Every day | ORAL | Status: DC | PRN

## 2015-08-30 MED ORDER — VITAMIN B-12 1000 MCG PO TABS
1000.0000 ug | ORAL_TABLET | Freq: Two times a day (BID) | ORAL | Status: DC
  Administered 2015-08-30: 1000 ug via ORAL
  Filled 2015-08-30: qty 1

## 2015-08-30 MED ORDER — HYDROMORPHONE HCL 1 MG/ML IJ SOLN
2.0000 mg | Freq: Once | INTRAMUSCULAR | Status: AC
  Administered 2015-08-30: 2 mg via INTRAVENOUS

## 2015-08-30 MED ORDER — ACETAMINOPHEN 325 MG PO TABS: 650.0000 mg | ORAL_TABLET | Freq: Four times a day (QID) | ORAL | Status: DC | PRN

## 2015-08-30 MED ORDER — SIMVASTATIN 40 MG PO TABS
40.0000 mg | ORAL_TABLET | Freq: Every day | ORAL | Status: DC
  Administered 2015-08-30 – 2015-09-05 (×7): 40 mg via ORAL
  Filled 2015-08-30 (×7): qty 1

## 2015-08-30 NOTE — ED Notes (Signed)
Patient transported to X-ray 

## 2015-08-30 NOTE — ED Notes (Signed)
MD at bedside. 

## 2015-08-30 NOTE — Progress Notes (Signed)
One time order for coreg 3.125mg  tonight d/t pt. Missed dose tonight per Dr. Clint Guy.

## 2015-08-30 NOTE — H&P (Signed)
[facility] - [place] [facility]   PATIENT NAME: [name] [name]    MR#:  [id number]  DATE OF BIRTH:  Nov 13, 1921   DATE OF ADMISSION:  08/30/2015  PRIMARY CARE PHYSICIAN: Alva Garnet., MD   REQUESTING/REFERRING PHYSICIAN: Huel Cote  CHIEF COMPLAINT:   Chief Complaint  Patient presents with  . Fall    HISTORY OF PRESENT ILLNESS:  [name] [name]  is a 81 y.o. female with a known history of permanent pacemaker insertion, coronary artery disease presenting after mechanical fall She was at home where she was trying to catch a kitten with a broom unfortunately she was unsuccessful and fell. When she fell she lived on her right side noted immediate pain in her right hip described only as "pain" intensity 8/10 no radiation, worse with activity no relieving factors brought to Hospital further workup and evaluation.    PAST MEDICAL HISTORY:   Past Medical History  Diagnosis Date  . Pacemaker     Biventricular, [employer]   . CAD in native artery   . LBBB (left bundle branch block)   . Chronic systolic heart failure   . Cardiomyopathy, primary   . Hypertension   . Hyperlipidemia   . Urticaria   . Urinary incontinence   . Osteoarthritis   . Spinal stenosis, lumbar     PAST SURGICAL HISTORY:   Past Surgical History  Procedure Laterality Date  . Pacemaker insertion      Biventricular, [employer], ICD   . Amputation      distal left 3rd and 4th fingers as child   . Appendectomy    . Cholecystectomy    . Tonsillectomy    . Kidney surgery      right kidney procdeure, 1970's   . Hip fracture surgery  02/2004     right hip     SOCIAL HISTORY:   Social History  Substance Use Topics  . Smoking status: Former Smoker    Quit date: 12/05/1955  . Smokeless tobacco: Never Used  . Alcohol Use: No    FAMILY HISTORY:   Family History  Problem Relation Age of Onset  . Hypertension Other     DRUG ALLERGIES:   Allergies   Allergen Reactions  . Codeine Nausea And Vomiting  . Morphine Nausea And Vomiting  . Propoxyphene N-Acetaminophen Nausea And Vomiting    REVIEW OF SYSTEMS:  REVIEW OF SYSTEMS:  CONSTITUTIONAL: Denies fevers, chills, fatigue, weakness.  EYES: Denies blurred vision, double vision, or eye pain.  EARS, NOSE, THROAT: Denies tinnitus, ear pain, hearing loss.  RESPIRATORY: denies cough, shortness of breath, wheezing  CARDIOVASCULAR: Denies chest pain, palpitations, edema.  GASTROINTESTINAL: Denies nausea, vomiting, diarrhea, abdominal pain.  GENITOURINARY: Denies dysuria, hematuria.  ENDOCRINE: Denies nocturia or thyroid problems. HEMATOLOGIC AND LYMPHATIC: Denies easy bruising or bleeding.  SKIN: Denies rash or lesions.  MUSCULOSKELETAL: Denies pain in neck, back, shoulder, knees, or further arthritic symptoms. Positive right hip pain as described above NEUROLOGIC: Denies paralysis, paresthesias.  PSYCHIATRIC: Denies anxiety or depressive symptoms. Otherwise full review of systems performed by me is negative.   MEDICATIONS AT HOME:   Prior to Admission medications   Medication Sig Start Date End Date Taking? Authorizing Provider  apixaban (ELIQUIS) 2.5 MG TABS tablet Take 1 tablet (2.5 mg total) by mouth 2 (two) times daily. 07/14/15  Yes Lewayne Bunting, MD  carvedilol (COREG) 3.125 MG tablet Take 1 tablet (3.125 mg total) by mouth 2 (two) times daily with a  meal. 05/28/15  Yes Lewayne Bunting, MD  Cholecalciferol (VITAMIN D3) 1000 UNITS tablet Take 1,000 Units by mouth daily.     Yes Historical Provider, MD  digoxin (LANOXIN) 0.125 MG tablet Take 0.125 mg by mouth at bedtime.   Yes Historical Provider, MD  enalapril (VASOTEC) 5 MG tablet Take 1 tablet (5 mg total) by mouth 2 (two) times daily. 03/09/15  Yes Lewayne Bunting, MD  esomeprazole (NEXIUM) 20 MG capsule Take 20 mg by mouth daily.     Yes Historical Provider, MD  ezetimibe-simvastatin (VYTORIN) 10-40 MG per tablet Take 1  tablet by mouth at bedtime. Patient taking differently: Take 1 tablet by mouth every evening.  05/11/11  Yes Gaylord Shih, MD  furosemide (LASIX) 80 MG tablet Take 1 tablet (80 mg total) by mouth daily. 07/28/14  Yes Lewayne Bunting, MD  isosorbide mononitrate (IMDUR) 30 MG 24 hr tablet Take 30 mg by mouth at bedtime.   Yes Historical Provider, MD  latanoprost (XALATAN) 0.005 % ophthalmic solution Place 1 drop into both eyes at bedtime.    Yes Historical Provider, MD  nitroGLYCERIN (NITROSTAT) 0.4 MG SL tablet Place 1 tablet (0.4 mg total) under the tongue every 5 (five) minutes as needed for chest pain. 06/17/13  Yes Beatrice Lecher, PA-C  oxyCODONE-acetaminophen (PERCOCET/ROXICET) 5-325 MG per tablet Take 1 tablet by mouth 3 (three) times daily.    Yes Historical Provider, MD  potassium chloride SA (K-DUR,KLOR-CON) 20 MEQ tablet Take 1 tablet (20 mEq total) by mouth daily. 07/28/14  Yes Lewayne Bunting, MD  promethazine (PHENERGAN) 25 MG tablet Take 25 mg by mouth every 6 (six) hours as needed for nausea or vomiting.   Yes Historical Provider, MD  vitamin B-12 (CYANOCOBALAMIN) 1000 MCG tablet Take 1,000 mcg by mouth 2 (two) times daily.   Yes Historical Provider, MD      VITAL SIGNS:  Blood pressure 129/47, pulse 84, temperature 98 F (36.7 C), temperature source Oral, resp. rate 18, height 5\' 2"  (1.575 m), weight 150 lb 9.6 oz (68.312 kg), SpO2 97 %.  PHYSICAL EXAMINATION:  VITAL SIGNS: Filed Vitals:   08/30/15 2125  BP: 129/47  Pulse: 84  Temp: 98 F (36.7 C)  Resp: 18   GENERAL:81 y.o.female currently in no acute distress.  HEAD: Normocephalic, atraumatic.  EYES: Pupils equal, round, reactive to light. Extraocular muscles intact. No scleral icterus.  MOUTH: Moist mucosal membrane. Dentition intact. No abscess noted.  EAR, NOSE, THROAT: Clear without exudates. No external lesions.  NECK: Supple. No thyromegaly. No nodules. No JVD.  PULMONARY: Clear to ascultation, without wheeze  rails or rhonci. No use of accessory muscles, Good respiratory effort. good air entry bilaterally CHEST: Nontender to palpation.  CARDIOVASCULAR: S1 and S2. Regular rate and rhythm. 3/6 systolic ejection murmur, rubs, or gallops. No edema. Pedal pulses 2+ bilaterally.  GASTROINTESTINAL: Soft, nontender, nondistended. No masses. Positive bowel sounds. No hepatosplenomegaly.  MUSCULOSKELETAL: No swelling, clubbing, or edema. Range of motion full in all extremities.  NEUROLOGIC: Cranial nerves II through XII are intact. No gross focal neurological deficits. Sensation intact. Reflexes intact.  SKIN: No ulceration, lesions, rashes, or cyanosis. Skin warm and dry. Turgor intact.  PSYCHIATRIC: Mood, affect within normal limits. The patient is awake, alert and oriented x 3. Insight, judgment intact.    LABORATORY PANEL:   CBC  Recent Labs Lab 08/30/15 1735  WBC 9.9  HGB 12.0  HCT 35.4  PLT 146*   ------------------------------------------------------------------------------------------------------------------  Chemistries  Recent Labs Lab 08/30/15 1735  NA 137  K 3.6  CL 99*  CO2 29  GLUCOSE 144*  BUN 17  CREATININE 0.89  CALCIUM 8.9   ------------------------------------------------------------------------------------------------------------------  Cardiac Enzymes No results for input(s): TROPONINI in the last 168 hours. ------------------------------------------------------------------------------------------------------------------  RADIOLOGY:  Ct Hip Right Wo Contrast  08/30/2015   CLINICAL DATA:  81 year old female post unwitnessed fall outside today, now with right hip pain. History of prior right hip surgery.  EXAM: CT OF THE RIGHT HIP WITHOUT CONTRAST  TECHNIQUE: Multidetector CT imaging of the right hip was performed according to the standard protocol. Multiplanar CT image reconstructions were also generated.  COMPARISON:  Radiographs earlier this day, concerning for  intertrochanteric fracture.  FINDINGS: Comminuted fracture of the intertrochanteric right hip with displacement involving the greater and lesser trochanters. Fracture extends to the base of the femoral neck. There is subtrochanteric extension about the medial cortex. There ghost tracks with femoral neck from prior surgery. Severe osteoarthritis of the right hip with complete joint space loss, subchondral cystic change in scleroses. Small intra-articular bodies seen inferior medially in the joint space. In cuboid acetabulum pubic rami are intact without additional fracture. Probable hematoma tracking in the medial upper thigh musculature, partially included.  IMPRESSION: 1. Comminuted displaced intertrochanteric right hip fracture extending to the base of femoral neck. There is subtrochanteric involvement involving the medial cortex. 2. Advanced osteoarthritis of the right hip. Postsurgical change with ghost tracks to the femoral neck.   Electronically Signed   By: Rubye Oaks M.D.   On: 08/30/2015 19:55   Dg Chest Port 1 View  08/30/2015   CLINICAL DATA:  Initial encounter for preoperative respiratory evaluation for hip fracture.  EXAM: PORTABLE CHEST 1 VIEW  COMPARISON:  09/13/2014.  FINDINGS: The lungs are clear without focal pneumonia, edema, pneumothorax or pleural effusion. The cardio pericardial silhouette is enlarged. Left sided permanent pacemaker/ AICD remains in place. Telemetry leads overlie the chest. Bones are diffusely demineralized.  IMPRESSION: Stable exam. Cardiomegaly without acute cardiopulmonary findings in this patient with known right-sided aortic arch.   Electronically Signed   By: Kennith Center M.D.   On: 08/30/2015 18:28   Dg Hip Unilat  With Pelvis 2-3 Views Right  08/30/2015   CLINICAL DATA:  Fall. Remote history of RIGHT hip fracture. RIGHT hip pain. Initial encounter.  EXAM: DG HIP (WITH OR WITHOUT PELVIS) 2-3V RIGHT  COMPARISON:  None.  FINDINGS: There is no comparison  available to assess for interval changes. Lucency extends through the intertrochanteric RIGHT femur, suspicious for an acute intertrochanteric femur fracture. Osteopenia is present. Severe RIGHT hip osteoarthritis is present. Given the history, the deformity in the RIGHT femoral neck could be chronic. Noncontrast CT should be considered for further assessment in this patient with acute on chronic injury.  The obturator rings appear intact.  IMPRESSION: Deformity of the proximal RIGHT femur is suspicious for an acute intertrochanteric femur fracture however given the underlying healed fracture, a noncontrast CT of the RIGHT hip is recommended for further assessment.   Electronically Signed   By: Andreas Newport M.D.   On: 08/30/2015 18:27    EKG:   Orders placed or performed during the hospital encounter of 08/30/15  . ED EKG  . ED EKG  . EKG 12-Lead  . EKG 12-Lead    IMPRESSION AND PLAN:   81 year old Caucasian female history of coronary artery disease paroxysmal atrial fibrillation presenting after mechanical fall  1. Preoperative evaluation for right intertrochanteric fracture:  She considered a moderate risk for moderate surgery from cardiac standpoint, mets should be considered less than 4 given general mobility issues. She denies any active cardiovascular issues including angina, significant arrhythmia, significant valvular dysfunction, signs/symptoms of congestive heart failure. As far as medications are concerned she takes Eliquis 2.5 mg by mouth twice a day she did not take the evening dose ideally she should miss a total of 3 doses prior to surgery which would place her surgery either late evening 08/31/2015 or on 09/01/2015 restart Eliquis 24 hours after surgery no bridging required. Continue with majority of her home medications will hold enalapril in the perioperative period can restart after surgery to avoid intraoperative hypotension. In the meantime continue to provide pain medication,  bowel regimen:  2. GERD without Esophagitis: PPI therapy 3. Paroxysmal atrial fibrillation: Anticoagulation as stated above 4. Coronary artery disease continue with beta blockade, statin therapy 5. Venous embolism prophylactic SCDs  All the records are reviewed and case discussed with ED provider. Management plans discussed with the patient, family and they are in agreement.  CODE STATUS: DO NOT RESUSCITATE  TOTAL TIME TAKING CARE OF THIS PATIENT: 35 minutes.    Hower,  Mardi Mainland.D on 08/30/2015 at 10:05 PM  Between 7am to 6pm - Pager - [phone]  After 6pm: House Pager: [phone]  [facility]  Office  [phone]  CC: Primary care physician; Alva Garnet., MD

## 2015-08-30 NOTE — ED Provider Notes (Signed)
Time Seen: Approximately 1740 I have reviewed the triage notes  Chief Complaint: Fall   History of Present Illness: [name] [name] is a 81 y.o. female who presents after a fall outside today. Patient denies any loss of consciousness and apparently was trying to blow some cats away with a broom. The patient lost her balance and fell primarily landing on her right side. She denies any loss of consciousness or head trauma. Patient's primarily stating right hip pain. She was transported here by EMS and had multiple doses of fentanyl prior to arrival. She states she has a history of allergies to narcotics but is mostly nausea and vomiting. Patient felt fine prior to her fall.   Past Medical History  Diagnosis Date  . Pacemaker     Biventricular, [employer]   . CAD in native artery   . LBBB (left bundle branch block)   . Chronic systolic heart failure   . Cardiomyopathy, primary   . Hypertension   . Hyperlipidemia   . Urticaria   . Urinary incontinence   . Osteoarthritis   . Spinal stenosis, lumbar     Patient Active Problem List   Diagnosis Date Noted  . Orthostatic hypotension 03/25/2012  . Biventricular [employer] 07/24/2011  . CAD, NATIVE VESSEL 04/04/2010  . LBBB 08/17/2009  . SYSTOLIC HEART FAILURE, CHRONIC 05/10/2009  . URTICARIA 02/02/2009  . HYPERLIPIDEMIA 12/16/2008  . Essential hypertension 12/16/2008  . CARDIOMYOPATHY, PRIMARY 12/16/2008  . GERD 12/16/2008  . OSTEOARTHRITIS 12/16/2008  . SPINAL STENOSIS, LUMBAR 12/16/2008  . URINARY INCONTINENCE 12/16/2008    Past Surgical History  Procedure Laterality Date  . Pacemaker insertion      Biventricular, [employer], ICD   . Amputation      distal left 3rd and 4th fingers as child   . Appendectomy    . Cholecystectomy    . Tonsillectomy    . Kidney surgery      right kidney procdeure, 1970's   . Hip fracture surgery  02/2004     right hip     Past Surgical History  Procedure  Laterality Date  . Pacemaker insertion      Biventricular, [employer], ICD   . Amputation      distal left 3rd and 4th fingers as child   . Appendectomy    . Cholecystectomy    . Tonsillectomy    . Kidney surgery      right kidney procdeure, 1970's   . Hip fracture surgery  02/2004     right hip     Current Outpatient Rx  Name  Route  Sig  Dispense  Refill  . apixaban (ELIQUIS) 2.5 MG TABS tablet   Oral   Take 1 tablet (2.5 mg total) by mouth 2 (two) times daily.   60 tablet   6   . carvedilol (COREG) 3.125 MG tablet   Oral   Take 1 tablet (3.125 mg total) by mouth 2 (two) times daily with a meal.   180 tablet   1   . Cholecalciferol (VITAMIN D3) 1000 UNITS tablet   Oral   Take 1,000 Units by mouth daily.           . digoxin (LANOXIN) 0.125 MG tablet      TAKE 1 TABLET DAILY   90 tablet   2   . enalapril (VASOTEC) 5 MG tablet   Oral   Take 1 tablet (5 mg total) by mouth 2 (two) times daily.  180 tablet   3   . esomeprazole (NEXIUM) 20 MG capsule   Oral   Take 20 mg by mouth daily.           Marland Kitchen ezetimibe-simvastatin (VYTORIN) 10-40 MG per tablet   Oral   Take 1 tablet by mouth at bedtime.   90 tablet   3   . furosemide (LASIX) 80 MG tablet   Oral   Take 1 tablet (80 mg total) by mouth daily.   90 tablet   4   . isosorbide mononitrate (IMDUR) 30 MG 24 hr tablet      TAKE 1 TABLET DAILY   90 tablet   1   . latanoprost (XALATAN) 0.005 % ophthalmic solution   Both Eyes   Place 1 drop into both eyes at bedtime.          . nitroGLYCERIN (NITROSTAT) 0.4 MG SL tablet   Sublingual   Place 1 tablet (0.4 mg total) under the tongue every 5 (five) minutes as needed for chest pain.   25 tablet   3   . oxyCODONE-acetaminophen (PERCOCET/ROXICET) 5-325 MG per tablet   Oral   Take 1 tablet by mouth 3 (three) times daily.          . potassium chloride SA (K-DUR,KLOR-CON) 20 MEQ tablet   Oral   Take 1 tablet (20 mEq total) by mouth daily.    90 tablet   4   . Prochlorperazine Maleate (COMPAZINE PO)   Oral   Take 25 mg by mouth daily as needed.          . vitamin B-12 (CYANOCOBALAMIN) 1000 MCG tablet   Oral   Take 1,000 mcg by mouth 2 (two) times daily.           Allergies:  Codeine; Morphine; and Propoxyphene n-acetaminophen  Family History: Family History  Problem Relation Age of Onset  . Family history unknown: Yes    Social History: Social History  Substance Use Topics  . Smoking status: Former Smoker    Quit date: 12/05/1955  . Smokeless tobacco: Never Used  . Alcohol Use: No     Review of Systems:   10 point review of systems was performed and was otherwise negative:  Constitutional: No fever Eyes: No visual disturbances ENT: No sore throat, ear pain Cardiac: No chest pain Respiratory: No shortness of breath, wheezing, or stridor Abdomen: No abdominal pain, no vomiting, No diarrhea Endocrine: No weight loss, No night sweats Extremities: No peripheral edema, cyanosis Skin: No rashes, easy bruising Neurologic: No focal weakness, trouble with speech or swollowing Urologic: No dysuria, Hematuria, or urinary frequency   Physical Exam:  ED Triage Vitals  Enc Vitals Group     BP 08/30/15 1730 137/87 mmHg     Pulse Rate 08/30/15 1730 60     Resp 08/30/15 1730 18     Temp 08/30/15 1730 98.4 F (36.9 C)     Temp Source 08/30/15 1730 Oral     SpO2 08/30/15 1730 97 %     Weight 08/30/15 1730 112 lb (50.803 kg)     Height 08/30/15 1730  (1.575 m)     Head Cir --      Peak Flow --      Pain Score 08/30/15 1731 8     Pain Loc --      Pain Edu? --      Excl. in GC? --     General: Awake , Alert , and  Oriented times 3; GCS 15 Head: Normal cephalic , atraumatic Eyes: Pupils equal , round, reactive to light Nose/Throat: No nasal drainage, patent upper airway without erythema or exudate.  Neck: Supple, Full range of motion, No anterior adenopathy or palpable thyroid masses Lungs: Clear  to ascultation without wheezes , rhonchi, or rales Heart: Regular rate, regular rhythm without murmurs , gallops , or rubs Abdomen: Soft, non tender without rebound, guarding , or rigidity; bowel sounds positive and symmetric in all 4 quadrants. No organomegaly .        Extremities: Right lower extremity shows shortening and the patient has a level of, further with the hip at valgus rotation of the extremity is neurovascularly intact. There is no reproducible knee or ankle pain. Neurologic: normal ambulation, Motor symmetric without deficits, sensory intact Skin: warm, dry, no rashes   Labs:   All laboratory work was reviewed including any pertinent negatives or positives listed below:  Labs Reviewed  BASIC METABOLIC PANEL  PROTIME-INR  CBC WITH DIFFERENTIAL/PLATELET  TYPE AND SCREEN    EKG:  ED ECG REPORT I, Jennye Moccasin, the attending physician, personally viewed and interpreted this ECG.  Date: 08/30/2015 EKG Time: 1823 Rate: 73 Rhythm: AV sequential pacemaker with occasional PVCs QRS Axis: normal Intervals: normal ST/T Wave abnormalities: normal Conduction Disutrbances: none Narrative Interpretation: unremarkable    Radiology:  EXAM: CT OF THE RIGHT HIP WITHOUT CONTRAST  TECHNIQUE: Multidetector CT imaging of the right hip was performed according to the standard protocol. Multiplanar CT image reconstructions were also generated.  COMPARISON: Radiographs earlier this day, concerning for intertrochanteric fracture.  FINDINGS: Comminuted fracture of the intertrochanteric right hip with displacement involving the greater and lesser trochanters. Fracture extends to the base of the femoral neck. There is subtrochanteric extension about the medial cortex. There ghost tracks with femoral neck from prior surgery. Severe osteoarthritis of the right hip with complete joint space loss, subchondral cystic change in scleroses. Small intra-articular bodies seen inferior  medially in the joint space. In cuboid acetabulum pubic rami are intact without additional fracture. Probable hematoma tracking in the medial upper thigh musculature, partially included.  IMPRESSION: 1. Comminuted displaced intertrochanteric right hip fracture extending to the base of femoral neck. There is subtrochanteric involvement involving the medial cortex. 2. Advanced osteoarthritis of the right hip. Postsurgical change with ghost tracks to the femoral neck.   Electronically Signed By: Rubye Oaks M.D. On: 08/30/2015 19:55          DG Chest Port 1 View (Final result) Result time: 08/30/15 18:28:51   Final result by Rad Results In Interface (08/30/15 18:28:51)   Narrative:   CLINICAL DATA: Initial encounter for preoperative respiratory evaluation for hip fracture.  EXAM: PORTABLE CHEST 1 VIEW  COMPARISON: 09/13/2014.  FINDINGS: The lungs are clear without focal pneumonia, edema, pneumothorax or pleural effusion. The cardio pericardial silhouette is enlarged. Left sided permanent pacemaker/ AICD remains in place. Telemetry leads overlie the chest. Bones are diffusely demineralized.  IMPRESSION: Stable exam. Cardiomegaly without acute cardiopulmonary findings in this patient with known right-sided aortic arch.          I personally reviewed the radiologic studies       ED Course:  Patient had IV access established and was given IV narcotic pain control. Initial plain film radiologic studies were read by the radiologist as equivocal. It does appear though to my reading that the patient does have a right hip fracture though we went ahead and did verify by CAT  scan evaluation. The fracture appears to be comminuted and exclusively in the intertrochanteric area. The findings were discussed with the on-call orthopedic surgeon who advised he would likely wound in the morning with planned surgery tomorrow. Patient's case was then discussed with the  hospitalist team for admission and pain control.    Assessment: Acute right hip fracture     Plan:  Inpatient management             Jennye Moccasin, MD 08/30/15 2057

## 2015-08-30 NOTE — ED Notes (Signed)
Pt arrives via ems, with unwitnessed fall outside today. Pt was c/o right hip pain (prev hx of right hip surgery). Pt denied LOC (is on Eliquis). Pt says she was leaning over to pick up a cat when she fell. 100 MCG fentanyl administered en route to the ED.

## 2015-08-30 NOTE — Progress Notes (Signed)
Dr. Clint Guy is on the unit and made pt. A no code per pt. Request.

## 2015-08-31 ENCOUNTER — Inpatient Hospital Stay: Payer: Medicare Other

## 2015-08-31 ENCOUNTER — Inpatient Hospital Stay: Payer: Medicare Other | Admitting: Registered Nurse

## 2015-08-31 ENCOUNTER — Encounter
Admission: EM | Disposition: A | Discharge status: Home Health | Payer: Self-pay | Source: Home / Self Care | Attending: Internal Medicine

## 2015-08-31 HISTORY — PX: FEMUR IM NAIL: SHX1597

## 2015-08-31 LAB — CBC
HEMATOCRIT: 31.3 % — AB (ref 35.0–47.0)
Hemoglobin: 10.8 g/dL — ABNORMAL LOW (ref 12.0–16.0)
MCH: 34.9 pg — ABNORMAL HIGH (ref 26.0–34.0)
MCHC: 34.6 g/dL (ref 32.0–36.0)
MCV: 100.9 fL — AB (ref 80.0–100.0)
Platelets: 120 10*3/uL — ABNORMAL LOW (ref 150–440)
RBC: 3.1 MIL/uL — ABNORMAL LOW (ref 3.80–5.20)
RDW: 16.1 % — AB (ref 11.5–14.5)
WBC: 7 10*3/uL (ref 3.6–11.0)

## 2015-08-31 LAB — BASIC METABOLIC PANEL
Anion gap: 6 (ref 5–15)
BUN: 13 mg/dL (ref 6–20)
CHLORIDE: 102 mmol/L (ref 101–111)
CO2: 33 mmol/L — AB (ref 22–32)
Calcium: 8.8 mg/dL — ABNORMAL LOW (ref 8.9–10.3)
Creatinine, Ser: 0.76 mg/dL (ref 0.44–1.00)
GFR calc Af Amer: >60 mL/min (ref >60)
GFR calc non Af Amer: >60 mL/min (ref >60)
GLUCOSE: 136 mg/dL — AB (ref 65–99)
POTASSIUM: 4 mmol/L (ref 3.5–5.1)
Sodium: 141 mmol/L (ref 135–145)

## 2015-08-31 SURGERY — INSERTION, INTRAMEDULLARY ROD, FEMUR
Anesthesia: General | Laterality: Right | Wound class: Clean

## 2015-08-31 MED ORDER — PHENYLEPHRINE HCL 10 MG/ML IJ SOLN
INTRAMUSCULAR | Status: DC | PRN
  Administered 2015-08-31 (×3): 100 ug via INTRAVENOUS

## 2015-08-31 MED ORDER — POLYETHYLENE GLYCOL 3350 17 G PO PACK: 17.0000 g | PACK | Freq: Every day | ORAL | Status: DC | PRN

## 2015-08-31 MED ORDER — MIDAZOLAM HCL 2 MG/2ML IJ SOLN
INTRAMUSCULAR | Status: AC
  Administered 2015-08-31: 1 mg via INTRAVENOUS
  Filled 2015-08-31: qty 2

## 2015-08-31 MED ORDER — ENOXAPARIN SODIUM 30 MG/0.3ML ~~LOC~~ SOLN: 30.0000 mg | Freq: Two times a day (BID) | SUBCUTANEOUS | Status: DC

## 2015-08-31 MED ORDER — NEOMYCIN-POLYMYXIN B GU 40-200000 IR SOLN
Status: DC | PRN
  Administered 2015-08-31: 2 mL

## 2015-08-31 MED ORDER — DOCUSATE SODIUM 100 MG PO CAPS
100.0000 mg | ORAL_CAPSULE | Freq: Two times a day (BID) | ORAL | Status: DC
  Administered 2015-08-31 – 2015-09-03 (×6): 100 mg via ORAL
  Filled 2015-08-31 (×9): qty 1

## 2015-08-31 MED ORDER — ACETAMINOPHEN 10 MG/ML IV SOLN
INTRAVENOUS | Status: DC | PRN
  Administered 2015-08-31: 1000 mg via INTRAVENOUS

## 2015-08-31 MED ORDER — PHENOL 1.4 % MT LIQD
1.0000 | OROMUCOSAL | Status: DC | PRN
Start: 2015-08-31 — End: 2015-09-06

## 2015-08-31 MED ORDER — FENTANYL CITRATE (PF) 100 MCG/2ML IJ SOLN
25.0000 ug | INTRAMUSCULAR | Status: AC | PRN
  Administered 2015-08-31 (×6): 25 ug via INTRAVENOUS

## 2015-08-31 MED ORDER — ROCURONIUM BROMIDE 100 MG/10ML IV SOLN
INTRAVENOUS | Status: DC | PRN
  Administered 2015-08-31: 10 mg via INTRAVENOUS
  Administered 2015-08-31: 40 mg via INTRAVENOUS

## 2015-08-31 MED ORDER — ONDANSETRON HCL 4 MG PO TABS: 4.0000 mg | ORAL_TABLET | Freq: Four times a day (QID) | ORAL | Status: DC | PRN

## 2015-08-31 MED ORDER — SODIUM CHLORIDE 0.9 % IV SOLN
75.0000 mL/h | INTRAVENOUS | Status: DC
  Administered 2015-09-01: 75 mL/h via INTRAVENOUS

## 2015-08-31 MED ORDER — HYDROMORPHONE HCL 1 MG/ML IJ SOLN
0.5000 mg | INTRAMUSCULAR | Status: DC | PRN
  Administered 2015-08-31 (×2): 0.5 mg via INTRAVENOUS
  Filled 2015-08-31 (×2): qty 1

## 2015-08-31 MED ORDER — PROPOFOL 10 MG/ML IV BOLUS: INTRAVENOUS | Status: DC | PRN

## 2015-08-31 MED ORDER — KETAMINE HCL 10 MG/ML IJ SOLN
INTRAMUSCULAR | Status: DC | PRN
  Administered 2015-08-31: 20 mg via INTRAVENOUS

## 2015-08-31 MED ORDER — FENTANYL CITRATE (PF) 100 MCG/2ML IJ SOLN
INTRAMUSCULAR | Status: AC
  Administered 2015-08-31: 25 ug via INTRAVENOUS
  Filled 2015-08-31: qty 2

## 2015-08-31 MED ORDER — OXYCODONE HCL 5 MG PO TABS
ORAL_TABLET | ORAL | Status: AC
  Administered 2015-08-31: 5 mg via ORAL
  Filled 2015-08-31: qty 1

## 2015-08-31 MED ORDER — MAGNESIUM CITRATE PO SOLN: 1.0000 | Freq: Once | ORAL | Status: DC | PRN

## 2015-08-31 MED ORDER — SODIUM CHLORIDE 0.9 % IJ SOLN
INTRAMUSCULAR | Status: AC
  Filled 2015-08-31: qty 3

## 2015-08-31 MED ORDER — FERROUS SULFATE 325 (65 FE) MG PO TABS
325.0000 mg | ORAL_TABLET | Freq: Three times a day (TID) | ORAL | Status: DC
  Administered 2015-09-01 – 2015-09-06 (×16): 325 mg via ORAL
  Filled 2015-08-31 (×16): qty 1

## 2015-08-31 MED ORDER — MEPERIDINE HCL 25 MG/ML IJ SOLN
INTRAMUSCULAR | Status: AC
  Administered 2015-08-31: 12.5 mg via INTRAVENOUS
  Filled 2015-08-31: qty 1

## 2015-08-31 MED ORDER — HYDROMORPHONE HCL 1 MG/ML IJ SOLN
1.0000 mg | INTRAMUSCULAR | Status: DC | PRN
  Administered 2015-08-31 – 2015-09-01 (×4): 1 mg via INTRAVENOUS
  Filled 2015-08-31 (×5): qty 1

## 2015-08-31 MED ORDER — ENOXAPARIN SODIUM 30 MG/0.3ML ~~LOC~~ SOLN
30.0000 mg | Freq: Two times a day (BID) | SUBCUTANEOUS | Status: DC
  Administered 2015-09-01: 30 mg via SUBCUTANEOUS
  Filled 2015-08-31: qty 0.3

## 2015-08-31 MED ORDER — ACETAMINOPHEN 650 MG RE SUPP: 650.0000 mg | Freq: Four times a day (QID) | RECTAL | Status: DC | PRN

## 2015-08-31 MED ORDER — ONDANSETRON HCL 4 MG/2ML IJ SOLN: 4.0000 mg | Freq: Four times a day (QID) | INTRAMUSCULAR | Status: DC | PRN

## 2015-08-31 MED ORDER — MENTHOL 3 MG MT LOZG: 1.0000 | LOZENGE | OROMUCOSAL | Status: DC | PRN

## 2015-08-31 MED ORDER — NEOMYCIN-POLYMYXIN B GU 40-200000 IR SOLN
Status: AC
  Filled 2015-08-31: qty 2

## 2015-08-31 MED ORDER — NEOSTIGMINE METHYLSULFATE 10 MG/10ML IV SOLN
INTRAVENOUS | Status: DC | PRN
  Administered 2015-08-31: 4.5 mg via INTRAVENOUS

## 2015-08-31 MED ORDER — PROPOFOL 10 MG/ML IV BOLUS
INTRAVENOUS | Status: DC | PRN
  Administered 2015-08-31: 20 mg via INTRAVENOUS
  Administered 2015-08-31: 60 mg via INTRAVENOUS

## 2015-08-31 MED ORDER — GLYCOPYRROLATE 0.2 MG/ML IJ SOLN
INTRAMUSCULAR | Status: DC | PRN
Start: 2015-08-31 — End: 2015-08-31
  Administered 2015-08-31: .8 mg via INTRAVENOUS

## 2015-08-31 MED ORDER — OXYCODONE HCL 5 MG PO TABS
5.0000 mg | ORAL_TABLET | ORAL | Status: DC | PRN
  Administered 2015-08-31 – 2015-09-01 (×3): 5 mg via ORAL
  Filled 2015-08-31 (×4): qty 1

## 2015-08-31 MED ORDER — MEPERIDINE HCL 25 MG/ML IJ SOLN
12.5000 mg | INTRAMUSCULAR | Status: DC | PRN
Start: 2015-08-31 — End: 2015-08-31
  Administered 2015-08-31: 12.5 mg via INTRAVENOUS

## 2015-08-31 MED ORDER — BISACODYL 10 MG RE SUPP
10.0000 mg | Freq: Every day | RECTAL | Status: DC | PRN
  Administered 2015-09-02: 10 mg via RECTAL
  Filled 2015-08-31: qty 1

## 2015-08-31 MED ORDER — LACTATED RINGERS IV SOLN
INTRAVENOUS | Status: DC | PRN
  Administered 2015-08-31: 14:00:00 via INTRAVENOUS

## 2015-08-31 MED ORDER — MIDAZOLAM HCL 2 MG/2ML IJ SOLN
1.0000 mg | Freq: Once | INTRAMUSCULAR | Status: AC
  Administered 2015-08-31: 1 mg via INTRAVENOUS

## 2015-08-31 MED ORDER — ACETAMINOPHEN 10 MG/ML IV SOLN
INTRAVENOUS | Status: AC
  Filled 2015-08-31: qty 100

## 2015-08-31 MED ORDER — ALUM & MAG HYDROXIDE-SIMETH 200-200-20 MG/5ML PO SUSP: 30.0000 mL | ORAL | Status: DC | PRN

## 2015-08-31 MED ORDER — ONDANSETRON HCL 4 MG/2ML IJ SOLN: 4.0000 mg | Freq: Once | INTRAMUSCULAR | Status: DC | PRN

## 2015-08-31 MED ORDER — ACETAMINOPHEN 325 MG PO TABS
650.0000 mg | ORAL_TABLET | Freq: Four times a day (QID) | ORAL | Status: DC | PRN
  Administered 2015-09-03: 650 mg via ORAL
  Filled 2015-08-31: qty 2

## 2015-08-31 MED ORDER — CEFAZOLIN SODIUM-DEXTROSE 2-3 GM-% IV SOLR
2.0000 g | Freq: Four times a day (QID) | INTRAVENOUS | Status: AC
  Administered 2015-08-31 (×2): 2 g via INTRAVENOUS
  Filled 2015-08-31 (×2): qty 50

## 2015-08-31 SURGICAL SUPPLY — 36 items
BIT DRILL 4.3MMS DISTAL GRDTED (BIT) ×1 IMPLANT
BNDG COHESIVE 6X5 TAN STRL LF (GAUZE/BANDAGES/DRESSINGS) ×6 IMPLANT
CANISTER SUCT 1200ML W/VALVE (MISCELLANEOUS) ×3 IMPLANT
DRAPE IMP U-DRAPE 54X76 (DRAPES) ×3 IMPLANT
DRAPE SURG 17X11 SM STRL (DRAPES) ×3 IMPLANT
DRAPE U-SHAPE 47X51 STRL (DRAPES) ×3 IMPLANT
DRILL 4.3MMS DISTAL GRADUATED (BIT) ×3
DRSG OPSITE POSTOP 3X4 (GAUZE/BANDAGES/DRESSINGS) ×3 IMPLANT
DRSG OPSITE POSTOP 4X14 (GAUZE/BANDAGES/DRESSINGS) ×6 IMPLANT
DURAPREP 26ML APPLICATOR (WOUND CARE) ×3 IMPLANT
GLOVE BIOGEL PI IND STRL 9 (GLOVE) ×1 IMPLANT
GLOVE BIOGEL PI INDICATOR 9 (GLOVE) ×2
GLOVE SURG 9.0 ORTHO LTXF (GLOVE) ×6 IMPLANT
GOWN STRL REUS TWL 2XL XL LVL4 (GOWN DISPOSABLE) ×3 IMPLANT
GOWN STRL REUS W/ TWL LRG LVL3 (GOWN DISPOSABLE) ×1 IMPLANT
GOWN STRL REUS W/TWL LRG LVL3 (GOWN DISPOSABLE) ×2
GUIDEPIN VERSANAIL DSP 3.2X444 ×6 IMPLANT
GUIDEWIRE BALL NOSE 80CM (WIRE) ×3 IMPLANT
HEMOVAC 400CC 10FR (MISCELLANEOUS) IMPLANT
HFN RH 130 DEG 9MM X 340MM (Nail) ×3 IMPLANT
KIT RM TURNOVER CYSTO AR (KITS) ×3 IMPLANT
MAT BLUE FLOOR 46X72 FLO (MISCELLANEOUS) ×3 IMPLANT
NS IRRIG 1000ML POUR BTL (IV SOLUTION) ×3 IMPLANT
PACK HIP COMPR (MISCELLANEOUS) ×3 IMPLANT
PAD GROUND ADULT SPLIT (MISCELLANEOUS) ×3 IMPLANT
SCREW BONE CORTICAL 5.0X42 (Screw) ×3 IMPLANT
SCREW LAG 10.5MMX105MM HFN (Screw) ×3 IMPLANT
SCREWDRIVER HEX TIP 3.5MM (MISCELLANEOUS) ×3 IMPLANT
STAPLER SKIN PROX 35W (STAPLE) ×3 IMPLANT
SUCTION FRAZIER TIP 10 FR DISP (SUCTIONS) IMPLANT
SUT VIC AB 2-0 CT1 27 (SUTURE) ×3
SUT VIC AB 2-0 CT1 TAPERPNT 27 (SUTURE) ×1 IMPLANT
SUT VICRYL 2 0 18  UND BR (SUTURE) ×2
SUT VICRYL 2 0 18 UND BR (SUTURE) ×1 IMPLANT
SUT VICRYL 2 TP 1 (SUTURE) ×3 IMPLANT
SYR 30ML LL (SYRINGE) ×3 IMPLANT

## 2015-08-31 NOTE — Progress Notes (Addendum)
Pt. Admitted from ED with right femur fracture after falling over her cat. Alert and oriented. 5lbs. Bucks traction applied to affected leg. Foley inserted with clear yellow urine return. TED hose applied to left leg and foot pumps put on. CHL bath completed. Ice pack to right hip. NPO. MRSA swab of nares negative. Pills whole with water. Dr. Margo Common to consult in the morning. Pt. Resting quietly. Will continue to monitor.

## 2015-08-31 NOTE — Transfer of Care (Signed)
Immediate Anesthesia Transfer of Care Note  Patient: [name] [name]  Procedure(s) Performed: Procedure(s): INTRAMEDULLARY (IM) NAIL FEMORAL (Right)  Patient Location: PACU  Anesthesia Type:General  Level of Consciousness: awake  Airway & Oxygen Therapy: Patient Spontanous Breathing and Patient connected to face mask oxygen  Post-op Assessment: Report given to RN and Post -op Vital signs reviewed and stable  Post vital signs: Reviewed and stable  Last Vitals:  Filed Vitals:   08/31/15 1538  BP: 118/75  Pulse: 34  Temp: 37.2 C  Resp: 24    Complications: No apparent anesthesia complications

## 2015-08-31 NOTE — Anesthesia Procedure Notes (Signed)
Procedure Name: Intubation Date/Time: 08/31/2015 2:15 PM Performed by: Henrietta Hoover Pre-anesthesia Checklist: Patient identified, Emergency Drugs available, Suction available, Patient being monitored and Timeout performed Patient Re-evaluated:Patient Re-evaluated prior to inductionOxygen Delivery Method: Circle system utilized Preoxygenation: Pre-oxygenation with 100% oxygen Intubation Type: IV induction Laryngoscope Size: Mac, 3, Miller and 2 Grade View: Grade II Tube type: Oral Tube size: 7.0 mm Number of attempts: 2 Placement Confirmation: ETT inserted through vocal cords under direct vision,  positive ETCO2 and breath sounds checked- equal and bilateral Secured at: 20 cm Tube secured with: Tape

## 2015-08-31 NOTE — Progress Notes (Signed)
Subjective:  POST-OP CHECK:  Patient reports pain as mild.  Patient seen in her hospital bed with the family in the room. Patient is still mildly confused after anesthesia. She has no complaints of pain at this time.  Objective:   VITALS:   Filed Vitals:   08/31/15 1623 08/31/15 1629 08/31/15 1645 08/31/15 1717  BP: 110/54  94/47 106/46  Pulse: 65 65 52 64  Temp:   97.8 F (36.6 C) 97.9 F (36.6 C)  TempSrc:   Axillary Axillary  Resp: 15 15 16 16   Height:      Weight:      SpO2: 98% 97% 95% 98%    PHYSICAL EXAM:  Right hip/lower extremity: Patient's dressing is clean dry and intact. Her thigh compartments are soft and compressible. Her lower leg compartments are soft and compressible. She has TED stockings and foot pumps in place. She has intact sensation light touch in palpable pedal pulses. She has intact motor function in the right lower extremity. She can flex and extend her toes and dorsiflex and plantarflex her ankle.   LABS  Results for orders placed or performed during the hospital encounter of 08/30/15 (from the past 24 hour(s))  ABO/Rh     Status: None   Collection Time: 08/30/15  5:53 PM  Result Value Ref Range   ABO/RH(D) A POS   Urinalysis complete, with microscopic ([facility] only)     Status: Abnormal   Collection Time: 08/30/15  8:59 PM  Result Value Ref Range   Color, Urine YELLOW (A) YELLOW   APPearance CLEAR (A) CLEAR   Glucose, UA NEGATIVE NEGATIVE mg/dL   Bilirubin Urine NEGATIVE NEGATIVE   Ketones, ur NEGATIVE NEGATIVE mg/dL   Specific Gravity, Urine 1.016 1.005 - 1.030   Hgb urine dipstick 1+ (A) NEGATIVE   pH 5.0 5.0 - 8.0   Protein, ur NEGATIVE NEGATIVE mg/dL   Nitrite NEGATIVE NEGATIVE   Leukocytes, UA NEGATIVE NEGATIVE   RBC / HPF 0-5 0 - 5 RBC/hpf   WBC, UA 0-5 0 - 5 WBC/hpf   Bacteria, UA MANY (A) NONE SEEN   Squamous Epithelial / LPF 0-5 (A) NONE SEEN   Mucous PRESENT    Hyaline Casts, UA PRESENT   Surgical pcr screen     Status:  Abnormal   Collection Time: 08/30/15  9:45 PM  Result Value Ref Range   MRSA, PCR NEGATIVE NEGATIVE   Staphylococcus aureus POSITIVE (A) NEGATIVE  Albumin     Status: None   Collection Time: 08/30/15  9:52 PM  Result Value Ref Range   Albumin 4.1 3.5 - 5.0 g/dL  Basic metabolic panel     Status: Abnormal   Collection Time: 08/31/15  5:35 AM  Result Value Ref Range   Sodium 141 135 - 145 mmol/L   Potassium 4.0 3.5 - 5.1 mmol/L   Chloride 102 101 - 111 mmol/L   CO2 33 (H) 22 - 32 mmol/L   Glucose, Bld 136 (H) 65 - 99 mg/dL   BUN 13 6 - 20 mg/dL   Creatinine, Ser 4.08 0.44 - 1.00 mg/dL   Calcium 8.8 (L) 8.9 - 10.3 mg/dL   GFR calc non Af Amer >60 >60 mL/min   GFR calc Af Amer >60 >60 mL/min   Anion gap 6 5 - 15  CBC     Status: Abnormal   Collection Time: 08/31/15  5:35 AM  Result Value Ref Range   WBC 7.0 3.6 - 11.0 K/uL   RBC  3.10 (L) 3.80 - 5.20 MIL/uL   Hemoglobin 10.8 (L) 12.0 - 16.0 g/dL   HCT 16.1 (L) 09.6 - 04.5 %   MCV 100.9 (H) 80.0 - 100.0 fL   MCH 34.9 (H) 26.0 - 34.0 pg   MCHC 34.6 32.0 - 36.0 g/dL   RDW 40.9 (H) 81.1 - 91.4 %   Platelets 120 (L) 150 - 440 K/uL    Ct Hip Right Wo Contrast  08/30/2015   CLINICAL DATA:  81 year old female post unwitnessed fall outside today, now with right hip pain. History of prior right hip surgery.  EXAM: CT OF THE RIGHT HIP WITHOUT CONTRAST  TECHNIQUE: Multidetector CT imaging of the right hip was performed according to the standard protocol. Multiplanar CT image reconstructions were also generated.  COMPARISON:  Radiographs earlier this day, concerning for intertrochanteric fracture.  FINDINGS: Comminuted fracture of the intertrochanteric right hip with displacement involving the greater and lesser trochanters. Fracture extends to the base of the femoral neck. There is subtrochanteric extension about the medial cortex. There ghost tracks with femoral neck from prior surgery. Severe osteoarthritis of the right hip with complete joint  space loss, subchondral cystic change in scleroses. Small intra-articular bodies seen inferior medially in the joint space. In cuboid acetabulum pubic rami are intact without additional fracture. Probable hematoma tracking in the medial upper thigh musculature, partially included.  IMPRESSION: 1. Comminuted displaced intertrochanteric right hip fracture extending to the base of femoral neck. There is subtrochanteric involvement involving the medial cortex. 2. Advanced osteoarthritis of the right hip. Postsurgical change with ghost tracks to the femoral neck.   Electronically Signed   By: Rubye Oaks M.D.   On: 08/30/2015 19:55   Dg Chest Port 1 View  08/30/2015   CLINICAL DATA:  Initial encounter for preoperative respiratory evaluation for hip fracture.  EXAM: PORTABLE CHEST 1 VIEW  COMPARISON:  09/13/2014.  FINDINGS: The lungs are clear without focal pneumonia, edema, pneumothorax or pleural effusion. The cardio pericardial silhouette is enlarged. Left sided permanent pacemaker/ AICD remains in place. Telemetry leads overlie the chest. Bones are diffusely demineralized.  IMPRESSION: Stable exam. Cardiomegaly without acute cardiopulmonary findings in this patient with known right-sided aortic arch.   Electronically Signed   By: Kennith Center M.D.   On: 08/30/2015 18:28   Dg C-arm 61-120 Min-no Report  08/31/2015   CLINICAL DATA: hip affixus   C-ARM 61-120 MINUTES  Fluoroscopy was utilized by the requesting physician.  No radiographic  interpretation.    Dg Hip Unilat  With Pelvis 2-3 Views Right  08/30/2015   CLINICAL DATA:  Fall. Remote history of RIGHT hip fracture. RIGHT hip pain. Initial encounter.  EXAM: DG HIP (WITH OR WITHOUT PELVIS) 2-3V RIGHT  COMPARISON:  None.  FINDINGS: There is no comparison available to assess for interval changes. Lucency extends through the intertrochanteric RIGHT femur, suspicious for an acute intertrochanteric femur fracture. Osteopenia is present. Severe RIGHT hip  osteoarthritis is present. Given the history, the deformity in the RIGHT femoral neck could be chronic. Noncontrast CT should be considered for further assessment in this patient with acute on chronic injury.  The obturator rings appear intact.  IMPRESSION: Deformity of the proximal RIGHT femur is suspicious for an acute intertrochanteric femur fracture however given the underlying healed fracture, a noncontrast CT of the RIGHT hip is recommended for further assessment.   Electronically Signed   By: Andreas Newport M.D.   On: 08/30/2015 18:27   Dg Femur, Min 2 Views  Right  08/31/2015   CLINICAL DATA:  Open reduction internal fixation for fracture  EXAM: RIGHT FEMUR 2 VIEWS; DG C-ARM 61-120 MIN-NO REPORT  COMPARISON:  CT right hip region August 30, 2015  FLUOROSCOPY TIME:  0 minutes 52 seconds; 4 submitted images  FINDINGS: There is screw and rod fixation through a comminuted intertrochanteric femur fracture. Alignment is near anatomic at the fracture site. The tip of the screws in the proximal femoral head region. No dislocation. No erosive change.  IMPRESSION: Alignment at the intertrochanteric femur fracture site is near anatomic. No new fracture. No dislocation.   Electronically Signed   By: Bretta Bang III M.D.   On: 08/31/2015 15:26   Dg Femur Port, Min 2 Views Right  08/31/2015   CLINICAL DATA:  Hip fracture requiring operative repair  EXAM: RIGHT FEMUR PORTABLE 1 VIEW  COMPARISON:  August 31, 2015  FINDINGS: A right femoral rod is identified through intertrochanteric fracture without malalignment. Postoperative changes with soft tissue air and skin staples are identified in the right hip.  IMPRESSION: Postoperative changes as described.   Electronically Signed   By: Sherian Rein M.D.   On: 08/31/2015 16:31    Assessment/Plan: Day of Surgery   Principal Problem:   Intertrochanteric fracture of right femur  Patient is stable postop. I reviewed the postoperative x-rays which show the  fracture is well reduced and hardware in good position. Patient will labs rechecked in the morning. She'll begin physical and occupational therapy. Foley catheter will be removed tomorrow. She'll complete 24 hours antibiotics. Patient will begin on Lovenox tomorrow for DVT prophylaxis will continue TED stockings and foot pumps.    Juanell Fairly , MD 08/31/2015, 5:52 PM

## 2015-08-31 NOTE — Progress Notes (Signed)
Pharmacy Note - Enoxaparin  Pharmacy consulted to dose enoxaparin for VTE prophylaxis in this 81 year old female patient s/p orthopedic surgery.  Estimated Creatinine Clearance: 40.7 mL/min (by C-G formula based on Cr of 0.76).  Wt: 68kg  Current orders for enoxparin 30mg  SQ Q12H appropriate for renal function, weight, and indication. Will continue.  Garlon Hatchet, PharmD Clinical Pharmacist  08/31/2015 6:16 PM

## 2015-08-31 NOTE — Progress Notes (Signed)
[facility] [place] at [facility]   PATIENT NAME: [name] [name]    MR#:  [id number]  DATE OF BIRTH:  Apr 28, 1921  SUBJECTIVE:  CHIEF COMPLAINT:   Chief Complaint  Patient presents with  . Fall   presented after a mechanical fall. Bladder on her right side and has right hip fracture. No other concerns.  REVIEW OF SYSTEMS:    Review of Systems  Constitutional: Positive for malaise/fatigue. Negative for fever and chills.  HENT: Negative for sore throat.   Eyes: Negative for blurred vision, double vision and pain.  Respiratory: Negative for cough, hemoptysis, shortness of breath and wheezing.   Cardiovascular: Negative for chest pain, palpitations, orthopnea and leg swelling.  Gastrointestinal: Negative for heartburn, nausea, vomiting, abdominal pain, diarrhea and constipation.  Genitourinary: Negative for dysuria and hematuria.  Musculoskeletal: Positive for back pain and joint pain.  Skin: Negative for rash.  Neurological: Negative for sensory change, speech change, focal weakness and headaches.  Endo/Heme/Allergies: Does not bruise/bleed easily.  Psychiatric/Behavioral: Positive for memory loss. Negative for depression. The patient is not nervous/anxious.       DRUG ALLERGIES:   Allergies  Allergen Reactions  . Codeine Nausea And Vomiting  . Morphine Nausea And Vomiting  . Propoxyphene N-Acetaminophen Nausea And Vomiting    VITALS:  Blood pressure 115/48, pulse 67, temperature 98.3 F (36.8 C), temperature source Oral, resp. rate 18, height  (1.575 m), weight 68.312 kg (150 lb 9.6 oz), SpO2 100 %.  PHYSICAL EXAMINATION:   Physical Exam  GENERAL:  81 y.o.-year-old patient lying in the bed with no acute distress.  EYES: Pupils equal, round, reactive to light and accommodation. No scleral icterus. Extraocular muscles intact.  HEENT: Head atraumatic, normocephalic. Oropharynx and nasopharynx clear.  NECK:  Supple, no jugular venous  distention. No thyroid enlargement, no tenderness.  LUNGS: Normal breath sounds bilaterally, no wheezing, rales, rhonchi. No use of accessory muscles of respiration.  CARDIOVASCULAR: S1, S2 normal. No murmurs, rubs, or gallops.  ABDOMEN: Soft, nontender, nondistended. Bowel sounds present. No organomegaly or mass.  EXTREMITIES: No cyanosis, clubbing or edema b/l.   Right hip tenderness NEUROLOGIC: Cranial nerves II through XII are intact. No focal Motor or sensory deficits b/l.   PSYCHIATRIC: The patient is alert and oriented x 3.  SKIN: No obvious rash, lesion, or ulcer.    LABORATORY PANEL:   CBC  Recent Labs Lab 08/31/15 0535  WBC 7.0  HGB 10.8*  HCT 31.3*  PLT 120*   ------------------------------------------------------------------------------------------------------------------  Chemistries   Recent Labs Lab 08/31/15 0535  NA 141  K 4.0  CL 102  CO2 33*  GLUCOSE 136*  BUN 13  CREATININE 0.76  CALCIUM 8.8*   ------------------------------------------------------------------------------------------------------------------  Cardiac Enzymes No results for input(s): TROPONINI in the last 168 hours. ------------------------------------------------------------------------------------------------------------------  RADIOLOGY:  Ct Hip Right Wo Contrast  08/30/2015   CLINICAL DATA:  81 year old female post unwitnessed fall outside today, now with right hip pain. History of prior right hip surgery.  EXAM: CT OF THE RIGHT HIP WITHOUT CONTRAST  TECHNIQUE: Multidetector CT imaging of the right hip was performed according to the standard protocol. Multiplanar CT image reconstructions were also generated.  COMPARISON:  Radiographs earlier this day, concerning for intertrochanteric fracture.  FINDINGS: Comminuted fracture of the intertrochanteric right hip with displacement involving the greater and lesser trochanters. Fracture extends to the base of the femoral neck. There is  subtrochanteric extension about the medial cortex. There ghost tracks with femoral neck from  prior surgery. Severe osteoarthritis of the right hip with complete joint space loss, subchondral cystic change in scleroses. Small intra-articular bodies seen inferior medially in the joint space. In cuboid acetabulum pubic rami are intact without additional fracture. Probable hematoma tracking in the medial upper thigh musculature, partially included.  IMPRESSION: 1. Comminuted displaced intertrochanteric right hip fracture extending to the base of femoral neck. There is subtrochanteric involvement involving the medial cortex. 2. Advanced osteoarthritis of the right hip. Postsurgical change with ghost tracks to the femoral neck.   Electronically Signed   By: Rubye Oaks M.D.   On: 08/30/2015 19:55   Dg Chest Port 1 View  08/30/2015   CLINICAL DATA:  Initial encounter for preoperative respiratory evaluation for hip fracture.  EXAM: PORTABLE CHEST 1 VIEW  COMPARISON:  09/13/2014.  FINDINGS: The lungs are clear without focal pneumonia, edema, pneumothorax or pleural effusion. The cardio pericardial silhouette is enlarged. Left sided permanent pacemaker/ AICD remains in place. Telemetry leads overlie the chest. Bones are diffusely demineralized.  IMPRESSION: Stable exam. Cardiomegaly without acute cardiopulmonary findings in this patient with known right-sided aortic arch.   Electronically Signed   By: Kennith Center M.D.   On: 08/30/2015 18:28   Dg Hip Unilat  With Pelvis 2-3 Views Right  08/30/2015   CLINICAL DATA:  Fall. Remote history of RIGHT hip fracture. RIGHT hip pain. Initial encounter.  EXAM: DG HIP (WITH OR WITHOUT PELVIS) 2-3V RIGHT  COMPARISON:  None.  FINDINGS: There is no comparison available to assess for interval changes. Lucency extends through the intertrochanteric RIGHT femur, suspicious for an acute intertrochanteric femur fracture. Osteopenia is present. Severe RIGHT hip osteoarthritis is  present. Given the history, the deformity in the RIGHT femoral neck could be chronic. Noncontrast CT should be considered for further assessment in this patient with acute on chronic injury.  The obturator rings appear intact.  IMPRESSION: Deformity of the proximal RIGHT femur is suspicious for an acute intertrochanteric femur fracture however given the underlying healed fracture, a noncontrast CT of the RIGHT hip is recommended for further assessment.   Electronically Signed   By: Andreas Newport M.D.   On: 08/30/2015 18:27     ASSESSMENT AND PLAN:   81 year old Caucasian female history of coronary artery disease paroxysmal atrial fibrillation presenting after mechanical fall  1. Right intertrochanteric fracture Eliquis held. Restart after surgery. Orthopedics unsalted and surgery is later today. Pain control. Physical therapy after surgery. Incentive Spirometer. Monitor for any complications from surgery. We will repeat hemoglobin tomorrow morning.  2. GERD without Esophagitis: PPI therapy 3. Paroxysmal atrial fibrillation: Anticoagulation as stated above 4. Coronary artery disease continue with beta blockade, statin therapy 5. Venous embolism prophylactic SCDs  All the records are reviewed and case discussed with Care Management/Social Workerr. Management plans discussed with the patient, family and they are in agreement.  CODE STATUS: DNR  DVT Prophylaxis: SCDs  TOTAL TIME TAKING CARE OF THIS PATIENT: 30 minutes.   POSSIBLE D/C IN 3-4 DAYS, DEPENDING ON CLINICAL CONDITION.   Milagros Loll R M.D on 08/31/2015 at 1:34 PM  Between 7am to 6pm - Pager - [phone]  After 6pm go to [email or web address] - password [email or web address] [facility]  [place]  Office  [phone]  CC: Primary care physician; Alva Garnet., MD    Note: This dictation was prepared with Dragon dictation along with smaller phrase technology. Any transcriptional errors that result from this process  are unintentional.

## 2015-08-31 NOTE — Anesthesia Preprocedure Evaluation (Signed)
Anesthesia Evaluation  Patient identified by MRN, date of birth, ID band Patient awake    Reviewed: Allergy & Precautions, H&P , NPO status , Patient's Chart, lab work & pertinent test results, reviewed documented beta blocker date and time   Airway Mallampati: II  TM Distance: >3 FB Neck ROM: full    Dental  (+) Teeth Intact   Pulmonary neg pulmonary ROS, Current Smoker, former smoker,    Pulmonary exam normal        Cardiovascular hypertension, + CAD  negative cardio ROS Normal cardiovascular exam+ dysrhythmias + pacemaker  Rhythm:regular Rate:Normal     Neuro/Psych negative neurological ROS  negative psych ROS   GI/Hepatic negative GI ROS, Neg liver ROS, GERD  ,  Endo/Other  negative endocrine ROS  Renal/GU negative Renal ROS  negative genitourinary   Musculoskeletal  (+) Arthritis ,   Abdominal   Peds  Hematology negative hematology ROS (+)   Anesthesia Other Findings   Reproductive/Obstetrics negative OB ROS                             Anesthesia Physical Anesthesia Plan  ASA: IV and emergent  Anesthesia Plan: General ETT   Post-op Pain Management:    Induction:   Airway Management Planned:   Additional Equipment:   Intra-op Plan:   Post-operative Plan:   Informed Consent: I have reviewed the patients History and Physical, chart, labs and discussed the procedure including the risks, benefits and alternatives for the proposed anesthesia with the patient or authorized representative who has indicated his/her understanding and acceptance.     Plan Discussed with: CRNA  Anesthesia Plan Comments:         Anesthesia Quick Evaluation

## 2015-08-31 NOTE — Op Note (Signed)
DATE OF SURGERY:  08/31/2015  TIME: 3:31 PM  PATIENT NAME:  [name] [name]  AGE: 81 y.o.  PRE-OPERATIVE DIAGNOSIS:  RIGHT INTERTROCHANTERIC HIP FRACTURE   POST-OPERATIVE DIAGNOSIS:  SAME  PROCEDURE:  INTRAMEDULLARY (IM) NAIL PLACEMENT FOR RIGHT INTERTROCHANTERIC HIP FRACTURE WITH SUBTROCHANTERIC EXTENSION  SURGEON:  Juanell Fairly  OPERATIVE IMPLANTS: Biomet long Affixus nail 9 x 340, 105 mm lag screw with a 42 mm distal interlocking screw  PREOPERATIVE INDICATIONS:  [name] [name] is a 81 y.o. year old who fell and suffered a hip fracture.  She was brought into the ER and then admitted and medically cleared for surgical intervention.  I recommended treatment of this fracture with intramedullary rod.  The risks, benefits and alternatives were discussed with the patient and their family.  The risks include but are not limited to infection, bleeding, nerve or blood vessel injury, malunion, nonunion, hardware prominence, hardware failure, change in leg lengths or lower extremity rotation need for further surgery including hardware removal with conversion to a total hip arthroplasty. Medical risks include but are not limited to DVT and pulmonary embolism, myocardial infarction, stroke, pneumonia, respiratory failure and death. The patient and their family understood these risks and wished to proceed with surgery.  OPERATIVE PROCEDURE:  The patient was brought to the operating room and placed in the supine position on the fracture table. General anesthesia was administered. A closed reduction was performed under C-arm guidance.Near-anatomic reduction was achieved. The fracture reduction was confirmed on both AP and lateral views. A time out was performed to verify the patient's name, date of birth, medical record number, correct site of surgery correct procedure to be performed. The timeout was also used to verify the patient received antibiotics and all appropriate instruments, implants and  radiographic studies were available in the room. Once all in attendance were in agreement, the case began. The patient was prepped and draped in a sterile fashion. She received preoperative antibiotics. Patient received 2 g of Kefzol.  An incision was made proximal to the greater trochanter in line with the femur. A guidewire was placed over the tip of the greater trochanter and advanced into the proximal femur to the level of the lesser trochanter. Confirmation of the drill pin position was made on AP and lateral C-arm images. The threaded guidepin was then overdrilled with the proximal femoral drill. The long ball-tipped guidewire was then inserted into the tip of the greater trochanter and advanced across the fracture site and down the femur. The position of this long ball-tipped guidewire was confirmed on AP and lateral C-arm images. Cannulated reamers were then advanced over the ball-tipped guidewire inserted down the femoral canal up to 11 mm. The depth gauge was used to measure the length of the nail to 340 mm. A size 9 diameter nail was used. The nail was then inserted into the proximal femur, across the fracture site and into the femoral shaft. Its position was confirmed on AP and lateral C-arm images.   Once the nail was completely seated, the attention was turned to placing the lag screw. The lag screw drill guide was placed through the guide arm for the Affixus nail. A guidepin was then placed through this drill guide and advanced through the lateral cortex of the femur, across the fracture site and into the femoral head achieving a tip apex distance of less than 25 mm.  The length of the drill pin was measured to be 105 mm, and the drill for the lag  screw was advanced through the lateral cortex, across the fracture site and up into the femoral head to the depth of 105 mm.. The lag screw was then advanced by hand into position across the fracture site into the femoral head. Its final position was  confirmed on AP and lateral C-arm images. Compression was applied as traction was carefully released. The fracture reduced very well. The set screw in the top of the intramedullary rod was tightened by hand using a screwdriver.  The Affixus guide arm was then removed. The attention was then turned to placement of the distal interlocking screw.  A freehand technique was used to place the distal interlocking screw. The screw was placed through the dynamic screw hole. A small stab incision was made at the lateral distal femur to allow for placement of the drill along the lateral cortex. Using a perfect circle technique the drill for the distal interlocking screw was advanced bicortically. Its position was confirmed on C-arm imaging. A depth gauge was used to measure the length of the screw. It was found to be 42 mm. This was advanced into position by hand. Final AP and lateral C-arm images of the distal end of the nail were taken.  The wounds were irrigated copiously and closed with 0 Vicryl for closure of the deep fascia and 0 and 2-0 Vicryl for the subcutaneous closure. The skin was approximated with staples. A dry sterile dressing was applied. I was scrubbed and present the entire case and all sharp and instrument counts were correct at the conclusion of the case. Patient was transferred to hospital bed and brought to PACU in stable condition. I spoke with the patient's family in the postop consultation room to let them know the case had gone without complication and the patient was stable in the recovery room.  She will be partial weightbearing and begin physical and occupational therapy tomorrow. The patient will started on medical DVT prophylaxis tomorrow.    Kathreen Devoid, MD

## 2015-08-31 NOTE — Progress Notes (Signed)
Pt. Seems to be somewhat confused this morning. VSS. 10lbs bucks traction on. Foley patent. NPO. 0.5mg  dilaudid administered. Pt. Resting quietly with daughter at the bedside. Will continue to monitor.

## 2015-08-31 NOTE — Consult Note (Signed)
ORTHOPAEDIC CONSULTATION  REQUESTING PHYSICIAN: Milagros Loll, MD  Chief Complaint: Right hip pain status post fall  HPI: [name] [name] is a 81 y.o. female who complains of  right hip pain after fall. Patient states that she was in her carport yesterday trying to retrieve a kitten that had escaped from the house. Her fall was not witnessed but is presumed she fell in the process of ushering the kitten to the house.  Past Medical History  Diagnosis Date  . Pacemaker     Biventricular, [employer]   . CAD in native artery   . LBBB (left bundle branch block)   . Chronic systolic heart failure   . Cardiomyopathy, primary   . Hypertension   . Hyperlipidemia   . Urticaria   . Urinary incontinence   . Osteoarthritis   . Spinal stenosis, lumbar    Past Surgical History  Procedure Laterality Date  . Pacemaker insertion      Biventricular, [employer], ICD   . Amputation      distal left 3rd and 4th fingers as child   . Appendectomy    . Cholecystectomy    . Tonsillectomy    . Kidney surgery      right kidney procdeure, 1970's   . Hip fracture surgery  02/2004     right hip    Social History   Social History  . Marital Status: Married    Spouse Name: N/A  . Number of Children: 5  . Years of Education: N/A   Occupational History  . retired     Horticulturist, commercial   Social History Main Topics  . Smoking status: Former Smoker    Quit date: 12/05/1955  . Smokeless tobacco: Never Used  . Alcohol Use: No  . Drug Use: No  . Sexual Activity: Not Asked   Other Topics Concern  . None   Social History Narrative   Has living will.  Discussed DNR- she requests this ("if the Lord decided to take me, just let me go"). Wouldn't want feeding tube if not cognitively aware. Asks that husband, than daughter Schotsi    Family History  Problem Relation Age of Onset  . Hypertension Other    Allergies  Allergen Reactions  . Codeine Nausea And Vomiting  . Morphine  Nausea And Vomiting  . Propoxyphene N-Acetaminophen Nausea And Vomiting   Prior to Admission medications   Medication Sig Start Date End Date Taking? Authorizing Provider  apixaban (ELIQUIS) 2.5 MG TABS tablet Take 1 tablet (2.5 mg total) by mouth 2 (two) times daily. 07/14/15  Yes Lewayne Bunting, MD  carvedilol (COREG) 3.125 MG tablet Take 1 tablet (3.125 mg total) by mouth 2 (two) times daily with a meal. 05/28/15  Yes Lewayne Bunting, MD  Cholecalciferol (VITAMIN D3) 1000 UNITS tablet Take 1,000 Units by mouth daily.     Yes Historical Provider, MD  digoxin (LANOXIN) 0.125 MG tablet Take 0.125 mg by mouth at bedtime.   Yes Historical Provider, MD  enalapril (VASOTEC) 5 MG tablet Take 1 tablet (5 mg total) by mouth 2 (two) times daily. 03/09/15  Yes Lewayne Bunting, MD  esomeprazole (NEXIUM) 20 MG capsule Take 20 mg by mouth daily.     Yes Historical Provider, MD  ezetimibe-simvastatin (VYTORIN) 10-40 MG per tablet Take 1 tablet by mouth at bedtime. Patient taking differently: Take 1 tablet by mouth every evening.  05/11/11  Yes Gaylord Shih, MD  furosemide (LASIX) 80  MG tablet Take 1 tablet (80 mg total) by mouth daily. 07/28/14  Yes Lewayne Bunting, MD  isosorbide mononitrate (IMDUR) 30 MG 24 hr tablet Take 30 mg by mouth at bedtime.   Yes Historical Provider, MD  latanoprost (XALATAN) 0.005 % ophthalmic solution Place 1 drop into both eyes at bedtime.    Yes Historical Provider, MD  nitroGLYCERIN (NITROSTAT) 0.4 MG SL tablet Place 1 tablet (0.4 mg total) under the tongue every 5 (five) minutes as needed for chest pain. 06/17/13  Yes Beatrice Lecher, PA-C  oxyCODONE-acetaminophen (PERCOCET/ROXICET) 5-325 MG per tablet Take 1 tablet by mouth 3 (three) times daily.    Yes Historical Provider, MD  potassium chloride SA (K-DUR,KLOR-CON) 20 MEQ tablet Take 1 tablet (20 mEq total) by mouth daily. 07/28/14  Yes Lewayne Bunting, MD  promethazine (PHENERGAN) 25 MG tablet Take 25 mg by mouth every 6 (six)  hours as needed for nausea or vomiting.   Yes Historical Provider, MD  vitamin B-12 (CYANOCOBALAMIN) 1000 MCG tablet Take 1,000 mcg by mouth 2 (two) times daily.   Yes Historical Provider, MD   Ct Hip Right Wo Contrast  08/30/2015   CLINICAL DATA:  81 year old female post unwitnessed fall outside today, now with right hip pain. History of prior right hip surgery.  EXAM: CT OF THE RIGHT HIP WITHOUT CONTRAST  TECHNIQUE: Multidetector CT imaging of the right hip was performed according to the standard protocol. Multiplanar CT image reconstructions were also generated.  COMPARISON:  Radiographs earlier this day, concerning for intertrochanteric fracture.  FINDINGS: Comminuted fracture of the intertrochanteric right hip with displacement involving the greater and lesser trochanters. Fracture extends to the base of the femoral neck. There is subtrochanteric extension about the medial cortex. There ghost tracks with femoral neck from prior surgery. Severe osteoarthritis of the right hip with complete joint space loss, subchondral cystic change in scleroses. Small intra-articular bodies seen inferior medially in the joint space. In cuboid acetabulum pubic rami are intact without additional fracture. Probable hematoma tracking in the medial upper thigh musculature, partially included.  IMPRESSION: 1. Comminuted displaced intertrochanteric right hip fracture extending to the base of femoral neck. There is subtrochanteric involvement involving the medial cortex. 2. Advanced osteoarthritis of the right hip. Postsurgical change with ghost tracks to the femoral neck.   Electronically Signed   By: Rubye Oaks M.D.   On: 08/30/2015 19:55   Dg Chest Port 1 View  08/30/2015   CLINICAL DATA:  Initial encounter for preoperative respiratory evaluation for hip fracture.  EXAM: PORTABLE CHEST 1 VIEW  COMPARISON:  09/13/2014.  FINDINGS: The lungs are clear without focal pneumonia, edema, pneumothorax or pleural effusion. The  cardio pericardial silhouette is enlarged. Left sided permanent pacemaker/ AICD remains in place. Telemetry leads overlie the chest. Bones are diffusely demineralized.  IMPRESSION: Stable exam. Cardiomegaly without acute cardiopulmonary findings in this patient with known right-sided aortic arch.   Electronically Signed   By: Kennith Center M.D.   On: 08/30/2015 18:28   Dg Hip Unilat  With Pelvis 2-3 Views Right  08/30/2015   CLINICAL DATA:  Fall. Remote history of RIGHT hip fracture. RIGHT hip pain. Initial encounter.  EXAM: DG HIP (WITH OR WITHOUT PELVIS) 2-3V RIGHT  COMPARISON:  None.  FINDINGS: There is no comparison available to assess for interval changes. Lucency extends through the intertrochanteric RIGHT femur, suspicious for an acute intertrochanteric femur fracture. Osteopenia is present. Severe RIGHT hip osteoarthritis is present. Given the history, the deformity  in the RIGHT femoral neck could be chronic. Noncontrast CT should be considered for further assessment in this patient with acute on chronic injury.  The obturator rings appear intact.  IMPRESSION: Deformity of the proximal RIGHT femur is suspicious for an acute intertrochanteric femur fracture however given the underlying healed fracture, a noncontrast CT of the RIGHT hip is recommended for further assessment.   Electronically Signed   By: Andreas Newport M.D.   On: 08/30/2015 18:27    Positive ROS: All other systems have been reviewed and were otherwise negative with the exception of those mentioned in the HPI and as above.  Physical Exam: General: Alert, no acute distress  MUSCULOSKELETAL: Right lower extremity: Skin overlying the right hip is intact. Hip compartments are soft and compressible. There is no erythema or ecchymosis. She has intact sensation light touch in palpable pedal pulses distally. She can flex and extend her toes. She is in Buck's traction. Her leg compartments are also soft and compressible. The right lower  extremity is externally rotated and shortened.  Assessment: Right intertrochanteric hip fracture  Plan: The patient was seen in her hospital room today. Her daughter is at the bedside. I explained to them about the patient's injury and recommended intramedullary rod fixation. I used patient's white board in her room to diagram the injury and the proposed surgery. The patient has had a previous hip fracture treated with "pins" according to the patient. This was many years ago. The pins has since been removed. The x-ray shows pin tracks however. Patient is taking Eliquis 2.5 mg twice a day. She has an off the medication for 24 hours. I'm working with anesthesia to determine the appropriate surgical time for her. She understands that if she is unable to have her surgery today, my partner, Dr. Hyacinth Meeker, will need to take over her care.  The risks, benefits and alternatives were discussed with the patient and their family.  The risks include but are not limited to infection, bleeding, nerve or blood vessel injury, malunion, nonunion, hardware prominence, hardware failure, change in leg lengths or lower extremity rotation need for further surgery including hardware removal with conversion to a total hip arthroplasty. Medical risks include but are not limited to DVT and pulmonary embolism, myocardial infarction, stroke, pneumonia, respiratory failure and death. The patient and their family understood these risks and wished to proceed with surgery.   Juanell Fairly, MD    08/31/2015 8:41 AM

## 2015-08-31 NOTE — Progress Notes (Signed)
Patient off unit to OR.  Prior to leaving for OR patient was complaining of severe pain.  IV dilaudid 0.5 mg would cover the patient for approximately 2.5 hours and patient would begin to complain of severe pain.  Patients family present at bedside.

## 2015-09-01 ENCOUNTER — Encounter: Payer: Self-pay | Admitting: Orthopedic Surgery

## 2015-09-01 LAB — BASIC METABOLIC PANEL
Anion gap: 5 (ref 5–15)
BUN: 10 mg/dL (ref 6–20)
CO2: 30 mmol/L (ref 22–32)
CREATININE: 0.62 mg/dL (ref 0.44–1.00)
Calcium: 8.3 mg/dL — ABNORMAL LOW (ref 8.9–10.3)
Chloride: 104 mmol/L (ref 101–111)
GFR calc non Af Amer: >60 mL/min (ref >60)
Glucose, Bld: 120 mg/dL — ABNORMAL HIGH (ref 65–99)
Potassium: 3.9 mmol/L (ref 3.5–5.1)
Sodium: 139 mmol/L (ref 135–145)

## 2015-09-01 LAB — CBC
HEMATOCRIT: 24 % — AB (ref 35.0–47.0)
HEMATOCRIT: 28.1 % — AB (ref 35.0–47.0)
HEMOGLOBIN: 9.4 g/dL — AB (ref 12.0–16.0)
Hemoglobin: 8.3 g/dL — ABNORMAL LOW (ref 12.0–16.0)
MCH: 37.1 pg — AB (ref 26.0–34.0)
MCH: 34.2 pg — AB (ref 26.0–34.0)
MCHC: 34.5 g/dL (ref 32.0–36.0)
MCHC: 33.4 g/dL (ref 32.0–36.0)
MCV: 107.7 fL — ABNORMAL HIGH (ref 80.0–100.0)
MCV: 102.3 fL — ABNORMAL HIGH (ref 80.0–100.0)
Platelets: 96 10*3/uL — ABNORMAL LOW (ref 150–440)
Platelets: 104 10*3/uL — ABNORMAL LOW (ref 150–440)
RBC: 2.23 MIL/uL — ABNORMAL LOW (ref 3.80–5.20)
RBC: 2.75 MIL/uL — AB (ref 3.80–5.20)
RDW: 15.9 % — AB (ref 11.5–14.5)
RDW: 15.7 % — ABNORMAL HIGH (ref 11.5–14.5)
WBC: 7.8 10*3/uL (ref 3.6–11.0)
WBC: 10.1 10*3/uL (ref 3.6–11.0)

## 2015-09-01 LAB — VITAMIN D 25 HYDROXY (VIT D DEFICIENCY, FRACTURES): VIT D 25 HYDROXY: 52.2 ng/mL (ref 30.0–100.0)

## 2015-09-01 MED ORDER — OXYCODONE HCL 5 MG PO TABS
5.0000 mg | ORAL_TABLET | ORAL | Status: DC | PRN
  Administered 2015-09-01 – 2015-09-03 (×5): 10 mg via ORAL
  Administered 2015-09-03 (×2): 5 mg via ORAL
  Administered 2015-09-03 – 2015-09-04 (×2): 10 mg via ORAL
  Administered 2015-09-04 (×4): 5 mg via ORAL
  Administered 2015-09-05 – 2015-09-06 (×5): 10 mg via ORAL
  Filled 2015-09-01 (×10): qty 2
  Filled 2015-09-01 (×3): qty 1
  Filled 2015-09-01 (×2): qty 2
  Filled 2015-09-01: qty 1
  Filled 2015-09-01: qty 2
  Filled 2015-09-01: qty 1

## 2015-09-01 MED ORDER — OXYCODONE-ACETAMINOPHEN 5-325 MG PO TABS
1.0000 | ORAL_TABLET | Freq: Three times a day (TID) | ORAL | Status: DC
  Administered 2015-09-01: 1 via ORAL
  Filled 2015-09-01: qty 1

## 2015-09-01 MED ORDER — FUROSEMIDE 40 MG PO TABS
80.0000 mg | ORAL_TABLET | Freq: Every day | ORAL | Status: DC
  Administered 2015-09-01 – 2015-09-06 (×5): 80 mg via ORAL
  Filled 2015-09-01 (×6): qty 2

## 2015-09-01 MED ORDER — SODIUM CHLORIDE 0.9 % IV SOLN
INTRAVENOUS | Status: DC
  Administered 2015-09-01: 19:00:00 via INTRAVENOUS

## 2015-09-01 MED ORDER — APIXABAN 2.5 MG PO TABS
2.5000 mg | ORAL_TABLET | Freq: Two times a day (BID) | ORAL | Status: DC
  Administered 2015-09-01: 2.5 mg via ORAL
  Filled 2015-09-01 (×4): qty 1

## 2015-09-01 NOTE — Evaluation (Signed)
Occupational Therapy Evaluation Patient Details Name: [name] [name] MRN: [id number] DOB: 04-14-1921 Today's Date: 09/01/2015    History of Present Illness Patient is a 81 y/o female that presents with R femur fx and femoral rod placement.    Clinical Impression   This patient is a 81 year old female who came to [facility] after a fall suffering a L femur  fracture. She received an open reduction with internal fixation rod placement repair. She lives in a home with daughter and son in [place]. She had been  with basic activities of daily living and functional mobility. She now requires significant assist. She would benefit from Occupational Therapy for ADL/functional mobility training while .staying within weight bearing limits.     Follow Up Recommendations  SNF    Equipment Recommendations       Recommendations for Other Services       Precautions / Restrictions Precautions Precautions: Fall Restrictions Weight Bearing Restrictions: Yes RLE Weight Bearing: Partial weight bearing Other Position/Activity Restrictions: utilized R knee immobilizer as her knee extensors are weak and significant pain noted with knee flexion      Mobility Bed Mobility  Transfers            Balance                            ADL                                         General ADL Comments: Patient had been independent with basic ADL of dressing, bathing and toileting. She now needs max assist for activities of daily living. Practiced with hip kit techniques for lower body dressing illustrating it's use with hand over hand assist.     Vision     Perception     Praxis      Pertinent Vitals/Pain      Hand Dominance     Extremity/Trunk Assessment Upper Extremity Assessment Upper Extremity Assessment: Overall WFL for tasks assessed   Lower Extremity Assessment Lower Extremity Assessment: Defer to PT evaluation        Communication Communication Communication: No difficulties   Cognition Arousal/Alertness: Awake/alert                      General Comments       Exercises       Shoulder Instructions      Home Living Family/patient expects to be discharged to:: Private residence Living Arrangements: Spouse/significant other;Children Available Help at Discharge: Family Type of Home: House Home Access: Stairs to enter Technical brewer of Steps: 5   Home Layout: One level               Home Equipment: Environmental consultant - 2 wheels;Bedside commode;Shower seat;Hospital bed          Prior Functioning/Environment Level of Independence: Independent with assistive device(s)        Comments: Patient intermittently used RW or cane. Maintained an active lifestyle.     OT Diagnosis: Acute pain   OT Problem List: Decreased strength;Decreased range of motion;Decreased activity tolerance;Impaired balance (sitting and/or standing);Pain   OT Treatment/Interventions: Self-care/ADL training    OT Goals(Current goals can be found in the care plan section) Acute Rehab OT Goals Patient Stated Goal: To return home  Time For Goal Achievement:  09/15/15 Potential to Achieve Goals: Good  OT Frequency: Min 1X/week   Barriers to D/C:            Co-evaluation              End of Session Equipment Utilized During Treatment:  (Hip kit)  Activity Tolerance:   Patient left:  (with physical therapy)   Time: 6838-7065 OT Time Calculation (min): 21 min Charges:  OT General Charges $OT Visit: 1 Procedure OT Evaluation $Initial OT Evaluation Tier I: 1 Procedure OT Treatments $Self Care/Home Management : 8-22 mins G-Codes:    Myrene Galas, MS/OTR/L  09/01/2015, 4:26 PM

## 2015-09-01 NOTE — Care Management Note (Signed)
Case Management Note  Patient Details  Name: [name] [name] MRN: [id number] Date of Birth: 1921/05/18  Subjective/Objective:   S/P right femur fracture. POD 1. PT pending. Met with patient and daughter at bedside. Both prefer patient go home to have home health PT. She lives with her husband, child and son in [place].  She states she has been through this before. She has a hospital bed, wheelchair, walker and BSC. [facility] for [facility]. [facility] to [place] [facility].  PT pending.                 Action/Plan: Following progression  Expected Discharge Date:  09/03/15               Expected Discharge Plan:     In-House Referral:     Discharge planning Services  CM Consult  Post Acute Care Choice:  Home Health Choice offered to:  Patient, Adult Children  DME Arranged:    DME Agency:     HH Arranged:  PT [place]:  [place]  Status of Service:  In process, will continue to follow  Medicare Important Message Given:    Date Medicare IM Given:    Medicare IM give by:    Date Additional Medicare IM Given:    Additional Medicare Important Message give by:     If discussed at [place] of Stay Meetings, dates discussed:    Additional Comments:  Jolly Mango, RN 09/01/2015, 10:14 AM

## 2015-09-01 NOTE — Progress Notes (Signed)
MD Martha Clan notified of pts BP of 95/47.  No new orders received. Will continue to monitor.

## 2015-09-01 NOTE — Progress Notes (Signed)
MD [place] notified of pts BP of 95/47. Per MD orders restart NS at 50 ml for 8 hrs, and d/c IV Dilaudid. Will continue to monitor.

## 2015-09-01 NOTE — Evaluation (Signed)
Physical Therapy Evaluation Patient Details Name: [name] [name] MRN: [id number] DOB: 1921-06-03 Today's Date: 09/01/2015   History of Present Illness  Patient is a 81 y/o female that presents with R femur fx and femoral rod placement.   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 Today she presents with significant pain initially with movement, however she tolerates better after IV pain medication provided by RN. Patient requires significant assistance for bed mobility, transfers, and ambulation secondary to pain and weakness. Patient donned with R knee immobilizer as knee flexion is very painful for her and she has limited quadricep control secondary to pain. Patient did not buckle, though she took very small slow steps to complete bed to chair transfer. PT discussed rehab with family, however daughter would prefer to bring patient home with her. Patient will continue to benefit from skilled PT services to address her mobility deficits.     Follow Up Recommendations SNF    Equipment Recommendations       Recommendations for Other Services       Precautions / Restrictions Precautions Precautions: Fall Restrictions Weight Bearing Restrictions: Yes RLE Weight Bearing: Partial weight bearing Other Position/Activity Restrictions: utilized R knee immobilizer as her knee extensors are weak and significant pain noted with knee flexion      Mobility  Bed Mobility Overal bed mobility: Needs Assistance;+2 for physical assistance Bed Mobility: Supine to Sit     Supine to sit: Mod assist     General bed mobility comments: Patient has appropriate torso control, however distracted with pain in RLE even with knee immobilizer in place and therefore unable to provide sufficient effort to transfer to sitting independently.   Transfers Overall transfer level: Needs assistance   Transfers: Sit to/from Stand Sit to Stand: +2 physical  assistance;Mod assist         General transfer comment: Patient requires cuing for hand placement and physical assist secondary to pain.   Ambulation/Gait Ambulation/Gait assistance: +2 physical assistance;Mod assist Ambulation Distance (Feet): 3 Feet Assistive device: Rolling walker (2 wheeled) Gait Pattern/deviations: Step-to pattern;Antalgic;Narrow base of support   Gait velocity interpretation: <1.8 ft/sec, indicative of risk for recurrent falls General Gait Details: PAtient with minimal step lengths with significant encouragement for advancing LEs, she is able to turn appropriately with physical assistance.   Stairs            Wheelchair Mobility    Modified Rankin (Stroke Patients Only)       Balance Overall balance assessment: Needs assistance Sitting-balance support: Feet supported;Single extremity supported Sitting balance-Leahy Scale: Fair Sitting balance - Comments: Patient intermittently leans posteriorly in sitting and requires a hand on her to maintain her balance.  Postural control: Posterior lean Standing balance support: Bilateral upper extremity supported Standing balance-Leahy Scale: Poor Standing balance comment: Patient is very uncomfortable with ambulation even after IV pain medications and requires mod A x2 with RW to maintain upright balance.                              Pertinent Vitals/Pain Pain Assessment:  (Patient complaining of significant pain initially with all mobility, RN provided IV pain medication during session)    Home Living Family/patient expects to be discharged to:: Private residence Living Arrangements: Spouse/significant other;Children Available Help at Discharge: Family (Lives with daughter and son-in-law) Type of Home: House Home Access: Stairs to enter  Entrance Stairs-Number of Steps: 5 Home Layout: One level Home Equipment: Walker - 2 wheels;Bedside commode;Shower seat;Hospital bed      Prior  Function Level of Independence: Independent with assistive device(s)         Comments: Patient intermittently used RW or cane. Maintained an active lifestyle.      Hand Dominance        Extremity/Trunk Assessment   Upper Extremity Assessment: Overall WFL for tasks assessed           Lower Extremity Assessment: Generalized weakness         Communication   Communication: No difficulties  Cognition Arousal/Alertness: Awake/alert Behavior During Therapy: WFL for tasks assessed/performed;Restless Overall Cognitive Status: Within Functional Limits for tasks assessed                      General Comments      Exercises General Exercises - Lower Extremity Ankle Circles/Pumps: AROM;Both;15 reps Heel Slides: AROM;10 reps;Left;AAROM Hip ABduction/ADduction: AROM;AAROM;10 reps;Both Straight Leg Raises: AROM;AAROM;Both;10 reps      Assessment/Plan    PT Assessment Patient needs continued PT services  PT Diagnosis Difficulty walking;Abnormality of gait;Generalized weakness;Acute pain   PT Problem List Decreased strength;Decreased mobility;Decreased safety awareness;Pain;Decreased balance;Decreased activity tolerance;Decreased cognition;Decreased range of motion  PT Treatment Interventions DME instruction;Therapeutic activities;Therapeutic exercise;Gait training;Stair training;Balance training;Neuromuscular re-education;Manual techniques;Patient/family education   PT Goals (Current goals can be found in the Care Plan section) Acute Rehab PT Goals Patient Stated Goal: To return home  PT Goal Formulation: With patient/family Time For Goal Achievement: 09/15/15 Potential to Achieve Goals: Fair    Frequency BID   Barriers to discharge        Co-evaluation               End of Session Equipment Utilized During Treatment: Gait belt;Oxygen;Right knee immobilizer Activity Tolerance: Patient limited by pain Patient left: in chair;with call bell/phone within  reach;with family/visitor present;with chair alarm set Nurse Communication: Mobility status         Time: 7672-0947 PT Time Calculation (min) (ACUTE ONLY): 41 min   Charges:   PT Evaluation $Initial PT Evaluation Tier I: 1 Procedure PT Treatments $Therapeutic Exercise: 8-22 mins   PT G Codes:       Kerin Ransom, PT, DPT    09/01/2015, 3:16 PM

## 2015-09-01 NOTE — Care Management Important Message (Signed)
Important Message  Patient Details  Name: LURENA SLUYTER MRN: [id number] Date of Birth: [birth year]   Medicare Important Message Given:  Yes-second notification given    Verita Schneiders [place] 09/01/2015, 10:42 AM

## 2015-09-01 NOTE — Clinical Social Work Note (Signed)
Clinical Social Work Assessment  Patient Details  Name: [name] [name] MRN: [id number] Date of Birth: 06/16/1921  Date of referral:  09/01/15               Reason for consult:  Facility Placement                Permission sought to share information with:  Case Manager Permission granted to share information::  Yes, Verbal Permission Granted  Name::      Physicist, medical::   RN Case Manager  Relationship::     Contact Information:     Housing/Transportation Living arrangements for the past 2 months:  [place] of Information:  Patient, Adult Children Patient Interpreter Needed:  None Criminal Activity/Legal Involvement Pertinent to Current Situation/Hospitalization:  No - Comment as needed Significant Relationships:  Adult Children Lives with:  Adult Children, Spouse Do you feel safe going back to the place where you live?  Yes Need for family participation in patient care:  Yes (Comment)  Care giving concerns:  Patient lives with her husband Frankey Poot and daughter Lynnette Caffey ([place]) in [place].    Social Worker assessment / plan:  Patient has surgery yesterday with Dr. Mack Guise and PT is recommending SNF today. Clinical Education officer, museum (CSW) met with patient and her daughter Lynnette Caffey was at bedside. Per daughter she is HPOA and is the caregiver for patient and patient's husband. Per daughter they all 3 live together. CSW explained that PT is recommending rehab. Daughter refused SNF and reported that she is going to take patient home. Per daughter she is an Therapist, sports and her sister is a Engineer, drilling. Per daughter patient has a hospital bed, wheel chair and bed side commode at home. Daughter reported that her youngest brother can get patient up the 5 stairs to get into the house. Daughter also reported that she can contact patient's primary care physician 24 hours a day 7 days a week if needed. Daughter is agreeable to home health. RN Case Manager aware of above. Please reconsult if future  social work needs arise. CSW signing off.   Blima Rich, LCSWA [phone]  Employment status:  Disabled (Comment on whether or not currently receiving Disability), Retired Insurance underwriter information:  Medicare PT Recommendations:  [place] / Referral to community resources:  Other (Comment Required) (RN Case Manager [facility]) )  Patient/Family's Response to care: Daughter refused SNF and wants to take patient home.   Patient/Family's Understanding of and Emotional Response to Diagnosis, Current Treatment, and Prognosis: Daughter was pleasant throughout assessment and thanked CSW for visit.   Emotional Assessment Appearance:  Appears stated age Attitude/Demeanor/Rapport:    Affect (typically observed):  Accepting, Adaptable, Pleasant Orientation:  Oriented to Self, Oriented to Place, Oriented to  Time, Oriented to Situation Alcohol / Substance use:  Not Applicable Psych involvement (Current and /or in the community):  No (Comment)  Discharge Needs  Concerns to be addressed:  Discharge Planning Concerns Readmission within the last 30 days:  No Current discharge risk:  None Barriers to Discharge:  Continued Medical Work up   Loralyn Freshwater, LCSW 09/01/2015, 2:03 PM

## 2015-09-01 NOTE — Progress Notes (Signed)
Physical Therapy Treatment Patient Details Name: [name] [name] MRN: [id number] DOB: 1921/03/04 Today's Date: 09/01/2015    History of Present Illness Patient is a 81 y/o female that presents with R femur fx and femoral rod placement.     PT Comments    PT attempted session earlier in the afternoon however patient was on bedside commode, then felt too tired to attempt with PT after PT re-entered the room. This session may have been too late in the day as patient was more agitated and fatigued than prior session, and provided less effort with ambulation on this attempt. She is very hesitant to weight bear and/or advance her RLE in gait, though per daughter this is better than her previous hip fracture. Daughter would still like to bring patient home, in which case wheelchair may be a preferred mobility option if ambulation does not improve prior to discharge. PT continues to recommend STR to improve patient's mobility independence prior to return home. Skilled PT services continue to be indicated to address her mobility deficits.   Follow Up Recommendations  SNF     Equipment Recommendations       Recommendations for Other Services       Precautions / Restrictions Precautions Precautions: Fall Restrictions Weight Bearing Restrictions: Yes RLE Weight Bearing: Partial weight bearing Other Position/Activity Restrictions: utilized R knee immobilizer as her knee extensors are weak and significant pain noted with knee flexion    Mobility  Bed Mobility Overal bed mobility: Needs Assistance;+2 for physical assistance Bed Mobility: Supine to Sit;Sit to Supine     Supine to sit: +2 for physical assistance;Mod assist Sit to supine: +2 for physical assistance;Max assist   General bed mobility comments: Patient provides less effort with LLE and torso than previous session, one assist for RLE to maintain minimal tension and one assist for torso.   Transfers Overall transfer level: Needs  assistance Equipment used: Rolling walker (2 wheeled) Transfers: Sit to/from Stand Sit to Stand: +2 physical assistance;Mod assist         General transfer comment: Patient very hesitant to weight bear through RLE and requires significant cuing for hand placement.   Ambulation/Gait Ambulation/Gait assistance: +2 physical assistance;Mod assist Ambulation Distance (Feet): 1 Feet Assistive device: Rolling walker (2 wheeled) Gait Pattern/deviations: Step-to pattern;Antalgic;Narrow base of support   Gait velocity interpretation: <1.8 ft/sec, indicative of risk for recurrent falls General Gait Details: Patient encouraged repeatedly to advance RLE, very hesitant secondary to pain. She advances it minimally 2-3 times then fatigues. Maintains flexed trunk posture and takes her hand from strap position to front of the walker until assisted back to strap. No buckling noted with knee immobilizer in place, patient unable to advance RLE further, so ambulation discontinued.    Stairs            Wheelchair Mobility    Modified Rankin (Stroke Patients Only)       Balance Overall balance assessment: Needs assistance Sitting-balance support: Feet supported;Bilateral upper extremity supported Sitting balance-Leahy Scale: Fair Sitting balance - Comments: Patient leans posteriorly mildly, however she is able to keep herself upright with no assistance.  Postural control: Posterior lean Standing balance support: Bilateral upper extremity supported Standing balance-Leahy Scale: Poor Standing balance comment: Patient maintains flexed trunk posture throughout ambulation and does not respond to cuing, requires mod A x2 and RW to maintain posture and attempt ambulation.  Cognition Arousal/Alertness: Lethargic Behavior During Therapy: Agitated Overall Cognitive Status: Impaired/Different from baseline Area of Impairment: Orientation Orientation Level: Person (Patient  states she's at home)             General Comments: Patient generally more lethargic and agitated during this session than previous session.     Exercises General Exercises - Lower Extremity Ankle Circles/Pumps: AROM;Both;15 reps Heel Slides: AROM;10 reps;Left;AAROM Hip ABduction/ADduction: AROM;AAROM;10 reps;Both Straight Leg Raises: AROM;AAROM;Both;10 reps    General Comments        Pertinent Vitals/Pain Pain Assessment:  (Patient is confused and does not tolerate movement of RLE well in bed mobility. She is able to stand, but continues to complain of significant pain. Attempted 3 steps, then brought bed to accomodate patient. )    Home Living Family/patient expects to be discharged to:: Private residence Living Arrangements: Spouse/significant other;Children Available Help at Discharge: Family Type of Home: House Home Access: Stairs to enter   Home Layout: One level Home Equipment: Environmental consultant - 2 wheels;Bedside commode;Shower seat;Hospital bed      Prior Function Level of Independence: Independent with assistive device(s)      Comments: Patient intermittently used RW or cane. Maintained an active lifestyle.    PT Goals (current goals can now be found in the care plan section) Acute Rehab PT Goals Patient Stated Goal: To return home  PT Goal Formulation: With patient/family Time For Goal Achievement: 09/15/15 Potential to Achieve Goals: Fair Progress towards PT goals: Progressing toward goals (Though limited ambulation in this session)    Frequency  BID    PT Plan Current plan remains appropriate    Co-evaluation             End of Session Equipment Utilized During Treatment: Gait belt;Oxygen;Right knee immobilizer Activity Tolerance: Patient limited by pain Patient left: in bed;with call bell/phone within reach;with bed alarm set     Time: 2440-1027 PT Time Calculation (min) (ACUTE ONLY): 13 min  Charges:  $Gait Training: 8-22 mins $Therapeutic  Exercise: 8-22 mins                    G Codes:      Kerin Ransom, PT, DPT    09/01/2015, 4:46 PM

## 2015-09-01 NOTE — Progress Notes (Signed)
MD Sudini notified that pt has not voided.  Pt bladder scanned, with >500 ml.  Orders received and carried out. Will continue to monitor.

## 2015-09-01 NOTE — Progress Notes (Signed)
POD 1.Pt. Alert and oriented. VSS. Foley patent and will be removed this AM. Neurochecks WDL. Pain controlled with meds per MAR. Tolerating PO's. Dressing clean dry and intact. Ice pack to surgical site. Pt. Refused to dangle at bedside last night. Will attempt to dangle pt. Again this morning. Rested quietly throughout the night. Daughter at the bedside. Will continue to monitor.

## 2015-09-01 NOTE — Progress Notes (Signed)
[facility] - [place] [facility]   PATIENT NAME: [name] [name]    MR#:  [id number]  DATE OF BIRTH:  11-02-1921  SUBJECTIVE:  CHIEF COMPLAINT:   Chief Complaint  Patient presents with  . Fall  Presented after a mechanical fall. Landed on her right side and has right hip fracture. Pain at surgical site.  REVIEW OF SYSTEMS:    Review of Systems  Constitutional: Positive for malaise/fatigue. Negative for fever and chills.  HENT: Negative for sore throat.   Eyes: Negative for blurred vision, double vision and pain.  Respiratory: Negative for cough, hemoptysis, shortness of breath and wheezing.   Cardiovascular: Negative for chest pain, palpitations, orthopnea and leg swelling.  Gastrointestinal: Negative for heartburn, nausea, vomiting, abdominal pain, diarrhea and constipation.  Genitourinary: Negative for dysuria and hematuria.  Musculoskeletal: Positive for back pain and joint pain.  Skin: Negative for rash.  Neurological: Negative for sensory change, speech change, focal weakness and headaches.  Endo/Heme/Allergies: Does not bruise/bleed easily.  Psychiatric/Behavioral: Positive for memory loss. Negative for depression. The patient is not nervous/anxious.       DRUG ALLERGIES:   Allergies  Allergen Reactions  . Codeine Nausea And Vomiting  . Morphine Nausea And Vomiting  . Propoxyphene N-Acetaminophen Nausea And Vomiting    VITALS:  Blood pressure 121/60, pulse 85, temperature 98.8 F (37.1 C), temperature source Oral, resp. rate 16, height 5\' 2"  (1.575 m), weight 68.312 kg (150 lb 9.6 oz), SpO2 96 %.  PHYSICAL EXAMINATION:   Physical Exam  GENERAL:  81 y.o.-year-old patient lying in the bed with no acute distress.  EYES: Pupils equal, round, reactive to light and accommodation. No scleral icterus. Extraocular muscles intact.  HEENT: Head atraumatic, normocephalic. Oropharynx and nasopharynx clear.  NECK:  Supple, no jugular venous  distention. No thyroid enlargement, no tenderness.  LUNGS: Normal breath sounds bilaterally, no wheezing, rales, rhonchi. No use of accessory muscles of respiration.  CARDIOVASCULAR: S1, S2 normal. No murmurs, rubs, or gallops.  ABDOMEN: Soft, nontender, nondistended. Bowel sounds present. No organomegaly or mass.  EXTREMITIES: No cyanosis, clubbing or edema b/l.   Right hip tenderness and dressing. NEUROLOGIC: Cranial nerves II through XII are intact. No focal Motor or sensory deficits b/l.   PSYCHIATRIC: The patient is alert and oriented x 3.  SKIN: No obvious rash, lesion, or ulcer.    LABORATORY PANEL:   CBC  Recent Labs Lab 09/01/15 0535  WBC 7.8  HGB 8.3*  HCT 24.0*  PLT 96*   ------------------------------------------------------------------------------------------------------------------  Chemistries   Recent Labs Lab 09/01/15 0535  NA 139  K 3.9  CL 104  CO2 30  GLUCOSE 120*  BUN 10  CREATININE 0.62  CALCIUM 8.3*   ------------------------------------------------------------------------------------------------------------------  Cardiac Enzymes No results for input(s): TROPONINI in the last 168 hours. ------------------------------------------------------------------------------------------------------------------  RADIOLOGY:  Ct Hip Right Wo Contrast  08/30/2015   CLINICAL DATA:  81 year old female post unwitnessed fall outside today, now with right hip pain. History of prior right hip surgery.  EXAM: CT OF THE RIGHT HIP WITHOUT CONTRAST  TECHNIQUE: Multidetector CT imaging of the right hip was performed according to the standard protocol. Multiplanar CT image reconstructions were also generated.  COMPARISON:  Radiographs earlier this day, concerning for intertrochanteric fracture.  FINDINGS: Comminuted fracture of the intertrochanteric right hip with displacement involving the greater and lesser trochanters. Fracture extends to the base of the femoral neck.  There is subtrochanteric extension about the medial cortex. There ghost tracks with femoral  neck from prior surgery. Severe osteoarthritis of the right hip with complete joint space loss, subchondral cystic change in scleroses. Small intra-articular bodies seen inferior medially in the joint space. In cuboid acetabulum pubic rami are intact without additional fracture. Probable hematoma tracking in the medial upper thigh musculature, partially included.  IMPRESSION: 1. Comminuted displaced intertrochanteric right hip fracture extending to the base of femoral neck. There is subtrochanteric involvement involving the medial cortex. 2. Advanced osteoarthritis of the right hip. Postsurgical change with ghost tracks to the femoral neck.   Electronically Signed   By: Rubye Oaks M.D.   On: 08/30/2015 19:55   Dg Chest Port 1 View  08/30/2015   CLINICAL DATA:  Initial encounter for preoperative respiratory evaluation for hip fracture.  EXAM: PORTABLE CHEST 1 VIEW  COMPARISON:  09/13/2014.  FINDINGS: The lungs are clear without focal pneumonia, edema, pneumothorax or pleural effusion. The cardio pericardial silhouette is enlarged. Left sided permanent pacemaker/ AICD remains in place. Telemetry leads overlie the chest. Bones are diffusely demineralized.  IMPRESSION: Stable exam. Cardiomegaly without acute cardiopulmonary findings in this patient with known right-sided aortic arch.   Electronically Signed   By: Kennith Center M.D.   On: 08/30/2015 18:28   Dg C-arm 61-120 Min-no Report  08/31/2015   CLINICAL DATA: hip affixus   C-ARM 61-120 MINUTES  Fluoroscopy was utilized by the requesting physician.  No radiographic  interpretation.    Dg Hip Unilat  With Pelvis 2-3 Views Right  08/30/2015   CLINICAL DATA:  Fall. Remote history of RIGHT hip fracture. RIGHT hip pain. Initial encounter.  EXAM: DG HIP (WITH OR WITHOUT PELVIS) 2-3V RIGHT  COMPARISON:  None.  FINDINGS: There is no comparison available to assess for  interval changes. Lucency extends through the intertrochanteric RIGHT femur, suspicious for an acute intertrochanteric femur fracture. Osteopenia is present. Severe RIGHT hip osteoarthritis is present. Given the history, the deformity in the RIGHT femoral neck could be chronic. Noncontrast CT should be considered for further assessment in this patient with acute on chronic injury.  The obturator rings appear intact.  IMPRESSION: Deformity of the proximal RIGHT femur is suspicious for an acute intertrochanteric femur fracture however given the underlying healed fracture, a noncontrast CT of the RIGHT hip is recommended for further assessment.   Electronically Signed   By: Andreas Newport M.D.   On: 08/30/2015 18:27   Dg Femur, Min 2 Views Right  08/31/2015   CLINICAL DATA:  Open reduction internal fixation for fracture  EXAM: RIGHT FEMUR 2 VIEWS; DG C-ARM 61-120 MIN-NO REPORT  COMPARISON:  CT right hip region August 30, 2015  FLUOROSCOPY TIME:  0 minutes 52 seconds; 4 submitted images  FINDINGS: There is screw and rod fixation through a comminuted intertrochanteric femur fracture. Alignment is near anatomic at the fracture site. The tip of the screws in the proximal femoral head region. No dislocation. No erosive change.  IMPRESSION: Alignment at the intertrochanteric femur fracture site is near anatomic. No new fracture. No dislocation.   Electronically Signed   By: Bretta Bang III M.D.   On: 08/31/2015 15:26   Dg Femur Port, Min 2 Views Right  08/31/2015   CLINICAL DATA:  Hip fracture requiring operative repair  EXAM: RIGHT FEMUR PORTABLE 1 VIEW  COMPARISON:  August 31, 2015  FINDINGS: A right femoral rod is identified through intertrochanteric fracture without malalignment. Postoperative changes with soft tissue air and skin staples are identified in the right hip.  IMPRESSION: Postoperative changes  as described.   Electronically Signed   By: Sherian Rein M.D.   On: 08/31/2015 16:31      ASSESSMENT AND PLAN:   81 year old Caucasian female history of coronary artery disease paroxysmal atrial fibrillation presenting after mechanical fall  * Right intertrochanteric fracture Restart Eliquis. S/p surgery POD -1 Pain control. Physical therapy. Incentive Spirometer. Monitor for any complications from surgery. We will repeat hemoglobin tomorrow morning.  * Acute blood loss anemia over AOCD Monitor. Transfuse as needed  * GERD without Esophagitis: PPI therapy  * Paroxysmal atrial fibrillation Rate control meds.  * Coronary artery disease continue with beta blockade, statin therapy  All the records are reviewed and case discussed with Care Management/Social Workerr. Management plans discussed with the patient, family and they are in agreement.  CODE STATUS: DNR  DVT Prophylaxis: SCDs  TOTAL TIME TAKING CARE OF THIS PATIENT: 30 minutes.   POSSIBLE D/C IN 2-3 DAYS, DEPENDING ON CLINICAL CONDITION.   Milagros Loll R M.D on 09/01/2015 at 12:28 PM  Between 7am to 6pm - Pager - [phone]  After 6pm go to [email or web address] - password [email or web address] [facility]  [facility]  Office  [phone]  CC: Primary care physician; Alva Garnet., MD    Note: This dictation was prepared with Dragon dictation along with smaller phrase technology. Any transcriptional errors that result from this process are unintentional.

## 2015-09-01 NOTE — Progress Notes (Signed)
Subjective:  Postoperative day #1 status post right intramedullary nail fixation for hip fracture. Patient reports pain as moderate.  Patient lying in bed and in no acute distress. Family is at the bedside. No acute issues overnight.  Objective:   VITALS:   Filed Vitals:   09/01/15 0311 09/01/15 0313 09/01/15 0515 09/01/15 0641  BP: 121/44 110/44 122/40   Pulse: 78 78 41 77  Temp: 98.8 F (37.1 C)     TempSrc: Axillary     Resp: 18     Height:      Weight:      SpO2: 99%       PHYSICAL EXAM: Right lower extremity: Patient's dressing remains clean dry and intact. Her thigh compartments are soft and compressible. She has no calf tenderness or lower leg edema. She is TED stockings and foot pumps on. Pedal pulses are palpable. She can flex and extend her toes and dorsiflex and plantarflex her ankle. She has intact sensation light touch throughout the right lower extremity.   LABS  Results for orders placed or performed during the hospital encounter of 08/30/15 (from the past 24 hour(s))  CBC     Status: Abnormal   Collection Time: 09/01/15  5:35 AM  Result Value Ref Range   WBC 7.8 3.6 - 11.0 K/uL   RBC 2.23 (L) 3.80 - 5.20 MIL/uL   Hemoglobin 8.3 (L) 12.0 - 16.0 g/dL   HCT 16.1 (L) 09.6 - 04.5 %   MCV 107.7 (H) 80.0 - 100.0 fL   MCH 37.1 (H) 26.0 - 34.0 pg   MCHC 34.5 32.0 - 36.0 g/dL   RDW 40.9 (H) 81.1 - 91.4 %   Platelets 96 (L) 150 - 440 K/uL  Basic metabolic panel     Status: Abnormal   Collection Time: 09/01/15  5:35 AM  Result Value Ref Range   Sodium 139 135 - 145 mmol/L   Potassium 3.9 3.5 - 5.1 mmol/L   Chloride 104 101 - 111 mmol/L   CO2 30 22 - 32 mmol/L   Glucose, Bld 120 (H) 65 - 99 mg/dL   BUN 10 6 - 20 mg/dL   Creatinine, Ser 7.82 0.44 - 1.00 mg/dL   Calcium 8.3 (L) 8.9 - 10.3 mg/dL   GFR calc non Af Amer >60 >60 mL/min   GFR calc Af Amer >60 >60 mL/min   Anion gap 5 5 - 15    Ct Hip Right Wo Contrast  08/30/2015   CLINICAL DATA:  81 year old  female post unwitnessed fall outside today, now with right hip pain. History of prior right hip surgery.  EXAM: CT OF THE RIGHT HIP WITHOUT CONTRAST  TECHNIQUE: Multidetector CT imaging of the right hip was performed according to the standard protocol. Multiplanar CT image reconstructions were also generated.  COMPARISON:  Radiographs earlier this day, concerning for intertrochanteric fracture.  FINDINGS: Comminuted fracture of the intertrochanteric right hip with displacement involving the greater and lesser trochanters. Fracture extends to the base of the femoral neck. There is subtrochanteric extension about the medial cortex. There ghost tracks with femoral neck from prior surgery. Severe osteoarthritis of the right hip with complete joint space loss, subchondral cystic change in scleroses. Small intra-articular bodies seen inferior medially in the joint space. In cuboid acetabulum pubic rami are intact without additional fracture. Probable hematoma tracking in the medial upper thigh musculature, partially included.  IMPRESSION: 1. Comminuted displaced intertrochanteric right hip fracture extending to the base of femoral neck. There is subtrochanteric  involvement involving the medial cortex. 2. Advanced osteoarthritis of the right hip. Postsurgical change with ghost tracks to the femoral neck.   Electronically Signed   By: Rubye Oaks M.D.   On: 08/30/2015 19:55   Dg Chest Port 1 View  08/30/2015   CLINICAL DATA:  Initial encounter for preoperative respiratory evaluation for hip fracture.  EXAM: PORTABLE CHEST 1 VIEW  COMPARISON:  09/13/2014.  FINDINGS: The lungs are clear without focal pneumonia, edema, pneumothorax or pleural effusion. The cardio pericardial silhouette is enlarged. Left sided permanent pacemaker/ AICD remains in place. Telemetry leads overlie the chest. Bones are diffusely demineralized.  IMPRESSION: Stable exam. Cardiomegaly without acute cardiopulmonary findings in this patient with  known right-sided aortic arch.   Electronically Signed   By: Kennith Center M.D.   On: 08/30/2015 18:28   Dg C-arm 61-120 Min-no Report  08/31/2015   CLINICAL DATA: hip affixus   C-ARM 61-120 MINUTES  Fluoroscopy was utilized by the requesting physician.  No radiographic  interpretation.    Dg Hip Unilat  With Pelvis 2-3 Views Right  08/30/2015   CLINICAL DATA:  Fall. Remote history of RIGHT hip fracture. RIGHT hip pain. Initial encounter.  EXAM: DG HIP (WITH OR WITHOUT PELVIS) 2-3V RIGHT  COMPARISON:  None.  FINDINGS: There is no comparison available to assess for interval changes. Lucency extends through the intertrochanteric RIGHT femur, suspicious for an acute intertrochanteric femur fracture. Osteopenia is present. Severe RIGHT hip osteoarthritis is present. Given the history, the deformity in the RIGHT femoral neck could be chronic. Noncontrast CT should be considered for further assessment in this patient with acute on chronic injury.  The obturator rings appear intact.  IMPRESSION: Deformity of the proximal RIGHT femur is suspicious for an acute intertrochanteric femur fracture however given the underlying healed fracture, a noncontrast CT of the RIGHT hip is recommended for further assessment.   Electronically Signed   By: Andreas Newport M.D.   On: 08/30/2015 18:27   Dg Femur, Min 2 Views Right  08/31/2015   CLINICAL DATA:  Open reduction internal fixation for fracture  EXAM: RIGHT FEMUR 2 VIEWS; DG C-ARM 61-120 MIN-NO REPORT  COMPARISON:  CT right hip region August 30, 2015  FLUOROSCOPY TIME:  0 minutes 52 seconds; 4 submitted images  FINDINGS: There is screw and rod fixation through a comminuted intertrochanteric femur fracture. Alignment is near anatomic at the fracture site. The tip of the screws in the proximal femoral head region. No dislocation. No erosive change.  IMPRESSION: Alignment at the intertrochanteric femur fracture site is near anatomic. No new fracture. No dislocation.    Electronically Signed   By: Bretta Bang III M.D.   On: 08/31/2015 15:26   Dg Femur Port, Min 2 Views Right  08/31/2015   CLINICAL DATA:  Hip fracture requiring operative repair  EXAM: RIGHT FEMUR PORTABLE 1 VIEW  COMPARISON:  August 31, 2015  FINDINGS: A right femoral rod is identified through intertrochanteric fracture without malalignment. Postoperative changes with soft tissue air and skin staples are identified in the right hip.  IMPRESSION: Postoperative changes as described.   Electronically Signed   By: Sherian Rein M.D.   On: 08/31/2015 16:31    Assessment/Plan: 1 Day Post-Op   Principal Problem:   Intertrochanteric fracture of right femur  Patient remained stable postop. Continue current pain management. Begin physical and occupational therapy. Foley to be discontinued today. Complete 24 hours of postop antibiotics. Continue to monitor vitals. Recheck labs tomorrow AM.  Juanell Fairly , MD 09/01/2015, 7:51 AM

## 2015-09-01 NOTE — Progress Notes (Addendum)
Pt. Dangled at bedside and tolerated it well. Foley removed.

## 2015-09-02 LAB — CBC
HEMATOCRIT: 24.2 % — AB (ref 35.0–47.0)
HEMOGLOBIN: 8.4 g/dL — AB (ref 12.0–16.0)
MCH: 35.3 pg — ABNORMAL HIGH (ref 26.0–34.0)
MCHC: 34.5 g/dL (ref 32.0–36.0)
MCV: 102.2 fL — ABNORMAL HIGH (ref 80.0–100.0)
Platelets: 108 10*3/uL — ABNORMAL LOW (ref 150–440)
RBC: 2.37 MIL/uL — AB (ref 3.80–5.20)
RDW: 15.4 % — ABNORMAL HIGH (ref 11.5–14.5)
WBC: 9.4 10*3/uL (ref 3.6–11.0)

## 2015-09-02 LAB — BASIC METABOLIC PANEL
ANION GAP: 8 (ref 5–15)
BUN: 12 mg/dL (ref 6–20)
CHLORIDE: 97 mmol/L — AB (ref 101–111)
CO2: 34 mmol/L — AB (ref 22–32)
CREATININE: 0.67 mg/dL (ref 0.44–1.00)
Calcium: 8.4 mg/dL — ABNORMAL LOW (ref 8.9–10.3)
GFR calc non Af Amer: >60 mL/min (ref >60)
Glucose, Bld: 125 mg/dL — ABNORMAL HIGH (ref 65–99)
Potassium: 3 mmol/L — ABNORMAL LOW (ref 3.5–5.1)
SODIUM: 139 mmol/L (ref 135–145)

## 2015-09-02 MED ORDER — OXYCODONE-ACETAMINOPHEN 5-325 MG PO TABS
1.0000 | ORAL_TABLET | Freq: Three times a day (TID) | ORAL | Status: AC
  Filled 2015-09-02: qty 1

## 2015-09-02 MED ORDER — OXYCODONE-ACETAMINOPHEN 5-325 MG PO TABS: 1.0000 | ORAL_TABLET | Freq: Three times a day (TID) | ORAL | Status: DC

## 2015-09-02 MED ORDER — HYDROMORPHONE HCL 1 MG/ML IJ SOLN
1.0000 mg | Freq: Once | INTRAMUSCULAR | Status: AC
  Administered 2015-09-02: 1 mg via INTRAVENOUS
  Filled 2015-09-02: qty 1

## 2015-09-02 MED ORDER — SODIUM CHLORIDE 0.9 % IV SOLN
INTRAVENOUS | Status: DC
  Administered 2015-09-02: 11:00:00 via INTRAVENOUS

## 2015-09-02 MED ORDER — POTASSIUM CHLORIDE CRYS ER 20 MEQ PO TBCR
40.0000 meq | EXTENDED_RELEASE_TABLET | ORAL | Status: AC
  Administered 2015-09-02 (×2): 40 meq via ORAL
  Filled 2015-09-02 (×2): qty 2

## 2015-09-02 MED ORDER — OXYCODONE-ACETAMINOPHEN 5-325 MG PO TABS
1.0000 | ORAL_TABLET | Freq: Three times a day (TID) | ORAL | Status: DC
  Administered 2015-09-02 – 2015-09-06 (×12): 1 via ORAL
  Filled 2015-09-02 (×11): qty 1

## 2015-09-02 NOTE — Progress Notes (Signed)
Potassium level 3.0. Dr. Betti Cruz ordered additional of potassium po.

## 2015-09-02 NOTE — Progress Notes (Signed)
Pts potasium 3.0 this am.  MD Sudini notified. Per MD hold morning dose of Lasix.MD also placing orders. Will continue to monitor.

## 2015-09-02 NOTE — Progress Notes (Signed)
Physical Therapy Treatment Patient Details Name: [name] [name] MRN: [id number] DOB: Nov 15, 1921 Today's Date: 09/02/2015    History of Present Illness Patient is a 81 y/o female that presents with R femur fx and femoral rod placement.  Per telephone clarification with Dr. Martha Clan (09/02/15), patient to be PWB R LE, KI not requried (but okay to use for safety if lacking knee/quad control)    PT Comments    Patient remains very limited, guarded by pain in R LE.  Upright activity limited to unsupported sitting due to symptomatic hypotension this AM (RN informed/aware).   Currently requiring max assist +2 for all bed mobility, as patient with very limited active participation due to pain in R LE with all movement transitions.  Does demonstrate improved R quad activation in isolated, open-chain activities, but question ability to carry-over into closed-chain activities. Planned to attempt scoot pivot transfers between surfaces this date (to allow for improved patient indep and improved pain control with mobility), but unable to tolerate secondary to symptomatic hypotension.  Will continue efforts in PM as medically appropriate and patient able to tolerate.  If able to safely complete, may benefit from equipment to allow scoot pivot transfers between surfaces in home environment (manual WC with removable arm rests, DABSC) if daughter insistent on discharge home. Therapist recommendations remain for STR for more frequent, intensive therapy sessions and higher level of skilled medical care to allow for optimal return to PLOF.    Follow Up Recommendations  SNF     Equipment Recommendations   (may benefit from manual WC (with removeable arm rests), DABSC)    Recommendations for Other Services       Precautions / Restrictions Precautions Precautions: Fall Restrictions Weight Bearing Restrictions: Yes RLE Weight Bearing: Partial weight bearing    Mobility  Bed Mobility Overal bed mobility:  Needs Assistance;+2 for physical assistance Bed Mobility: Supine to Sit;Sit to Supine     Supine to sit: Max assist;+2 for physical assistance Sit to supine: Max assist;+2 for physical assistance   General bed mobility comments: total assist for upper and lower body management, very guarded and limited by pain in R LE  Transfers                 General transfer comment: unable to attempt this date due to hypotension with sitting  Ambulation/Gait             General Gait Details: unable to attempt this date due to hypotension with sitting   Stairs            Wheelchair Mobility    Modified Rankin (Stroke Patients Only)       Balance Overall balance assessment: Needs assistance Sitting-balance support: Feet supported;Bilateral upper extremity supported Sitting balance-Leahy Scale: Fair Sitting balance - Comments: requires bilat UE support to maintain unsupported sitting balance; kyphotic posture with limited ant pelvic tilt, lumbar extension; excessive weight shift to L hip (to unweight R LE in WBing position)                            Cognition Arousal/Alertness: Awake/alert Behavior During Therapy: WFL for tasks assessed/performed Overall Cognitive Status: Within Functional Limits for tasks assessed                      Exercises Other Exercises Other Exercises: R LE supine therex, 1x10, act vs. act assist ROM: ankle pumps, quad sets, SAQs, hip abduct/adduct and  heel slides (within tolerable range).  Tolerating approx 45 degrees of hip/knee flexion this date, remains limited by pain.  Improving quad activation with isolated therex; question ability to carry over into closed-chain activities. Other Exercises: Attempted lateral scooting edge of bed, min/mod assist for LE position, movement sequencing.  Unable to fully clear buttocks from bed surface; extensive encouragement required throughout. Other Exercises: Additional sitting activity  deferred secondary to symptomatic hypotension.    General Comments        Pertinent Vitals/Pain Pain Assessment: Faces Faces Pain Scale: Hurts whole lot Pain Location: R LE Pain Descriptors / Indicators: Aching Pain Intervention(s): Limited activity within patient's tolerance;Monitored during session;Premedicated before session;Repositioned    Home Living                      Prior Function            PT Goals (current goals can now be found in the care plan section) Acute Rehab PT Goals Patient Stated Goal: per daughter "I still want to take her home" PT Goal Formulation: With patient/family Time For Goal Achievement: 09/15/15 Potential to Achieve Goals: Fair Progress towards PT goals: Not progressing toward goals - comment (limited due to symptomatic hypotension)    Frequency  BID    PT Plan Current plan remains appropriate    Co-evaluation             End of Session Equipment Utilized During Treatment: Gait belt Activity Tolerance: Patient tolerated treatment well Patient left: in bed;with call bell/phone within reach;with bed alarm set;with family/visitor present     Time: 0981-1914 PT Time Calculation (min) (ACUTE ONLY): 34 min  Charges:  $Therapeutic Exercise: 8-22 mins $Therapeutic Activity: 8-22 mins                    G Codes:      Trystian Crisanto H. Manson Passey, PT, DPT, NCS 09/02/2015, 10:18 AM [phone]

## 2015-09-02 NOTE — Progress Notes (Signed)
[facility] - [place] at [facility]   PATIENT NAME: [name] [name]    MR#:  [id number]  DATE OF BIRTH:  June 17, 1921  SUBJECTIVE:  CHIEF COMPLAINT:   Chief Complaint  Patient presents with  . Fall  Presented after a mechanical fall. Landed on her right side and has right hip fracture. Pain at surgical site. Blood pressure on the lower side. Daughter mentions patient always runs low with blood pressure.  REVIEW OF SYSTEMS:    Review of Systems  Constitutional: Positive for malaise/fatigue. Negative for fever and chills.  HENT: Negative for sore throat.   Eyes: Negative for blurred vision, double vision and pain.  Respiratory: Negative for cough, hemoptysis, shortness of breath and wheezing.   Cardiovascular: Negative for chest pain, palpitations, orthopnea and leg swelling.  Gastrointestinal: Negative for heartburn, nausea, vomiting, abdominal pain, diarrhea and constipation.  Genitourinary: Negative for dysuria and hematuria.  Musculoskeletal: Positive for back pain and joint pain.  Skin: Negative for rash.  Neurological: Negative for sensory change, speech change, focal weakness and headaches.  Endo/Heme/Allergies: Does not bruise/bleed easily.  Psychiatric/Behavioral: Positive for memory loss. Negative for depression. The patient is not nervous/anxious.       DRUG ALLERGIES:   Allergies  Allergen Reactions  . Codeine Nausea And Vomiting  . Morphine Nausea And Vomiting  . Propoxyphene N-Acetaminophen Nausea And Vomiting    VITALS:  Blood pressure 94/36, pulse 81, temperature 98.4 F (36.9 C), temperature source Oral, resp. rate 16, height 5\' 2"  (1.575 m), weight 68.312 kg (150 lb 9.6 oz), SpO2 96 %.  PHYSICAL EXAMINATION:   Physical Exam  GENERAL:  81 y.o.-year-old patient lying in the bed with no acute distress.  EYES: Pupils equal, round, reactive to light and accommodation. No scleral icterus. Extraocular muscles intact.  HEENT: Head  atraumatic, normocephalic. Oropharynx and nasopharynx clear.  NECK:  Supple, no jugular venous distention. No thyroid enlargement, no tenderness.  LUNGS: Normal breath sounds bilaterally, no wheezing, rales, rhonchi. No use of accessory muscles of respiration.  CARDIOVASCULAR: S1, S2 normal. No murmurs, rubs, or gallops.  ABDOMEN: Soft, nontender, nondistended. Bowel sounds present. No organomegaly or mass.  EXTREMITIES: No cyanosis, clubbing or edema b/l.   Right hip tenderness and dressing. NEUROLOGIC: Cranial nerves II through XII are intact. No focal Motor or sensory deficits b/l.   PSYCHIATRIC: The patient is alert and oriented x 3.  SKIN: No obvious rash, lesion, or ulcer.    LABORATORY PANEL:   CBC  Recent Labs Lab 09/02/15 0452  WBC 9.4  HGB 8.4*  HCT 24.2*  PLT 108*   ------------------------------------------------------------------------------------------------------------------  Chemistries   Recent Labs Lab 09/02/15 0452  NA 139  K 3.0*  CL 97*  CO2 34*  GLUCOSE 125*  BUN 12  CREATININE 0.67  CALCIUM 8.4*   ------------------------------------------------------------------------------------------------------------------  Cardiac Enzymes No results for input(s): TROPONINI in the last 168 hours. ------------------------------------------------------------------------------------------------------------------  RADIOLOGY:  Dg C-arm 61-120 Min-no Report  08/31/2015   CLINICAL DATA: hip affixus   C-ARM 61-120 MINUTES  Fluoroscopy was utilized by the requesting physician.  No radiographic  interpretation.    Dg Femur, Min 2 Views Right  08/31/2015   CLINICAL DATA:  Open reduction internal fixation for fracture  EXAM: RIGHT FEMUR 2 VIEWS; DG C-ARM 61-120 MIN-NO REPORT  COMPARISON:  CT right hip region August 30, 2015  FLUOROSCOPY TIME:  0 minutes 52 seconds; 4 submitted images  FINDINGS: There is screw and rod fixation through a comminuted intertrochanteric  femur fracture. Alignment is near anatomic at the fracture site. The tip of the screws in the proximal femoral head region. No dislocation. No erosive change.  IMPRESSION: Alignment at the intertrochanteric femur fracture site is near anatomic. No new fracture. No dislocation.   Electronically Signed   By: Bretta Bang III M.D.   On: 08/31/2015 15:26   Dg Femur Port, Min 2 Views Right  08/31/2015   CLINICAL DATA:  Hip fracture requiring operative repair  EXAM: RIGHT FEMUR PORTABLE 1 VIEW  COMPARISON:  August 31, 2015  FINDINGS: A right femoral rod is identified through intertrochanteric fracture without malalignment. Postoperative changes with soft tissue air and skin staples are identified in the right hip.  IMPRESSION: Postoperative changes as described.   Electronically Signed   By: Sherian Rein M.D.   On: 08/31/2015 16:31     ASSESSMENT AND PLAN:   81 year old Caucasian female history of coronary artery disease paroxysmal atrial fibrillation presenting after mechanical fall  * Right intertrochanteric fracture Restart Eliquis. S/p surgery POD -2 Pain control. Physical therapy. Incentive Spirometer. Monitor for any complications from surgery. We will repeat hemoglobin tomorrow morning.  * Acute blood loss anemia over AOCD Monitor. Transfuse as needed. She will need 1 unit packed RBC if she has any further hypotension. Repeat hemoglobin levels.  * GERD without Esophagitis: PPI therapy  * Paroxysmal atrial fibrillation Rate control meds.  * Coronary artery disease continue with beta blockade, statin therapy  All the records are reviewed and case discussed with Care Management/Social Workerr. Management plans discussed with the patient, family and they are in agreement.  CODE STATUS: DNR  DVT Prophylaxis: SCDs  TOTAL TIME TAKING CARE OF THIS PATIENT: 35 minutes.   Daughter mentions that they would prefer patient going home with home health. Refused skilled nursing  facility.   Milagros Loll R M.D on 09/02/2015 at 2:17 PM  Between 7am to 6pm - Pager - [phone]  After 6pm go to [email or web address] - password [email or web address] [facility]  [place] [facility]  Office  [phone]  CC: Primary care physician; Alva Garnet., MD    Note: This dictation was prepared with Dragon dictation along with smaller phrase technology. Any transcriptional errors that result from this process are unintentional.

## 2015-09-02 NOTE — Progress Notes (Signed)
Physical Therapy Treatment Patient Details Name: [name] [name] MRN: [id number] DOB: [birth year] Today's Date: 09/02/2015    History of Present Illness Patient is a 81 y/o female that presents with R femur fx and femoral rod placement.  Per telephone clarification with Dr. Martha Clan (09/02/15), patient to be PWB R LE, KI not requried (but okay to use for safety if lacking knee/quad control)    PT Comments    Patient remains generally orthostatic with initial transition to upright, but appears to resolve with accommodation to position.  Able to complete bed/chair transfer with RW this date, min/mod assist +2.  Slightly impulsive and quick to attempt sitting (prior to full alignment with chair surface).  Requires step by step cuing and increased encouragement for safety and participation with all activities. Additional gait/standing activity limited by R hip pain.   Follow Up Recommendations  SNF     Equipment Recommendations       Recommendations for Other Services       Precautions / Restrictions Precautions Precautions: Fall Restrictions Weight Bearing Restrictions: Yes RLE Weight Bearing: Partial weight bearing    Mobility  Bed Mobility Overal bed mobility: Needs Assistance;+2 for physical assistance Bed Mobility: Supine to Sit     Supine to sit: Max assist;+2 for physical assistance     General bed mobility comments: total assist for upper and lower body management, very guarded and limited by pain in R LE  Transfers Overall transfer level: Needs assistance Equipment used: Rolling walker (2 wheeled) Transfers: Sit to/from Stand Sit to Stand: Min assist;Mod assist;+2 physical assistance         General transfer comment: from elevated bed surface; increased time to complete, patient pulling up on RW with bilat  UEs  Ambulation/Gait Ambulation/Gait assistance: Min assist;Mod assist;+2 physical assistance Ambulation Distance (Feet): 3 Feet Assistive device: Rolling  walker (2 wheeled)       General Gait Details: SPT from bed/chair with RW, min/mod assist +2 for safety.  Step by step cuing for technique and for PWB R LE.  Patient slightly impulsive, attempting to sit prior to full alignment with seating surface.  Additional distance limited by pain in R LE.   Stairs            Wheelchair Mobility    Modified Rankin (Stroke Patients Only)       Balance Overall balance assessment: Needs assistance Sitting-balance support: No upper extremity supported;Feet supported Sitting balance-Leahy Scale: Fair Sitting balance - Comments: requires bilat UE support to maintain unsupported sitting balance; kyphotic posture with limited ant pelvic tilt, lumbar extension; excessive weight shift to L hip (to unweight R LE in WBing position)   Standing balance support: Bilateral upper extremity supported Standing balance-Leahy Scale: Poor                      Cognition                            Exercises Other Exercises Other Exercises: Unsupported sitting, completed lateral weight shifting activities to promote R lateral weight shift/increased WBing R hip.  Constant encouragement throughout.      General Comments        Pertinent Vitals/Pain Pain Assessment: Faces Faces Pain Scale: Hurts even more Pain Location: R LE Pain Descriptors / Indicators: Aching Pain Intervention(s): Limited activity within patient's tolerance;Monitored during session;Premedicated before session;Repositioned    Home Living  Prior Function            PT Goals (current goals can now be found in the care plan section) Acute Rehab PT Goals Patient Stated Goal: per daughter "I still want to take her home" PT Goal Formulation: With patient/family Time For Goal Achievement: 09/15/15 Potential to Achieve Goals: Fair Progress towards PT goals: Progressing toward goals    Frequency  BID    PT Plan Current plan remains  appropriate    Co-evaluation             End of Session Equipment Utilized During Treatment: Gait belt Activity Tolerance: Patient tolerated treatment well Patient left: with call bell/phone within reach;with family/visitor present;in chair;with chair alarm set     Time: 1410-1434 PT Time Calculation (min) (ACUTE ONLY): 24 min  Charges:  $Therapeutic Activity: 23-37 mins                    G Codes:      Kristen H. Manson Passey, PT, DPT, NCS 09/02/2015, 3:52 PM [phone]

## 2015-09-02 NOTE — Progress Notes (Signed)
Pts. Daughter is upset that pts. Pain is not being relieved with the oxycodone 10mg  po q4h she is receiving. Daughter request I call and get dilaudid started back. Dr. Betti Cruz gave telephone order for dilaudid 1mg  IV one time order til daughter can talk with pts. Dr. Bea Laura.

## 2015-09-02 NOTE — Anesthesia Postprocedure Evaluation (Signed)
  Anesthesia Post-op Note  Patient: [name] [name]  Procedure(s) Performed: Procedure(s): INTRAMEDULLARY (IM) NAIL FEMORAL (Right)  Anesthesia type:General ETT  Patient location: PACU  Post pain: Pain level controlled  Post assessment: Post-op Vital signs reviewed, Patient's Cardiovascular Status Stable, Respiratory Function Stable, Patent Airway and No signs of Nausea or vomiting  Post vital signs: Reviewed and stable  Last Vitals:  Filed Vitals:   09/02/15 0728  BP: 90/40  Pulse: 78  Temp:   Resp:     Level of consciousness: awake, alert  and patient cooperative  Complications: No apparent anesthesia complications

## 2015-09-02 NOTE — Progress Notes (Signed)
MD notified about pts BP 90/40. Per MD hold BP med. OK to give 10 mg Oxycodone now. Will continue to monitor.

## 2015-09-03 LAB — BASIC METABOLIC PANEL
ANION GAP: 6 (ref 5–15)
BUN: 17 mg/dL (ref 6–20)
CHLORIDE: 101 mmol/L (ref 101–111)
CO2: 31 mmol/L (ref 22–32)
Calcium: 8.4 mg/dL — ABNORMAL LOW (ref 8.9–10.3)
Creatinine, Ser: 0.68 mg/dL (ref 0.44–1.00)
GFR calc non Af Amer: >60 mL/min (ref >60)
Glucose, Bld: 102 mg/dL — ABNORMAL HIGH (ref 65–99)
POTASSIUM: 4.2 mmol/L (ref 3.5–5.1)
Sodium: 138 mmol/L (ref 135–145)

## 2015-09-03 LAB — CBC
HEMATOCRIT: 22.9 % — AB (ref 35.0–47.0)
Hemoglobin: 7.8 g/dL — ABNORMAL LOW (ref 12.0–16.0)
MCH: 34.8 pg — ABNORMAL HIGH (ref 26.0–34.0)
MCHC: 34.3 g/dL (ref 32.0–36.0)
MCV: 101.3 fL — ABNORMAL HIGH (ref 80.0–100.0)
Platelets: 133 10*3/uL — ABNORMAL LOW (ref 150–440)
RBC: 2.26 MIL/uL — ABNORMAL LOW (ref 3.80–5.20)
RDW: 15.8 % — ABNORMAL HIGH (ref 11.5–14.5)
WBC: 9.3 10*3/uL (ref 3.6–11.0)

## 2015-09-03 LAB — PREPARE RBC (CROSSMATCH)

## 2015-09-03 MED ORDER — FUROSEMIDE 20 MG PO TABS
20.0000 mg | ORAL_TABLET | Freq: Once | ORAL | Status: AC
  Administered 2015-09-03: 20 mg via ORAL
  Filled 2015-09-03: qty 1

## 2015-09-03 MED ORDER — SODIUM CHLORIDE 0.9 % IV SOLN
Freq: Once | INTRAVENOUS | Status: AC
  Administered 2015-09-03: 15:00:00 via INTRAVENOUS

## 2015-09-03 MED ORDER — DIPHENHYDRAMINE HCL 25 MG PO CAPS
25.0000 mg | ORAL_CAPSULE | Freq: Once | ORAL | Status: AC
  Administered 2015-09-03: 25 mg via ORAL
  Filled 2015-09-03: qty 1

## 2015-09-03 MED ORDER — ENSURE ENLIVE PO LIQD
237.0000 mL | Freq: Two times a day (BID) | ORAL | Status: DC
  Administered 2015-09-04 – 2015-09-05 (×2): 237 mL via ORAL

## 2015-09-03 NOTE — Discharge Instructions (Signed)
Orthopedic discharge instructions:   Patient should remain partial weightbearing on the right lower extremity for progressive 4 weeks. She should use her walker for assistance with stimulation at all times. Continue TED stockings until follow-up. She may remove them for sleep. Patient should continue to elevate the lower extremity whenever possible and may continue to apply ice to the surgical site. Keep the incisions clean dry and intact and cover them if the patient gets in the shower. Patient will follow up with Dr. Martha Clan in the office in approximately 10-14 days following discharge.

## 2015-09-03 NOTE — Progress Notes (Signed)
OT Cancellation Note  Patient Details Name: [name] [name] MRN: [id number] DOB: 01-07-1921   Cancelled Treatment:    Reason Eval/Treat Not Completed: Other (comment) Patient receiving blood.  Ocie Cornfield 09/03/2015, 3:47 PM

## 2015-09-03 NOTE — Care Management Important Message (Signed)
Important Message  Patient Details  Name: [name] [name] MRN: [id number] Date of Birth: March 30, 1921   Medicare Important Message Given:  Yes-third notification given    Olegario Messier A Allmond 09/03/2015, 1:18 PM

## 2015-09-03 NOTE — Progress Notes (Signed)
Physical Therapy Treatment Patient Details Name: [name] [name] MRN: [id number] DOB: May 18, 1921 Today's Date: 09/03/2015    History of Present Illness Patient is a 81 y/o female that presents with R femur fx and femoral rod placement.  Per telephone clarification with Dr. Martha Clan (09/02/15), patient to be PWB R LE, KI not requried (but okay to use for safety if lacking knee/quad control)    PT Comments    Heavy encouragement and assist required to complete all simple therex; remains very guarded  and resistant to all movement attempts.  Restricted to bed-level activities this AM (per RN request) due to pending blood transfusion.  Anticipate very slow progress with continued need for +2 for all OOB attempts.   Follow Up Recommendations  SNF     Equipment Recommendations       Recommendations for Other Services       Precautions / Restrictions Precautions Precautions: Fall Restrictions Weight Bearing Restrictions: Yes RLE Weight Bearing: Partial weight bearing    Mobility  Bed Mobility               General bed mobility comments: refused rolling attempts  Transfers                 General transfer comment: bed-level throughout session due to pending blood transfusion per RN request  Ambulation/Gait             General Gait Details: bed-level throughout session due to pending blood transfusion per RN request   Stairs            Wheelchair Mobility    Modified Rankin (Stroke Patients Only)       Balance                                    Cognition                            Exercises Other Exercises Other Exercises: R LE supine therex, 1x15, act vs. act assist ROM: ankle pumps, quad sets, SAQs (with eccentric lowering), hip abduct/adduct, heel slides.  Heavy encouragement and assist required to complete all therex; remains very guarded  and resistant to all movement attempts.    General Comments         Pertinent Vitals/Pain Pain Assessment: Faces Pain Score: 8  Faces Pain Scale: Hurts whole lot Pain Location: R LE Pain Descriptors / Indicators: Aching;Grimacing;Guarding Pain Intervention(s): Limited activity within patient's tolerance;Monitored during session;Premedicated before session;Repositioned    Home Living                      Prior Function            PT Goals (current goals can now be found in the care plan section) Acute Rehab PT Goals Patient Stated Goal: per daughter "I still want to take her home" PT Goal Formulation: With patient/family Time For Goal Achievement: 09/15/15 Potential to Achieve Goals: Fair Progress towards PT goals: Not progressing toward goals - comment (unable to attempt OOB this AM due to pending blood transfusion (per RN request))    Frequency  BID    PT Plan Current plan remains appropriate    Co-evaluation             End of Session   Activity Tolerance: Patient limited by pain Patient left: in bed;with call  bell/phone within reach;with chair alarm set     Time: 3559-7416 PT Time Calculation (min) (ACUTE ONLY): 17 min  Charges:  $Therapeutic Exercise: 8-22 mins                    G Codes:      Kristen H. Manson Passey, PT, DPT, NCS 09/03/2015, 11:11 AM [phone]

## 2015-09-03 NOTE — Progress Notes (Signed)
PT Cancellation Note  Patient Details Name: MATTINGLY NEIMEYER MRN: [id number] DOB: [birth year]   Cancelled Treatment:    Reason Eval/Treat Not Completed: Other (comment) (Treatment attempted x2 this AM.  Initial attempt, patient just received pain medicine and request therapist return at later time.  Second attempt, patient eating breakfast (1030).  Will re-attempt one additional time in AM.)   Kristen H. Manson Passey, PT, DPT, NCS 09/03/2015, 10:28 AM [phone]

## 2015-09-03 NOTE — Progress Notes (Signed)
Physical Therapy Treatment Patient Details Name: [name] [name] MRN: [id number] DOB: [birth year] Today's Date: 09/03/2015    History of Present Illness Patient is a 81 y/o female that presents with R femur fx and femoral rod placement.  Per telephone clarification with Dr. Martha Clan (09/02/15), patient to be PWB R LE, KI not requried (but okay to use for safety if lacking knee/quad control)    PT Comments    Per primary RN, patient's daughter insistent that she needs to get to/from chair this PM to increase likelihood of discharge home next date; okay for OOB as tolerated until blood transfusion initiated. Patient able to complete multiple reps of sit/stand with RW and able to initiate very short-distance gait with RW (8'), mod assist +2 at all times. Very unsteady; Not safe for ambulation as primary mobility at this time; recommend WC for household/community distance mobility. Continues to require extensive encouragement for participation with any/all therapeutic activities; daughter remains insistent on discharge home despite observing patient's performance and consistent need for +2 with all mobility during all therapy sessions to date.    Follow Up Recommendations  SNF     Equipment Recommendations       Recommendations for Other Services       Precautions / Restrictions Precautions Precautions: Fall Restrictions Weight Bearing Restrictions: Yes RLE Weight Bearing: Partial weight bearing    Mobility  Bed Mobility Overal bed mobility: Needs Assistance;+2 for physical assistance Bed Mobility: Supine to Sit;Sit to Supine     Supine to sit: Max assist;+2 for physical assistance Sit to supine: Max assist;+2 for physical assistance      Transfers Overall transfer level: Needs assistance Equipment used: Rolling walker (2 wheeled) Transfers: Sit to/from Stand Sit to Stand: Min assist;Mod assist         General transfer comment: maintains R LE slightly anterior to BOS,  very slow and guarded; cuing for hand placement and overall movement sequencing  Ambulation/Gait Ambulation/Gait assistance: Mod assist;+2 physical assistance Ambulation Distance (Feet): 8 Feet Assistive device: Rolling walker (2 wheeled)   Gait velocity: step to gait pattern with very slow, effortful cadence; assist from therapist for R LE advancement 50% time.  Progressive forward trunk flexion as fatigue increases.  Not safe for ambulation as primary mobility at this time; recommend WC for household/community distance mobility.       Stairs            Wheelchair Mobility    Modified Rankin (Stroke Patients Only)       Balance Overall balance assessment: Needs assistance Sitting-balance support: No upper extremity supported;Feet supported Sitting balance-Leahy Scale: Fair Sitting balance - Comments: requires bilat UE support to maintain unsupported sitting balance; kyphotic posture with limited ant pelvic tilt, lumbar extension; excessive weight shift to L hip (to unweight R LE in WBing position)   Standing balance support: Bilateral upper extremity supported Standing balance-Leahy Scale: Poor                      Cognition Arousal/Alertness: Awake/alert Behavior During Therapy: WFL for tasks assessed/performed                        Exercises Other Exercises Other Exercises: Sit/stand from edge of bed x4 with RW, min/mod assist x4; dep for clothing management and hygiene after incontinent BM.  Mod assist +1-2 for standing balance and to prevent spontaneous sitting. Other Exercises: Bed/chair and chair/bed transfer with RW, mod assist +2 via  SPT.  Step by step cuing for sequencing.  Limited ability to advance or accept weight onto R LE with movement transitions.  Slightly impulsive when approaching seating surfaces.    General Comments        Pertinent Vitals/Pain Pain Assessment: 0-10 Faces Pain Scale: Hurts worst Pain Location: R LE Pain  Descriptors / Indicators: Aching;Guarding;Grimacing Pain Intervention(s): Limited activity within patient's tolerance;Monitored during session;Repositioned    Home Living                      Prior Function            PT Goals (current goals can now be found in the care plan section) Acute Rehab PT Goals Patient Stated Goal: per daughter "I still want to take her home" PT Goal Formulation: With patient/family Time For Goal Achievement: 09/15/15 Potential to Achieve Goals: Fair Progress towards PT goals: Progressing toward goals    Frequency  BID    PT Plan Current plan remains appropriate    Co-evaluation             End of Session Equipment Utilized During Treatment: Gait belt Activity Tolerance: Patient limited by pain Patient left: in bed;with call bell/phone within reach;with nursing/sitter in room;with family/visitor present (nursing at bedside for additional hygiene and pending blood transfusion)     Time: 3382-5053 PT Time Calculation (min) (ACUTE ONLY): 42 min  Charges:  $Gait Training: 8-22 mins $Therapeutic Activity: 23-37 mins                    G Codes:      Kristen H. Manson Passey, PT, DPT, NCS 09/03/2015, 3:44 PM [phone]

## 2015-09-03 NOTE — Progress Notes (Signed)
Initial Nutrition Assessment   INTERVENTION:   Meals and Snacks: Cater to patient preferences Medical Food Supplement Therapy: will recommend Ensure Enlive po BID, each supplement provides 350 kcal and 20 grams of protein  Coordination of Care: will request new weight   NUTRITION DIAGNOSIS:   Inadequate oral intake related to acute illness as evidenced by meal completion < 25%.  GOAL:   Patient will meet greater than or equal to 90% of their needs  MONITOR:    (Energy Intake, Anthropometrics, Digestive system)  REASON FOR ASSESSMENT:   LOS    ASSESSMENT:   Pt admitted after a fall with right femur fracture with surgical intervention on 9/27.  Past Medical History  Diagnosis Date  . Pacemaker     Biventricular, [employer]   . CAD in native artery   . LBBB (left bundle branch block)   . Chronic systolic heart failure   . Cardiomyopathy, primary   . Hypertension   . Hyperlipidemia   . Urticaria   . Urinary incontinence   . Osteoarthritis   . Spinal stenosis, lumbar      Diet Order:  Diet regular Room service appropriate?: Yes; Fluid consistency:: Thin    Current Nutrition: Pt reports eating very little at lunch today secondary to pain. Recorded po intake bites of meals, with the exception of yesteray at lunch eating 100%.    Food/Nutrition-Related History: Per [facility] pt without decreased appetite PTA. Pt reports having had Ensure in the past not recently.   Medications: vitamin D, Colace, ferrous sulfate, lasix, protonix  Electrolyte/Renal Profile and Glucose Profile:   Recent Labs Lab 09/01/15 0535 09/02/15 0452 09/03/15 0425  NA 139 139 138  K 3.9 3.0* 4.2  CL 104 97* 101  CO2 30 34* 31  BUN 10 12 17   CREATININE 0.62 0.67 0.68  CALCIUM 8.3* 8.4* 8.4*  GLUCOSE 120* 125* 102*   Protein Profile:  Recent Labs Lab 08/30/15 2152  ALBUMIN 4.1    Gastrointestinal Profile: Last BM:  09/03/2015   Nutrition-Focused Physical Exam Findings:   Unable to complete Nutrition-Focused physical exam at this time.    Weight Change: Per [facility] pt without decreased weight. Per [facility] pt with 16% weight loss in 2 years. RD questions accuracy of current measured weight of 150lbs.   Skin:  Reviewed, no issues   Height:   Ht Readings from Last 1 Encounters:  08/30/15 5\' 2"  (1.575 m)    Weight:   Wt Readings from Last 1 Encounters:  08/30/15 150 lb 9.6 oz (68.312 kg)    Wt Readings from Last 10 Encounters:  08/30/15 150 lb 9.6 oz (68.312 kg)  06/08/15 113 lb 4 oz (51.37 kg)  03/02/15 115 lb 6.4 oz (52.345 kg)  09/15/14 118 lb 12 oz (53.865 kg)  07/28/14 119 lb 9.6 oz (54.25 kg)  06/01/14 120 lb (54.432 kg)  09/09/13 124 lb (56.246 kg)  08/08/13 123 lb (55.792 kg)  06/17/13 133 lb (60.328 kg)  03/13/13 134 lb 12 oz (61.122 kg)    BMI:  Body mass index is 27.54 kg/(m^2).   EDUCATION NEEDS:   Education needs no appropriate at this time   LOW Care Level  Leda Quail, [place], LDN Pager [phone]

## 2015-09-03 NOTE — Progress Notes (Signed)
  Subjective:  Positive #3 status post intramedullary fixation for right intertrochanteric hip fracture. Patient states that her pain is improving each day. Patient reports pain as mild.  Pain increases with right hip movement. She denies any chest pain, shortness of breath or abdominal pain. Patient has had hypotension over the past several blood pressure readings. Her hematocrit has dropped below 8.  Objective:   VITALS:   Filed Vitals:   09/02/15 2203 09/03/15 0451 09/03/15 0453 09/03/15 0455  BP:  92/45 88/46 95/41   Pulse: 76 60 71 71  Temp:  98.2 F (36.8 C)    TempSrc:  Oral    Resp:  18    Height:      Weight:      SpO2:  91% 91% 92%    PHYSICAL EXAM: Right hip/lower; Patient's incisions are clean dry and intact. Her thigh compartments are soft and compressible. There is no significant erythema or ecchymosis. Her lower leg compartment soft and compressible. She has no calf tenderness. She has TED stockings and foot pumps on. She has palpable pedal pulses and she can flex and extend her toes and dorsiflex and plantarflex her ankle.   LABS  Results for orders placed or performed during the hospital encounter of 08/30/15 (from the past 24 hour(s))  CBC     Status: Abnormal   Collection Time: 09/03/15  4:25 AM  Result Value Ref Range   WBC 9.3 3.6 - 11.0 K/uL   RBC 2.26 (L) 3.80 - 5.20 MIL/uL   Hemoglobin 7.8 (L) 12.0 - 16.0 g/dL   HCT 64.3 (L) 32.9 - 51.8 %   MCV 101.3 (H) 80.0 - 100.0 fL   MCH 34.8 (H) 26.0 - 34.0 pg   MCHC 34.3 32.0 - 36.0 g/dL   RDW 84.1 (H) 66.0 - 63.0 %   Platelets 133 (L) 150 - 440 K/uL  Basic metabolic panel     Status: Abnormal   Collection Time: 09/03/15  4:25 AM  Result Value Ref Range   Sodium 138 135 - 145 mmol/L   Potassium 4.2 3.5 - 5.1 mmol/L   Chloride 101 101 - 111 mmol/L   CO2 31 22 - 32 mmol/L   Glucose, Bld 102 (H) 65 - 99 mg/dL   BUN 17 6 - 20 mg/dL   Creatinine, Ser 1.60 0.44 - 1.00 mg/dL   Calcium 8.4 (L) 8.9 - 10.3 mg/dL   GFR calc non Af Amer >60 >60 mL/min   GFR calc Af Amer >60 >60 mL/min   Anion gap 6 5 - 15    No results found.  Assessment/Plan: 3 Days Post-Op   Principal Problem:   Intertrochanteric fracture of right femur  Patient doing well postop. When ready for discharge, the patient will be discharged home with home health.  Patient is hypotensive and has a hemoglobin below 8. I'm ordering transfusion of 1 unit of PRBC. Her CBC should be rechecked to determine if the second unit as necessary. Patient is 04 for discharge home tomorrow.   Juanell Fairly , MD 09/03/2015, 8:38 AM

## 2015-09-03 NOTE — Progress Notes (Signed)
[facility] - [place] at [facility]   PATIENT NAME: [name] [name]    MR#:  [id number]  DATE OF BIRTH:  Jan 14, 1921  SUBJECTIVE:  CHIEF COMPLAINT:   Chief Complaint  Patient presents with  . Fall  Presented after a mechanical fall. Landed on her right side and has right hip fracture. Pain at surgical site. Patient and family have requested we stop Eliquis.  REVIEW OF SYSTEMS:    Review of Systems  Constitutional: Positive for malaise/fatigue. Negative for fever and chills.  HENT: Negative for sore throat.   Eyes: Negative for blurred vision, double vision and pain.  Respiratory: Negative for cough, hemoptysis, shortness of breath and wheezing.   Cardiovascular: Negative for chest pain, palpitations, orthopnea and leg swelling.  Gastrointestinal: Negative for heartburn, nausea, vomiting, abdominal pain, diarrhea and constipation.  Genitourinary: Negative for dysuria and hematuria.  Musculoskeletal: Positive for back pain and joint pain.  Skin: Negative for rash.  Neurological: Negative for sensory change, speech change, focal weakness and headaches.  Endo/Heme/Allergies: Does not bruise/bleed easily.  Psychiatric/Behavioral: Positive for memory loss. Negative for depression. The patient is not nervous/anxious.       DRUG ALLERGIES:   Allergies  Allergen Reactions  . Codeine Nausea And Vomiting  . Morphine Nausea And Vomiting  . Propoxyphene N-Acetaminophen Nausea And Vomiting    VITALS:  Blood pressure 107/53, pulse 79, temperature 97.7 F (36.5 C), temperature source Oral, resp. rate 18, height  (1.575 m), weight 68.312 kg (150 lb 9.6 oz), SpO2 92 %.  PHYSICAL EXAMINATION:   Physical Exam  GENERAL:  81 y.o.-year-old patient lying in the bed with no acute distress.  EYES: Pupils equal, round, reactive to light and accommodation. No scleral icterus. Extraocular muscles intact.  HEENT: Head atraumatic, normocephalic. Oropharynx and  nasopharynx clear.  NECK:  Supple, no jugular venous distention. No thyroid enlargement, no tenderness.  LUNGS: Normal breath sounds bilaterally, no wheezing, rales, rhonchi. No use of accessory muscles of respiration.  CARDIOVASCULAR: S1, S2 normal. No murmurs, rubs, or gallops.  ABDOMEN: Soft, nontender, nondistended. Bowel sounds present. No organomegaly or mass.  EXTREMITIES: No cyanosis, clubbing or edema b/l.   Right hip tenderness and dressing. NEUROLOGIC: Cranial nerves II through XII are intact. No focal Motor or sensory deficits b/l.   PSYCHIATRIC: The patient is alert and oriented x 3.  SKIN: No obvious rash, lesion, or ulcer.    LABORATORY PANEL:   CBC  Recent Labs Lab 09/03/15 0425  WBC 9.3  HGB 7.8*  HCT 22.9*  PLT 133*   ------------------------------------------------------------------------------------------------------------------  Chemistries   Recent Labs Lab 09/03/15 0425  NA 138  K 4.2  CL 101  CO2 31  GLUCOSE 102*  BUN 17  CREATININE 0.68  CALCIUM 8.4*   ------------------------------------------------------------------------------------------------------------------  Cardiac Enzymes No results for input(s): TROPONINI in the last 168 hours. ------------------------------------------------------------------------------------------------------------------  RADIOLOGY:  No results found.   ASSESSMENT AND PLAN:   81 year old Caucasian female history of coronary artery disease paroxysmal atrial fibrillation presenting after mechanical fall  * Right intertrochanteric fracture Restart Eliquis. S/p surgery POD -3 Pain control. Physical therapy. Incentive Spirometer. Monitor for any complications from surgery. We will repeat hemoglobin tomorrow morning.  * Acute blood loss anemia over AOCD Transfuse 1 unit PRBC today due to hypotension and slowly dropping Hb  * GERD without Esophagitis: PPI therapy  * Paroxysmal atrial fibrillation Rate  control meds. Eliquis stopped per patients request.  * Coronary artery disease continue with beta blockade, statin  therapy  All the records are reviewed and case discussed with Care Management/Social Workerr. Management plans discussed with the patient, family and they are in agreement.  CODE STATUS: DNR  DVT Prophylaxis: SCDs  TOTAL TIME TAKING CARE OF THIS PATIENT: 35 minutes.   Discharge to home with home health tomorrow   Milagros Loll R M.D on 09/03/2015 at 1:41 PM  Between 7am to 6pm - Pager - [phone]  After 6pm go to [email or web address] - password [email or web address]  [facility]  Office  [phone]  CC: Primary care physician; Alva Garnet., MD    Note: This dictation was prepared with Dragon dictation along with smaller phrase technology. Any transcriptional errors that result from this process are unintentional.

## 2015-09-03 NOTE — Progress Notes (Signed)
Patient resting in bed, got up to chair with PT. Receiving one unit of blood currently. Family at bedside. Patient received a bath today. Dressing on right hip dry and intact. Teds and Av1's in place. Pain is controlled with PO meds. POD 3. Plan is to go home with home health tomorrow. BM today. Continue to monitor.

## 2015-09-03 NOTE — Progress Notes (Signed)
Dr Martha Clan notified to hold carvedilol, BP low. Dr Elpidio Anis paged for patient family request to hold eliquis, refused last night and this am. Daughter wants eliquis d/c. Dr sudini called back, order placed to d/c eliquis. Daughter notified of discontinued order. Continue to monitor.

## 2015-09-04 DIAGNOSIS — D62 Acute posthemorrhagic anemia: Secondary | ICD-10-CM | POA: Diagnosis present

## 2015-09-04 LAB — URINALYSIS COMPLETE WITH MICROSCOPIC (ARMC ONLY)
Bilirubin Urine: NEGATIVE
GLUCOSE, UA: NEGATIVE mg/dL
Ketones, ur: NEGATIVE mg/dL
LEUKOCYTES UA: NEGATIVE
Nitrite: NEGATIVE
Protein, ur: NEGATIVE mg/dL
SPECIFIC GRAVITY, URINE: 1.012 (ref 1.005–1.030)
SQUAMOUS EPITHELIAL / LPF: NONE SEEN
WBC, UA: NONE SEEN WBC/hpf (ref 0–5)
pH: 5 (ref 5.0–8.0)

## 2015-09-04 LAB — HEMOGLOBIN: HEMOGLOBIN: 9.9 g/dL — AB (ref 12.0–16.0)

## 2015-09-04 NOTE — Progress Notes (Signed)
[facility] - [place] at [facility]   PATIENT NAME: [name] [name]    MR#:  [id number]  DATE OF BIRTH:  [birth year]  SUBJECTIVE:  CHIEF COMPLAINT:   Chief Complaint  Patient presents with  . Fall  Presented after a mechanical fall. Landed on her right side and has right hip fracture. Pain at surgical site. Worked with PT earlier today. Patient and family have requested we stop Eliquis.  REVIEW OF SYSTEMS:    Review of Systems  Constitutional: Positive for malaise/fatigue. Negative for fever and chills.  HENT: Negative for sore throat.   Eyes: Negative for blurred vision, double vision and pain.  Respiratory: Negative for cough, hemoptysis, shortness of breath and wheezing.   Cardiovascular: Negative for chest pain, palpitations, orthopnea and leg swelling.  Gastrointestinal: Negative for heartburn, nausea, vomiting, abdominal pain, diarrhea and constipation.  Genitourinary: Negative for dysuria and hematuria.  Musculoskeletal: Positive for back pain and joint pain.  Skin: Negative for rash.  Neurological: Negative for sensory change, speech change, focal weakness and headaches.  Endo/Heme/Allergies: Does not bruise/bleed easily.  Psychiatric/Behavioral: Positive for memory loss. Negative for depression. The patient is not nervous/anxious.     DRUG ALLERGIES:   Allergies  Allergen Reactions  . Codeine Nausea And Vomiting  . Morphine Nausea And Vomiting  . Propoxyphene N-Acetaminophen Nausea And Vomiting    VITALS:  Blood pressure 123/45, pulse 87, temperature 98.5 F (36.9 C), temperature source Oral, resp. rate 18, height 5\' 2"  (1.575 m), weight 68.312 kg (150 lb 9.6 oz), SpO2 96 %.  PHYSICAL EXAMINATION:   Physical Exam  GENERAL:  81 y.o.-year-old patient lying in the bed with no acute distress.  EYES: Pupils equal, round, reactive to light and accommodation. No scleral icterus. Extraocular muscles intact.  HEENT: Head atraumatic,  normocephalic. Oropharynx and nasopharynx clear.  NECK:  Supple, no jugular venous distention. No thyroid enlargement, no tenderness.  LUNGS: Normal breath sounds bilaterally, no wheezing, rales, rhonchi. No use of accessory muscles of respiration.  CARDIOVASCULAR: S1, S2 normal. No murmurs, rubs, or gallops.  ABDOMEN: Soft, nontender, nondistended. Bowel sounds present. No organomegaly or mass.  EXTREMITIES: No cyanosis, clubbing or edema b/l.   Right hip tenderness and dressing. NEUROLOGIC: Cranial nerves II through XII are intact. No focal Motor or sensory deficits b/l.   PSYCHIATRIC: The patient is alert and oriented x 3.  SKIN: No obvious rash, lesion, or ulcer.    LABORATORY PANEL:   CBC  Recent Labs Lab 09/03/15 0425 09/04/15 0352  WBC 9.3  --   HGB 7.8* 9.9*  HCT 22.9*  --   PLT 133*  --    ------------------------------------------------------------------------------------------------------------------  Chemistries   Recent Labs Lab 09/03/15 0425  NA 138  K 4.2  CL 101  CO2 31  GLUCOSE 102*  BUN 17  CREATININE 0.68  CALCIUM 8.4*   ------------------------------------------------------------------------------------------------------------------  Cardiac Enzymes No results for input(s): TROPONINI in the last 168 hours. ------------------------------------------------------------------------------------------------------------------  RADIOLOGY:  No results found.   ASSESSMENT AND PLAN:   81 year old Caucasian female history of coronary artery disease paroxysmal atrial fibrillation presenting after mechanical fall  * Right intertrochanteric fracture Restart Eliquis. S/p surgery POD - 4 Pain control. Physical therapy. Incentive Spirometer. Monitor for any complications from surgery. D/C in AM home with home health  * Acute blood loss anemia over AOCD Transfused 1 unit PRBC today due to hypotension and slowly dropping Hb on 09/03/2015. Hb  improved  * GERD without Esophagitis: PPI therapy  *  Paroxysmal atrial fibrillation Rate control meds. Held due to hypotension. Will restart. Eliquis stopped per patients request.  * Coronary artery disease continue with beta blockade, statin therapy  All the records are reviewed and case discussed with Care Management/Social Workerr. Management plans discussed with the patient, family and they are in agreement.  CODE STATUS: DNR  DVT Prophylaxis: SCDs  TOTAL TIME TAKING CARE OF THIS PATIENT: 35 minutes.   Discharge to home with home health 09/05/2015   Milagros Loll R M.D on 09/04/2015 at 8:56 AM  Between 7am to 6pm - Pager - [phone]  After 6pm go to [email or web address] - password [email or web address] [facility]  [place] [facility]  Office  [phone]  CC: Primary care physician; Alva Garnet., MD    Note: This dictation was prepared with Dragon dictation along with smaller phrase technology. Any transcriptional errors that result from this process are unintentional.

## 2015-09-04 NOTE — Progress Notes (Signed)
Occupational Therapy Treatment Patient Details Name: [name] [name] MRN: [id number] DOB: Jul 13, 1921 Today's Date: 09/04/2015    History of present illness Patient is a 81 y/o female that presents with R femur fx and femoral rod placement.  Per telephone clarification with Dr. Martha Clan (09/02/15), patient to be PWB R LE, KI not required (but okay to use for safety if lacking knee/quad control)   OT comments  Pt up in chair with daughter and nurse present. Discussed with Pt and daughter home modification, A/E or DME, positioning , ADLs. Pt's daughter said all equipment with be at home and she will assist Pt with ADLs  Follow Up Recommendations  Supervision/Assistance - 24 hour;SNF    Equipment Recommendations  3 in 1 bedside comode    Recommendations for Other Services      Precautions / Restrictions Precautions Precautions: Fall Restrictions Weight Bearing Restrictions: Yes RLE Weight Bearing: Partial weight bearing       Mobility Bed Mobility Overal bed mobility: +2 for physical assistance Bed Mobility: Supine to Sit     Supine to sit: Max assist;+2 for physical assistance     General bed mobility comments: Pt is able to perform some supine bridging while PT assists with drawsheet. ModA to progress RLE, and MaxA for trunk.   Transfers Overall transfer level: Needs assistance Equipment used: Rolling walker (2 wheeled) Transfers: Sit to/from Stand Sit to Stand: Mod assist;From elevated surface         General transfer comment: Consistent posterior weight shift, heavy verbal and tactile cues to bring center of gravity over feet.     Balance Overall balance assessment: Needs assistance Sitting-balance support: Single extremity supported Sitting balance-Leahy Scale: Fair Sitting balance - Comments: Excessive posterio lean, will not brign trunk forward.  Postural control: Posterior lean Standing balance support: Bilateral upper extremity supported Standing  balance-Leahy Scale: Poor Standing balance comment: Requires assistance with weight shift to progress RLE.Constant physical assist to avoid posterior LOB.                    ADL Overall ADL's : Needs assistance/impaired                                      General ADL Comments: Pt needing max assist and constant cues for LB self care/ADLs/ repositioning self in chair to increase comfort .                  Cognition   Behavior During Therapy: Anxious;WFL for tasks assessed/performed Overall Cognitive Status: Within Functional Limits for tasks assessed   Orientation Level: Person;Place;Time;Situation   Memory: Decreased recall of precautions                              Exercises General Exercises - Lower Extremity Short Arc Quad: AAROM;Right;15 reps;Supine Heel Slides: AAROM;15 reps;Right;Supine Hip ABduction/ADduction: AAROM;Right;15 reps;Supine   Shoulder Instructions       General Comments      Pertinent Vitals/ Pain       Pain Assessment: 0-10 Pain Score: no # score give Faces Pain Scale: Hurts whole lot Pain Location: R hip, lower leg allodynic. Pain Intervention(s): Limited activity within patient's tolerance;Monitored during session;Premedicated before session;Patient requesting pain meds-RN notified;Relaxation  Home Living  Pt home with spouse , family/daughter  Prior Functioning/Environment     modified independent with B-ADLs         Frequency Min 1X/week     Progress Toward Goals  OT Goals(current goals can now be found in the care plan section)   slow progress toward treatment goals  Acute Rehab OT Goals Patient Stated Goal: Pt's daughter want to take patient home  Plan   To continue with OT to increase independence in ADLs   Co-evaluation                 End of Session     Activity Tolerance Patient limited by pain;Patient limited by  fatigue   Patient Left in chair;with call bell/phone within reach;with family/visitor present   Nurse Communication          Time: 855- 912    Charges: OT Treatments $Self Care/Home Management : 8-22 mins  Porchea Charrier W 09/04/2015, 9:29 AM

## 2015-09-04 NOTE — Progress Notes (Signed)
Physical Therapy Treatment Patient Details Name: [name] [name] MRN: [id number] DOB: 1920/12/18 Today's Date: 09/04/2015    History of Present Illness Patient is a 81 y/o female that presents with R femur fx and femoral rod placement.  Per telephone clarification with Dr. Martha Clan (09/02/15), patient to be PWB R LE, KI not requried (but okay to use for safety if lacking knee/quad control)    PT Comments    Pt not tolerating treatment session very well. Pt has periods where pain is tolerable, but is very self limiting upon mobility attempts as they worsen pain. Pt not demonstrating significant progress toward goals at this time, as she is fearful, and remains quite weak with all PT activities. Pt's greatest limitation continues to be pain inhibited weakness, which continues to limit ability to perform all mobility tasks at safe level with any indep. Patient presenting with impairment of strength, pain, range of motion, balance, and activity tolerance, limiting ability to perform ADL and mobility tasks at  baseline level of function. Patient will benefit from skilled intervention to address the above impairments and limitations, in order to restore to prior level of function, improve patient safety upon discharge, and to decrease caregiver burden. STR stay remains most logical/benefitial DC recommendation from the rehab perspective, especially in terms of how much assistance the patient requires, however family continues to prepare themselves as full time caregivers for DC home. I have taken the time to begin increasing caregiver education.       Follow Up Recommendations  SNF (Family has all equipment needed to take pt home; will need to figure out navigating 3 steps with WC. Will followup in PM to assure family has standard WC and not a transfer chair. )     Equipment Recommendations  None recommended by PT    Recommendations for Other Services       Precautions / Restrictions  Precautions Precautions: Fall Restrictions Weight Bearing Restrictions: Yes RLE Weight Bearing: Partial weight bearing    Mobility  Bed Mobility Overal bed mobility: +2 for physical assistance Bed Mobility: Supine to Sit     Supine to sit: Max assist;+2 for physical assistance     General bed mobility comments: Pt is able to perform some supine bridging while PT assists with drawsheet. ModA to progress RLE, and MaxA for trunk.   Transfers Overall transfer level: Needs assistance Equipment used: Rolling walker (2 wheeled) Transfers: Sit to/from Stand Sit to Stand: Mod assist;From elevated surface         General transfer comment: Consistent posterior weight shift, heavy verbal and tactile cues to bring center of gravity over feet.   Ambulation/Gait Ambulation/Gait assistance: Mod assist Ambulation Distance (Feet): 5 Feet (Bed to chair.) Assistive device: Rolling walker (2 wheeled) Gait Pattern/deviations: Step-to pattern;Narrow base of support;Decreased weight shift to right;Leaning posteriorly;Antalgic Gait velocity: Pt continues to lean posteriorly, requriing constant physical assistance to avoid posterior LOB; use of BUE for support is variablly sufficient.  Gait velocity interpretation: <1.8 ft/sec, indicative of risk for recurrent falls     Stairs            Wheelchair Mobility    Modified Rankin (Stroke Patients Only)       Balance Overall balance assessment: Needs assistance Sitting-balance support: Single extremity supported Sitting balance-Leahy Scale: Fair Sitting balance - Comments: Excessive posterio lean, will not brign trunk forward.  Postural control: Posterior lean Standing balance support: Bilateral upper extremity supported Standing balance-Leahy Scale: Poor Standing balance comment: Requires assistance with  weight shift to progress RLE.Constant physical assist to avoid posterior LOB.                     Cognition  Arousal/Alertness: Awake/alert Behavior During Therapy: Anxious;WFL for tasks assessed/performed Overall Cognitive Status: Within Functional Limits for tasks assessed   Orientation Level: Person;Place;Time;Situation   Memory: Decreased recall of precautions              Exercises General Exercises - Lower Extremity Short Arc Quad: AAROM;Right;15 reps;Supine Heel Slides: AAROM;15 reps;Right;Supine Hip ABduction/ADduction: AAROM;Right;15 reps;Supine    General Comments        Pertinent Vitals/Pain Pain Assessment: 0-10 Pain Score: 10-Worst pain ever (Pt reporting 20/10 pain. ) Faces Pain Scale: Hurts even more Pain Location: R hip, lower leg allodynic. Pain Intervention(s): Limited activity within patient's tolerance;Monitored during session;Premedicated before session;Patient requesting pain meds-RN notified;Relaxation    Home Living                      Prior Function            PT Goals (current goals can now be found in the care plan section) Acute Rehab PT Goals Patient Stated Goal: Pt's daughter wants to take patient home; pt is indifferent, largely occupied by pain and anxiety.  PT Goal Formulation: With patient/family Time For Goal Achievement: 09/15/15 Potential to Achieve Goals: Fair Progress towards PT goals: Not progressing toward goals - comment    Frequency  BID    PT Plan Current plan remains appropriate    Co-evaluation             End of Session Equipment Utilized During Treatment: Gait belt Activity Tolerance: Patient limited by pain Patient left: in chair;with call bell/phone within reach;with family/visitor present (Family requests that chair alarm remain off. )     Time: 4360-1658 PT Time Calculation (min) (ACUTE ONLY): 28 min  Charges:  $Therapeutic Exercise: 8-22 mins $Therapeutic Activity: 8-22 mins                    G Codes:      Quintez Maselli C 09/25/15, 9:25 AM 9:30 AM  Rosamaria Lints, PT, DPT [place] License  # [id number]

## 2015-09-04 NOTE — Progress Notes (Signed)
Subjective: 4 Days Post-Op Procedure(s) (LRB): INTRAMEDULLARY (IM) NAIL FEMORAL (Right)    Patient reports pain as mild.  Objective:   VITALS:   Filed Vitals:   09/04/15 1553  BP: 100/57  Pulse: 92  Temp: 98.9 F (37.2 C)  Resp: 16    Neurologically intact Sensation intact distally Intact pulses distally Dorsiflexion/Plantar flexion intact No cellulitis present Compartment soft  LABS  Recent Labs  09/01/15 1638 09/02/15 0452 09/03/15 0425 09/04/15 0352  HGB 9.4* 8.4* 7.8* 9.9*  HCT 28.1* 24.2* 22.9*  --   WBC 10.1 9.4 9.3  --   PLT 104* 108* 133*  --      Recent Labs  09/02/15 0452 09/03/15 0425  NA 139 138  K 3.0* 4.2  BUN 12 17  CREATININE 0.67 0.68  GLUCOSE 125* 102*    No results for input(s): LABPT, INR in the last 72 hours.   Assessment/Plan: 4 Days Post-Op Procedure(s) (LRB): INTRAMEDULLARY (IM) NAIL FEMORAL (Right)   Advance diet Up with therapy Discharge to SNF when stable

## 2015-09-04 NOTE — Progress Notes (Signed)
PT Cancellation Note  Patient Details Name: [name] [name] MRN: [id number] DOB: [birth year]   Cancelled Treatment:    Reason Eval/Treat Not Completed: Patient declined, no reason specified;Other (comment);Fatigue/lethargy limiting ability to participate. Pt recently finished hygiene in bed including some bridging and rolling, and reportedly is very worn out and in pain. Pt/ daughter request that pt be allowed to rest at this time. Gave HEP handout to daughter and reviewed in detail. Daughter is agreeable to perform with patient later today in preparation for taking pt home and assisting with HEP performance 3x daily. Will follow up with daughter tomorrow regarding HEP performance. PT will take pt measurements tomorrow in morning for [facility] order, as patient is non-ambulatory and will need a means of entry/exit of home. Discussed with family future training opportunities regarding assisting pt with 3 stairs at home for making subsequent Drs' appointments, etc. Pt will need a letter necessity from physician for [facility] to be fulfilled.    Melah Ebling C 09/04/2015, 1:57 PM  2:01 PM  Rosamaria Lints, PT, DPT [place] License # [id number]

## 2015-09-05 MED ORDER — OXYCODONE HCL 5 MG PO TABS: 5.0000 mg | ORAL_TABLET | ORAL | Status: AC | PRN

## 2015-09-05 MED ORDER — FERROUS SULFATE 325 (65 FE) MG PO TABS: 325.0000 mg | ORAL_TABLET | Freq: Two times a day (BID) | ORAL | Status: AC

## 2015-09-05 NOTE — Progress Notes (Signed)
Completed discharge instructions with daughter and gave prescriptions of Iron and Oxycodone to patients daughter around 12:00 prior to Dr. Hyacinth Meeker coming by.  Discharge order discontinued per his instructions.  Patient has been moaning form pain since getting up to chair with PT.  Patient has been given po pain medications prn and scheduled.

## 2015-09-05 NOTE — Progress Notes (Signed)
Physical Therapy Treatment Patient Details Name: [name] [name] MRN: [id number] DOB: Mar 06, 1921 Today's Date: 09/05/2015    History of Present Illness Patient is a 81 y/o female that presents with R femur fx and femoral rod placement.  Per telephone clarification with Dr. Martha [name] (09/02/15), patient to be PWB R LE, KI not requried (but okay to use for safety if lacking knee/quad control)    PT Comments    Pt continues to have a lot of pain and related anxiety about movement, etc but did show some effort with heavy cuing and encouragement.  She was able to actually ambulate more than just minimal shuffling today, but this required a lot of assist, encouragement, cuing.  Daughter is still adamant about taking the pt home though she realizes that there is a lot of work to do.   Follow Up Recommendations  SNF (daughter wants to take pt home despite obvious limitations) will need HHPT     Equipment Recommendations   (possibly a w/c)    Recommendations for Other Services       Precautions / Restrictions Precautions Precautions: Fall Restrictions Weight Bearing Restrictions: Yes RLE Weight Bearing: Partial weight bearing    Mobility  Bed Mobility Overal bed mobility: Needs Assistance Bed Mobility: Supine to Sit     Supine to sit: Max assist;Mod assist Sit to supine: Mod assist   General bed mobility comments: Pt is able to perform some supine bridging while PT assists with drawsheet. ModA to progress RLE, and MaxA for trunk.   Transfers Overall transfer level: Needs assistance Equipment used: Rolling walker (2 wheeled) Transfers: Sit to/from Stand Sit to Stand: Mod assist         General transfer comment: Pt better able to rise today, but is still fearful, pain focused and generally needs excessive cuing and encouragement  Ambulation/Gait Ambulation/Gait assistance: Mod assist;Max assist;Min assist (inconsistent ) Ambulation Distance (Feet): 15 Feet Assistive device:  Rolling walker (2 wheeled)       General Gait Details: Pt able to take a few steps and then becomes overwhelmed (pain? anxiety?) and needs to sit.  After much motivational cuing from PT and daughter the pt tried again and was able to actaully walk ~10 ft with great effort and needing considerable assist but she was able to actaully ambulate.    Stairs            Wheelchair Mobility    Modified Rankin (Stroke Patients Only)       Balance                                    Cognition Arousal/Alertness: Awake/alert Behavior During Therapy: Anxious;WFL for tasks assessed/performed Overall Cognitive Status: Within Functional Limits for tasks assessed                      Exercises General Exercises - Lower Extremity Ankle Circles/Pumps: AROM;Both;15 reps Quad Sets: Strengthening;10 reps Short Arc QuadBarbaraann [name];Right;Supine;10 reps Heel Slides: AAROM;Right;Supine;10 reps Hip ABduction/ADduction: AAROM;Right;15 reps;Supine Straight Leg Raises: AAROM;5 reps    General Comments        Pertinent Vitals/Pain Pain Score: 9     Home Living                      Prior Function            PT Goals (current goals can now be  found in the care plan section) Progress towards PT goals: Progressing toward goals    Frequency  BID    PT Plan Current plan remains appropriate    Co-evaluation             End of Session Equipment Utilized During Treatment: Gait belt         Time: 3212-2482 PT Time Calculation (min) (ACUTE ONLY): 41 min  Charges:  $Gait Training: 8-22 mins $Therapeutic Exercise: 8-22 mins $Therapeutic Activity: 8-22 mins                    G Codes:      [name] [name] 10-02-15, 1:59 PM

## 2015-09-05 NOTE — Care Management Note (Addendum)
Case Management Note  Patient Details  Name: [name] [name] MRN: [id number] Date of Birth: 1921/09/02  Subjective/Objective:    Referral faxed and called to [facility] for PT. Daughter is requesting a small manual wheelchair for [name] and [facility]-PT also recommends a wheelchair. Request for a wheelchair MD Order was called to Dr Elpidio Anis . Daughter requests to take wheelchair order with her to [facility] store across the street from [facility] tomorrow to pick out the wheelchair size that her Mother needs. Daughter is requesting transport by EMS for [name] to home after discharge today.                Action/Plan:   Expected Discharge Date:  09/03/15               Expected Discharge Plan:     In-House Referral:     Discharge planning Services  CM Consult  Post Acute Care Choice:  Home Health Choice offered to:  Patient, Adult Children  DME Arranged:    DME Agency:     HH Arranged:  PT HH Agency:  [facility]  Status of Service:  In process, will continue to follow  Medicare Important Message Given:  Yes-third notification given Date Medicare IM Given:    Medicare IM give by:    Date Additional Medicare IM Given:    Additional Medicare Important Message give by:     If discussed at Long Length of Stay Meetings, dates discussed:    Additional Comments:  Georgeann Brinkman A, RN 09/05/2015, 9:10 AM

## 2015-09-05 NOTE — Progress Notes (Signed)
[facility] - [place] at [facility]   PATIENT NAME: [name] [name]    MR#:  [id number]  DATE OF BIRTH:  26-Jul-1921  SUBJECTIVE:  CHIEF COMPLAINT:   Chief Complaint  Patient presents with  . Fall  Presented after a mechanical fall. Landed on her right side and has right hip fracture. Pain at surgical site. Worked with PT earlier today with significant pain. Patient and family have requested we stop Eliquis. Discharge cancelled due to uncontrolled pain.  REVIEW OF SYSTEMS:    Review of Systems  Constitutional: Positive for malaise/fatigue. Negative for fever and chills.  HENT: Negative for sore throat.   Eyes: Negative for blurred vision, double vision and pain.  Respiratory: Negative for cough, hemoptysis, shortness of breath and wheezing.   Cardiovascular: Negative for chest pain, palpitations, orthopnea and leg swelling.  Gastrointestinal: Negative for heartburn, nausea, vomiting, abdominal pain, diarrhea and constipation.  Genitourinary: Negative for dysuria and hematuria.  Musculoskeletal: Positive for back pain and joint pain.  Skin: Negative for rash.  Neurological: Negative for sensory change, speech change, focal weakness and headaches.  Endo/Heme/Allergies: Does not bruise/bleed easily.  Psychiatric/Behavioral: Positive for memory loss. Negative for depression. The patient is not nervous/anxious.     DRUG ALLERGIES:   Allergies  Allergen Reactions  . Codeine Nausea And Vomiting  . Morphine Nausea And Vomiting  . Propoxyphene N-Acetaminophen Nausea And Vomiting    VITALS:  Blood pressure 104/42, pulse 74, temperature 97.7 F (36.5 C), temperature source Oral, resp. rate 18, height  (1.575 m), weight 68.312 kg (150 lb 9.6 oz), SpO2 96 %.  PHYSICAL EXAMINATION:   Physical Exam  GENERAL:  81 y.o.-year-old patient lying in the bed with no acute distress.  EYES: Pupils equal, round, reactive to light and accommodation. No scleral  icterus. Extraocular muscles intact.  HEENT: Head atraumatic, normocephalic. Oropharynx and nasopharynx clear.  NECK:  Supple, no jugular venous distention. No thyroid enlargement, no tenderness.  LUNGS: Normal breath sounds bilaterally, no wheezing, rales, rhonchi. No use of accessory muscles of respiration.  CARDIOVASCULAR: S1, S2 normal. No murmurs, rubs, or gallops.  ABDOMEN: Soft, nontender, nondistended. Bowel sounds present. No organomegaly or mass.  EXTREMITIES: No cyanosis, clubbing or edema b/l.   Right hip tenderness and dressing. NEUROLOGIC: Cranial nerves II through XII are intact. No focal Motor or sensory deficits b/l.   PSYCHIATRIC: The patient is alert and oriented x 3.  SKIN: No obvious rash, lesion, or ulcer.    LABORATORY PANEL:   CBC  Recent Labs Lab 09/03/15 0425 09/04/15 0352  WBC 9.3  --   HGB 7.8* 9.9*  HCT 22.9*  --   PLT 133*  --    ------------------------------------------------------------------------------------------------------------------  Chemistries   Recent Labs Lab 09/03/15 0425  NA 138  K 4.2  CL 101  CO2 31  GLUCOSE 102*  BUN 17  CREATININE 0.68  CALCIUM 8.4*   ------------------------------------------------------------------------------------------------------------------  Cardiac Enzymes No results for input(s): TROPONINI in the last 168 hours. ------------------------------------------------------------------------------------------------------------------  RADIOLOGY:  No results found.   ASSESSMENT AND PLAN:   81 year old Caucasian female history of coronary artery disease paroxysmal atrial fibrillation presenting after mechanical fall  * Right intertrochanteric fracture Restart Eliquis. S/p surgery POD - 5 Pain control. Physical therapy. Incentive Spirometer. Monitor for any complications from surgery. Discharge tomorrow home with home health  * Acute blood loss anemia over AOCD Transfused 1 unit PRBC  09/03/2015 due to hypotension and slowly dropping Hb. Hb improved  * GERD  without Esophagitis: PPI therapy  * Paroxysmal atrial fibrillation Rate control meds. Held due to hypotension. Will restart. Eliquis stopped per patients request.  * Coronary artery disease continue with beta blockade, statin therapy  * HTN Vasotec stopped due to low normal BP  All the records are reviewed and case discussed with Care Management/Social Workerr. Management plans discussed with the patient, family and they are in agreement.  CODE STATUS: DNR  DVT Prophylaxis: SCDs  TOTAL TIME TAKING CARE OF THIS PATIENT: 40 minutes.   Discharge to home with home health 09/06/2015   Milagros Loll R M.D on 09/05/2015 at 8:35 PM  Between 7am to 6pm - Pager - [phone]  After 6pm go to [email or web address] - password [email or web address] [facility]  [place] [facility]  Office  [phone]  CC: Primary care physician; Alva Garnet., MD    Note: This dictation was prepared with Dragon dictation along with smaller phrase technology. Any transcriptional errors that result from this process are unintentional.

## 2015-09-05 NOTE — Care Management Note (Signed)
Case Management Note  Patient Details  Name: KARTIER MCQUIRE MRN: [id number] Date of Birth: 1921/11/21  Subjective/Objective:    Not ready for discharge per Dr Hyacinth Meeker. [facility] was notified by this writer that discharge home will be on Monday.                 Action/Plan:   Expected Discharge Date:  09/03/15               Expected Discharge Plan:     In-House Referral:     Discharge planning Services  CM Consult  Post Acute Care Choice:  Home Health Choice offered to:  Patient, Adult Children  DME Arranged:    DME Agency:     HH Arranged:  PT HH Agency:  [facility]  Status of Service:  In process, will continue to follow  Medicare Important Message Given:  Yes-fourth notification given Date Medicare IM Given:    Medicare IM give by:    Date Additional Medicare IM Given:    Additional Medicare Important Message give by:     If discussed at Long Length of Stay Meetings, dates discussed:    Additional Comments:  Jakai Onofre A, RN 09/05/2015, 4:35 PM

## 2015-09-05 NOTE — Progress Notes (Signed)
Subjective: 5 Days Post-Op Procedure(s) (LRB): INTRAMEDULLARY (IM) NAIL FEMORAL (Right)    Patient reports pain as moderate. Does not feel well today. Some nausea.  Walked a little today.  Since she is going home I think we will keep her until tomorrow,.  Objective:   VITALS:   Filed Vitals:   09/05/15 0745  BP: 105/64  Pulse: 82  Temp: 98.9 F (37.2 C)  Resp: 18    Neurologically intact Neurovascular intact Sensation intact distally Intact pulses distally Dorsiflexion/Plantar flexion intact Incision: no drainage  LABS  Recent Labs  09/03/15 0425 09/04/15 0352  HGB 7.8* 9.9*  HCT 22.9*  --   WBC 9.3  --   PLT 133*  --      Recent Labs  09/03/15 0425  NA 138  K 4.2  BUN 17  CREATININE 0.68  GLUCOSE 102*    No results for input(s): LABPT, INR in the last 72 hours.   Assessment/Plan: 5 Days Post-Op Procedure(s) (LRB): INTRAMEDULLARY (IM) NAIL FEMORAL (Right)   Up with therapy Plan for discharge tomorrow

## 2015-09-06 LAB — URINE CULTURE: CULTURE: NO GROWTH

## 2015-09-06 NOTE — Discharge Summary (Signed)
[facility] - [place] at [facility]   PATIENT NAME: [name] [name]    MR#:  [id number]  DATE OF BIRTH:  1921-05-01  DATE OF ADMISSION:  08/30/2015 ADMITTING PHYSICIAN: Wyatt Haste, MD  DATE OF DISCHARGE: 09/06/2015   PRIMARY CARE PHYSICIAN: Alva Garnet., MD    ADMISSION DIAGNOSIS:  Closed right hip fracture, initial encounter (HCC) [S72.001A]  DISCHARGE DIAGNOSIS:  Principal Problem:   Intertrochanteric fracture of right femur (HCC) Active Problems:   Acute blood loss anemia   SECONDARY DIAGNOSIS:   Past Medical History  Diagnosis Date  . Pacemaker     Biventricular, [employer]   . CAD in native artery   . LBBB (left bundle branch block)   . Chronic systolic heart failure   . Cardiomyopathy, primary   . Hypertension   . Hyperlipidemia   . Urticaria   . Urinary incontinence   . Osteoarthritis   . Spinal stenosis, lumbar     HOSPITAL COURSE:  81 year old Caucasian female history of coronary artery disease paroxysmal atrial fibrillation presenting after mechanical fall  * Right intertrochanteric fracture: Patient is postop day #6. Physical therapy recommended skilled nursing facility however family would like to bring her home with home healthcare. Patient will be discharged home with home health care with pain medication.  * Acute blood loss anemia over AOCD Transfused 1 unit PRBC 09/03/2015 due to hypotension and slowly dropping Hb. Hb improved  * GERD without Esophagitis: Continue PPI therapy  * Paroxysmal atrial fibrillation: Heart rate is controlled. Eliquis stopped per patients request.  * Coronary artery disease continue with beta blocker, statin therapy  * HTN: Patient may resume outpatient medications   DISCHARGE CONDITIONS AND DIET:  Patient is being discharged home in stable condition on a heart healthy diet  CONSULTS OBTAINED:  Treatment Team:  Wyatt Haste, MD Juanell Fairly, MD  DRUG ALLERGIES:    Allergies  Allergen Reactions  . Codeine Nausea And Vomiting  . Morphine Nausea And Vomiting  . Propoxyphene N-Acetaminophen Nausea And Vomiting    DISCHARGE MEDICATIONS:   Current Discharge Medication List    START taking these medications   Details  ferrous sulfate 325 (65 FE) MG tablet Take 1 tablet (325 mg total) by mouth 2 (two) times daily with a meal. Qty: 60 tablet, Refills: 0    oxyCODONE (ROXICODONE) 5 MG immediate release tablet Take 1 tablet (5 mg total) by mouth every 4 (four) hours as needed for severe pain. Qty: 30 tablet, Refills: 0      CONTINUE these medications which have NOT CHANGED   Details  carvedilol (COREG) 3.125 MG tablet Take 1 tablet (3.125 mg total) by mouth 2 (two) times daily with a meal. Qty: 180 tablet, Refills: 1    Cholecalciferol (VITAMIN D3) 1000 UNITS tablet Take 1,000 Units by mouth daily.      digoxin (LANOXIN) 0.125 MG tablet Take 0.125 mg by mouth at bedtime.    esomeprazole (NEXIUM) 20 MG capsule Take 20 mg by mouth daily.      ezetimibe-simvastatin (VYTORIN) 10-40 MG per tablet Take 1 tablet by mouth at bedtime. Qty: 90 tablet, Refills: 3    furosemide (LASIX) 80 MG tablet Take 1 tablet (80 mg total) by mouth daily. Qty: 90 tablet, Refills: 4    isosorbide mononitrate (IMDUR) 30 MG 24 hr tablet Take 30 mg by mouth at bedtime.    latanoprost (XALATAN) 0.005 % ophthalmic solution Place 1 drop into both eyes at bedtime.  nitroGLYCERIN (NITROSTAT) 0.4 MG SL tablet Place 1 tablet (0.4 mg total) under the tongue every 5 (five) minutes as needed for chest pain. Qty: 25 tablet, Refills: 3    oxyCODONE-acetaminophen (PERCOCET/ROXICET) 5-325 MG per tablet Take 1 tablet by mouth 3 (three) times daily.     potassium chloride SA (K-DUR,KLOR-CON) 20 MEQ tablet Take 1 tablet (20 mEq total) by mouth daily. Qty: 90 tablet, Refills: 4    promethazine (PHENERGAN) 25 MG tablet Take 25 mg by mouth every 6 (six) hours as needed for nausea  or vomiting.    vitamin B-12 (CYANOCOBALAMIN) 1000 MCG tablet Take 1,000 mcg by mouth 2 (two) times daily.      STOP taking these medications     apixaban (ELIQUIS) 2.5 MG TABS tablet      enalapril (VASOTEC) 5 MG tablet               Today   CHIEF COMPLAINT:  Patient is doing well this morning pain is better controlled   VITAL SIGNS:  Blood pressure 113/54, pulse 72, temperature 97.5 F (36.4 C), temperature source Oral, resp. rate 16, height  (1.575 m), weight 68.312 kg (150 lb 9.6 oz), SpO2 91 %.   REVIEW OF SYSTEMS:  Review of Systems  Constitutional: Negative for fever, chills and malaise/fatigue.  HENT: Negative for sore throat.   Eyes: Negative for blurred vision.  Respiratory: Negative for cough, hemoptysis, shortness of breath and wheezing.   Cardiovascular: Negative for chest pain, palpitations and leg swelling.  Gastrointestinal: Negative for nausea, vomiting, abdominal pain, diarrhea and blood in stool.  Genitourinary: Negative for dysuria.  Musculoskeletal: Negative for back pain.  Neurological: Negative for dizziness, tremors and headaches.  Endo/Heme/Allergies: Does not bruise/bleed easily.     PHYSICAL EXAMINATION:  GENERAL:  81 y.o.-year-old patient lying in the bed with no acute distress.  NECK:  Supple, no jugular venous distention. No thyroid enlargement, no tenderness.  LUNGS: Normal breath sounds bilaterally, no wheezing, rales,rhonchi  No use of accessory muscles of respiration.  CARDIOVASCULAR: S1, S2 normal. No murmurs, rubs, or gallops.  ABDOMEN: Soft, non-tender, non-distended. Bowel sounds present. No organomegaly or mass.  EXTREMITIES: No pedal edema, cyanosis, or clubbing.  PSYCHIATRIC: The patient is alert and oriented  SKIN: No obvious rash, lesion, or ulcer. Right hip mild tenderness  DATA REVIEW:   CBC  Recent Labs Lab 09/03/15 0425 09/04/15 0352  WBC 9.3  --   HGB 7.8* 9.9*  HCT 22.9*  --   PLT 133*  --      Chemistries   Recent Labs Lab 09/03/15 0425  NA 138  K 4.2  CL 101  CO2 31  GLUCOSE 102*  BUN 17  CREATININE 0.68  CALCIUM 8.4*    Cardiac Enzymes No results for input(s): TROPONINI in the last 168 hours.  Microbiology Results  @  RADIOLOGY:  No results found.    Management plans discussed with the patient and she is in agreement. Stable for discharge home with home health care  Patient should follow up with orthopedics in one week  CODE STATUS:     Code Status Orders        Start     Ordered   08/31/15 1648  Do not attempt resuscitation (DNR)   Continuous    Question Answer Comment  In the event of cardiac or respiratory ARREST Do not call a "code blue"   In the event of cardiac or respiratory ARREST Do not perform Intubation,  CPR, defibrillation or ACLS   In the event of cardiac or respiratory ARREST Use medication by any route, position, wound care, and other measures to relive pain and suffering. May use oxygen, suction and manual treatment of airway obstruction as needed for comfort.      08/31/15 1647    Advance Directive Documentation        Most Recent Value   Type of Advance Directive  Living will   Pre-existing out of facility DNR order (yellow form or pink MOST form)     "MOST" Form in Place?        TOTAL TIME TAKING CARE OF THIS PATIENT: 35 minutes.    Lazariah Savard M.D on 09/06/2015 at 11:09 AM  Between 7am to 6pm - Pager - [phone] After 6pm go to [email or web address] - password [email or web address] [facility]  [place] [facility]  Office  [phone]  CC: Primary care physician; Alva Garnet., MD

## 2015-09-06 NOTE — Progress Notes (Signed)
Patient daughter at bedside was concerned about patient being  incontinent and nursing staff asked about bathing. Patient daughter upset states" that's crazy who gets baths at 0200 am".Patient teaching giving to patient and family about peri care. Pain controlled with PRN and scheduled medication. Patient daughter upset about nursing staff obtaining vitals at scheduled time. Per family requested that when patient is asleep to let her rest. 0530 went to administer pain medication, and patient was sleeping.

## 2015-09-06 NOTE — Progress Notes (Signed)
Physical Therapy Treatment Patient Details Name: [name] [name] MRN: [id number] DOB: 1921/10/13 Today's Date: 09/06/2015    History of Present Illness Patient is a 81 y/o female that presents with R femur fx and femoral rod placement.  Per telephone clarification with Dr. Martha Clan (09/02/15), patient to be PWB R LE, KI not requried (but okay to use for safety if lacking knee/quad control)    PT Comments    Initial 2 attempts nursing with patient and required some time before PT. Pt seen on third attempt; daughter present for treatment. Pt demonstrated understanding of exercises with education and assist. Pt requires Mod A for bed mobility and to stand and continues to demonstrate safety concerns requiring constant cueing. Pt able to ambulate with compliance with PWB status on R with education before activity. Pt does fatigue quickly and is anxious with ambulation. Requires cueing for proper sequence and safe maneuvering/distance of rolling walker for optimal balance. Daughter feels confident she can manage pt at home and has necessary equipment to care for her; pt is to receive wheelchair and daughter notes she will have assist to manage wheelchair up/down 3 steps to get in and out of the home for appointments. PT recommendation continues to be skilled nursing facility due to increased assist pt requires for all functional mobility, decreased ambulation distance and quality as well as decreased safety awareness all making patient an increased fall risk.   Follow Up Recommendations  SNF (daughter to take pt home; feels confident she can manage)     Equipment Recommendations  None recommended by PT    Recommendations for Other Services       Precautions / Restrictions Precautions Precautions: Fall Restrictions Weight Bearing Restrictions: Yes RLE Weight Bearing: Partial weight bearing    Mobility  Bed Mobility Overal bed mobility: Needs Assistance Bed Mobility: Supine to Sit      Supine to sit: Mod assist     General bed mobility comments: Requires assist with LEs and trunk  Transfers Overall transfer level: Needs assistance Equipment used: Rolling walker (2 wheeled) Transfers: Sit to/from Stand Sit to Stand: Mod assist         General transfer comment: Requires safety cueing for hands to stand and sit; assist for balance initially; slow to rise; education on R PWB  Ambulation/Gait Ambulation/Gait assistance: Min assist Ambulation Distance (Feet): 34 Feet Assistive device: Rolling walker (2 wheeled) Gait Pattern/deviations: Step-to pattern;Decreased stride length;Leaning posteriorly;Trunk flexed (Rigid) Gait velocity: decreased Gait velocity interpretation: <1.8 ft/sec, indicative of risk for recurrent falls General Gait Details: Needs assist maneuvering rw and keeping correct distance between body and rw, as she steps to far into rw despite constant cueing; assist to move rw further out before stepping and stepping only to hands; inconsistent at best. Anxious with ambulation/moans worrisomely    Stairs Stairs:  (daughter notes pt to go home by EMS; w/c to go in/out home)          Wheelchair Mobility    Modified Rankin (Stroke Patients Only)       Balance           Standing balance support: Bilateral upper extremity supported Standing balance-Leahy Scale: Poor                      Cognition Arousal/Alertness: Awake/alert Behavior During Therapy: Anxious;WFL for tasks assessed/performed Overall Cognitive Status: Within Functional Limits for tasks assessed  Exercises General Exercises - Lower Extremity Ankle Circles/Pumps: AROM;Both;Supine;20 reps Quad Sets: Strengthening;10 reps;Both;Supine Gluteal Sets: Strengthening;Both;10 reps;Supine Short Arc Quad: AROM;Both;10 reps;Supine Long Arc Quad: AROM;Both;10 reps;Seated Heel Slides: AAROM;Right;Supine;10 reps (resistive range of motion 10x  left) Hip ABduction/ADduction: AAROM;Right;Supine;10 reps (Resistive range of motion 10x L) Straight Leg Raises: AAROM;Right;10 reps;Supine (AROM 10x left)    General Comments        Pertinent Vitals/Pain Pain Assessment: 0-10 Pain Score: 8  Pain Location: R hip/LE Pain Descriptors / Indicators: Constant;Aching;Moaning;Grimacing Pain Intervention(s): Monitored during session;RN gave pain meds during session;Repositioned    Home Living                      Prior Function            PT Goals (current goals can now be found in the care plan section) Progress towards PT goals: Progressing toward goals    Frequency  BID    PT Plan Current plan remains appropriate    Co-evaluation             End of Session Equipment Utilized During Treatment: Gait belt Activity Tolerance: Patient tolerated treatment well;Patient limited by fatigue;No increased pain Patient left: in chair;with family/visitor present (daughter refused chair alarm or phone by patient)     Time: 2761-4709 PT Time Calculation (min) (ACUTE ONLY): 47 min  Charges:  $Gait Training: 8-22 mins $Therapeutic Exercise: 8-22 mins $Therapeutic Activity: 8-22 mins                    G Codes:      Kristeen Miss 09/06/2015, 11:23 AM

## 2015-09-06 NOTE — Progress Notes (Signed)
Patient discharged home. Instruction given, verbalized understanding. Daughter at bedside. IV removed. Transporting by EMS. EMS called, waiting on transportation.

## 2015-09-12 LAB — TYPE AND SCREEN
ABO/RH(D): A POS
Antibody Screen: NEGATIVE
DONOR AG TYPE: NEGATIVE
UNIT DIVISION: 0
Unit division: 0
Unit division: 0

## 2015-09-13 ENCOUNTER — Other Ambulatory Visit: Payer: Self-pay | Admitting: Cardiology

## 2015-10-05 ENCOUNTER — Encounter: Payer: Self-pay | Admitting: *Deleted

## 2015-10-15 ENCOUNTER — Other Ambulatory Visit: Payer: Self-pay | Admitting: Cardiology

## 2015-11-22 ENCOUNTER — Other Ambulatory Visit: Payer: Self-pay | Admitting: Cardiology

## 2015-11-30 ENCOUNTER — Encounter: Payer: Self-pay | Admitting: *Deleted

## 2015-12-07 ENCOUNTER — Telehealth: Payer: Self-pay | Admitting: Internal Medicine

## 2015-12-07 NOTE — Telephone Encounter (Signed)
Patient is OD for device clinic.  Does not want [facility] appt. Is there anywhere we can fit her in for check in [place].   OV/physcheck for [name] [name] will be due in April

## 2015-12-07 NOTE — Telephone Encounter (Signed)
Called pt- she was not aware she had called to schedule device appt. She would prefer to be called tomorrow to schedule. Offered 0830 appt Jan 10 or 0900 on Jan 17th for pacer check in [place].

## 2015-12-21 ENCOUNTER — Encounter: Payer: Self-pay | Admitting: Internal Medicine

## 2015-12-21 ENCOUNTER — Ambulatory Visit (INDEPENDENT_AMBULATORY_CARE_PROVIDER_SITE_OTHER): Payer: Medicare Other | Admitting: *Deleted

## 2015-12-21 DIAGNOSIS — I5022 Chronic systolic (congestive) heart failure: Secondary | ICD-10-CM | POA: Diagnosis not present

## 2015-12-21 DIAGNOSIS — I4891 Unspecified atrial fibrillation: Secondary | ICD-10-CM

## 2015-12-21 DIAGNOSIS — Z45018 Encounter for adjustment and management of other part of cardiac pacemaker: Secondary | ICD-10-CM

## 2015-12-21 LAB — CUP PACEART INCLINIC DEVICE CHECK
Brady Statistic RA Percent Paced: 13 %
Implantable Lead Implant Date: 20050131
Implantable Lead Location: 753858
Implantable Lead Location: 753859
Implantable Lead Location: 753860
Implantable Lead Model: 158
Implantable Lead Serial Number: 150366
Implantable Lead Serial Number: 307913
Lead Channel Impedance Value: 522 Ohm
Lead Channel Pacing Threshold Amplitude: 2.4 V
Lead Channel Pacing Threshold Pulse Width: 1 ms
Lead Channel Sensing Intrinsic Amplitude: 6.1 mV
Lead Channel Setting Pacing Pulse Width: 1 ms
Lead Channel Setting Sensing Sensitivity: 2.5 mV
Lead Channel Setting Sensing Sensitivity: 2.5 mV
MDC IDC LEAD IMPLANT DT: 20050131
MDC IDC LEAD IMPLANT DT: 20050131
MDC IDC LEAD MODEL: 4518
MDC IDC MSMT LEADCHNL LV IMPEDANCE VALUE: 2500 Ohm — AB
MDC IDC MSMT LEADCHNL RA IMPEDANCE VALUE: 419 Ohm
MDC IDC MSMT LEADCHNL RA SENSING INTR AMPL: 1.9 mV
MDC IDC SESS DTM: 20170117050000
MDC IDC SET LEADCHNL RV PACING AMPLITUDE: 2.4 V
MDC IDC STAT BRADY RV PERCENT PACED: 82 %
Pulse Gen Serial Number: 107959

## 2015-12-21 NOTE — Progress Notes (Signed)
CRT-P device check in clinic. Normal device function. Sensing and impedance consistent with previous measurements. RV threshold elevated at 2.4V @ 1.37ms (last check 06/2015--was 1.8V @ 1.29ms). Histograms appropriate for patient and level of activity. 28% mode switched, Eliquis d/c per daughter, recent fall with hip fracture. 3 ventricular high rate episodes, no EGMs. LV lead reprogrammed off and output/pulse width reduced to lowest settings, hx LV lead fracture. Device programmed with appropriate safety margins; RV output reprogrammed to 3.5V @ 1.76ms. Device heart failure diagnostics are within normal limits and stable over time. Estimated longevity 2.5 years. ROV with SK/B in 03/2016.

## 2016-01-15 ENCOUNTER — Other Ambulatory Visit: Payer: Self-pay | Admitting: Cardiology

## 2016-02-10 ENCOUNTER — Other Ambulatory Visit: Payer: Self-pay | Admitting: Cardiology

## 2016-02-10 NOTE — Telephone Encounter (Signed)
Rx(s) sent to pharmacy electronically.  

## 2016-02-19 ENCOUNTER — Other Ambulatory Visit: Payer: Self-pay | Admitting: Cardiology

## 2016-03-03 ENCOUNTER — Other Ambulatory Visit: Payer: Self-pay | Admitting: Cardiology

## 2016-03-06 ENCOUNTER — Telehealth: Payer: Self-pay | Admitting: Internal Medicine

## 2016-03-06 NOTE — Telephone Encounter (Signed)
Patient "not going to be here much longer" per family and fu is not needed.  Deleting recall.

## 2016-03-09 ENCOUNTER — Other Ambulatory Visit: Payer: Self-pay | Admitting: Cardiology

## 2016-04-03 DEATH — deceased

## 2016-05-26 IMAGING — CR DG CHEST 1V PORT
1 series · 1 of 1 positions shown · non-contrast
Comparison: 09/13/2014.

CLINICAL DATA: Initial encounter for preoperative respiratory
evaluation for hip fracture.

EXAM:
PORTABLE CHEST 1 VIEW

[chest ap]
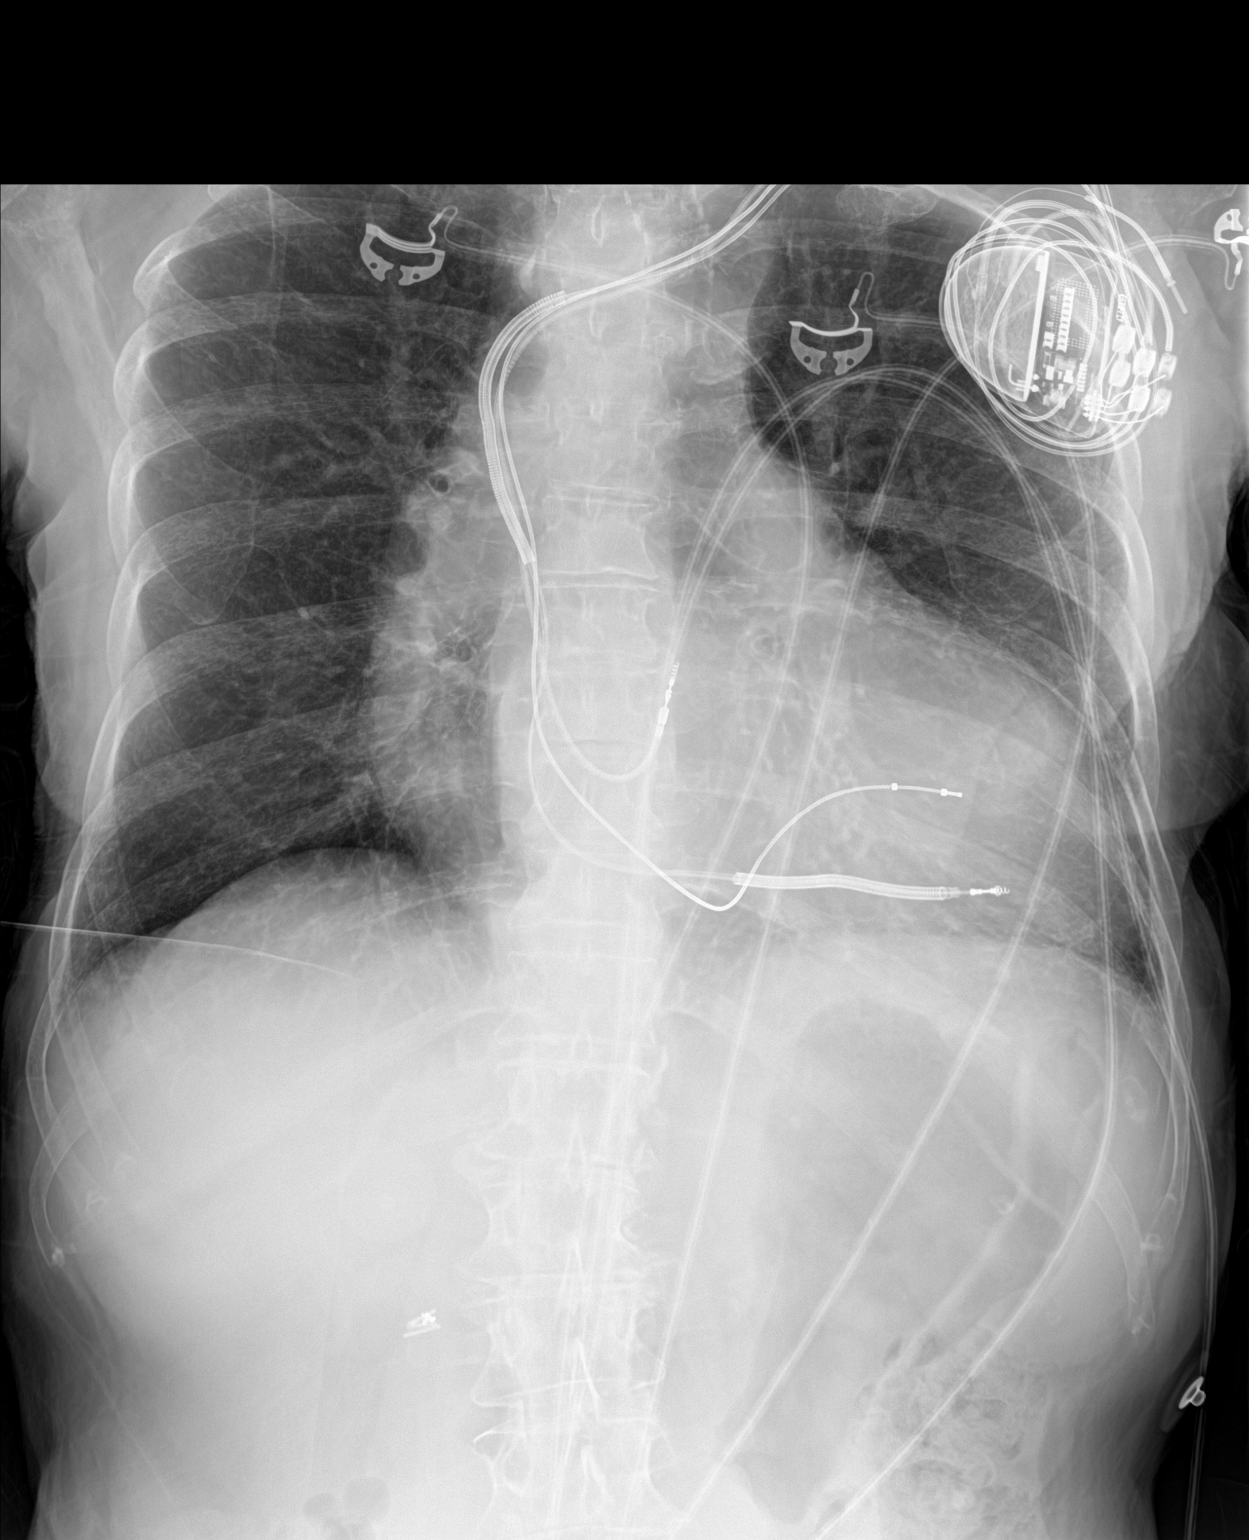

[1 of 1 positions shown; findings below may reference images not displayed]

FINDINGS: The lungs are clear without focal pneumonia, edema, pneumothorax or
pleural effusion. The cardio pericardial silhouette is enlarged.
Left sided permanent pacemaker/ AICD remains in place. Telemetry
leads overlie the chest. Bones are diffusely demineralized.
IMPRESSION: Stable exam. Cardiomegaly without acute cardiopulmonary findings in
this patient with known right-sided aortic arch.

## 2016-05-26 IMAGING — CT CT HIP*R* W/O CM
1 of 2 series · 13 of 32 positions shown, 19 images · non-contrast
Comparison: Radiographs earlier this day, concerning for
intertrochanteric fracture.

CLINICAL DATA: [AGE] female post unwitnessed fall outside
today, now with right hip pain. History of prior right hip surgery.

EXAM:
CT OF THE RIGHT HIP WITHOUT CONTRAST
TECHNIQUE: Multidetector CT imaging of the right hip was performed according to
the standard protocol. Multiplanar CT image reconstructions were
also generated.

[Series 14: soft tissue axials- · axial · 0.32mm/px · z∈[-302,-200]mm · 13 of 61 slices shown, 19 images]
[im 5/61  soft-tissue]
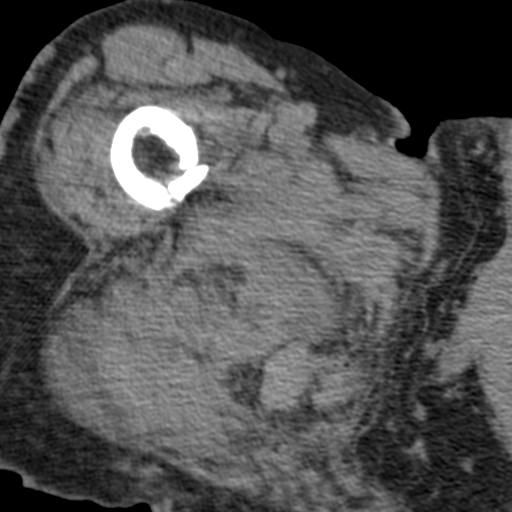
[im 5/61  bone]
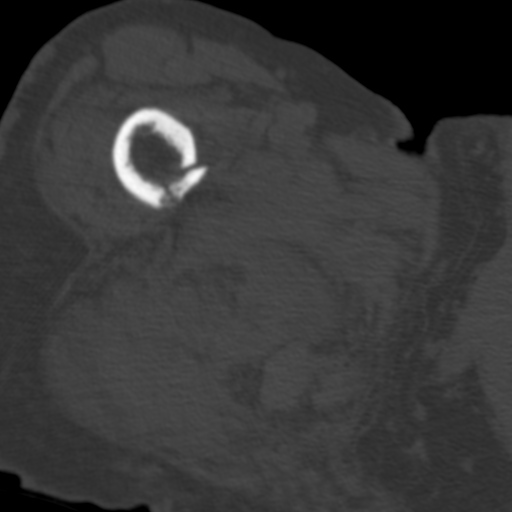
[im 9/61  soft-tissue]
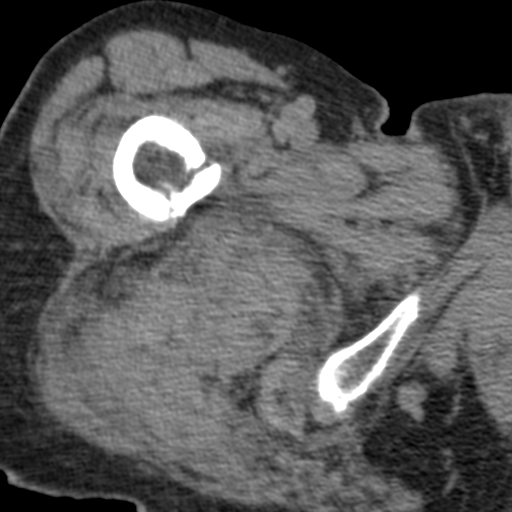
[im 13/61  soft-tissue]
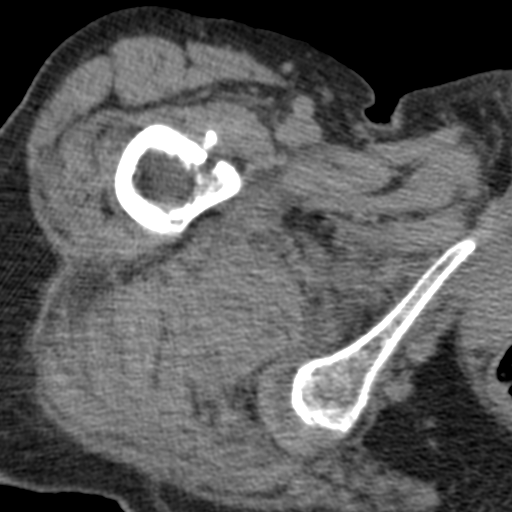
[im 18/61  soft-tissue]
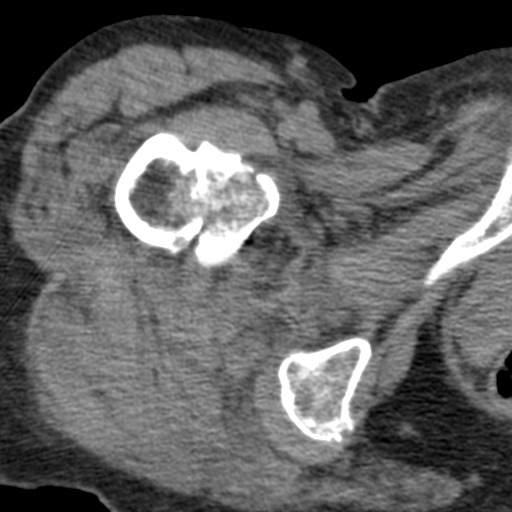
[im 22/61  soft-tissue]
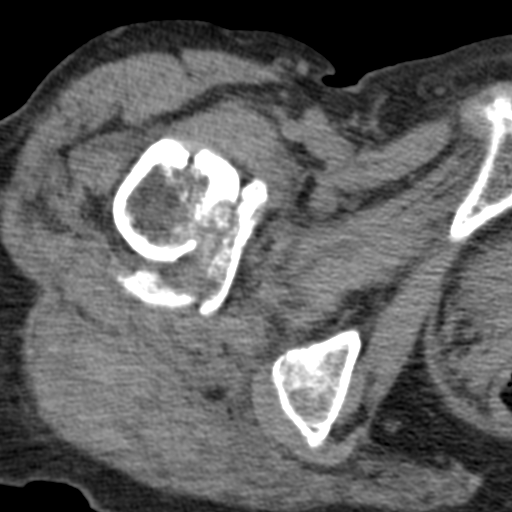
[im 26/61  soft-tissue]
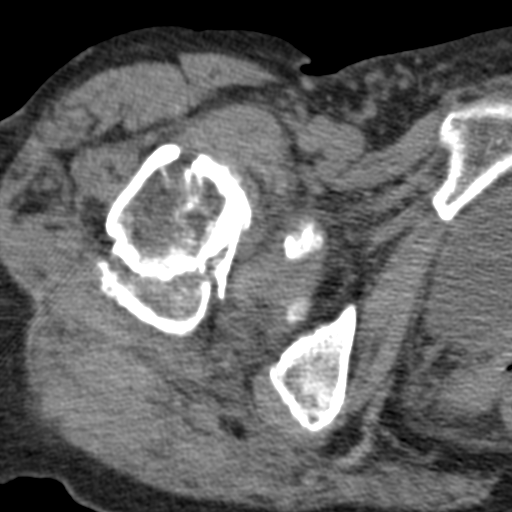
[im 31/61  soft-tissue]
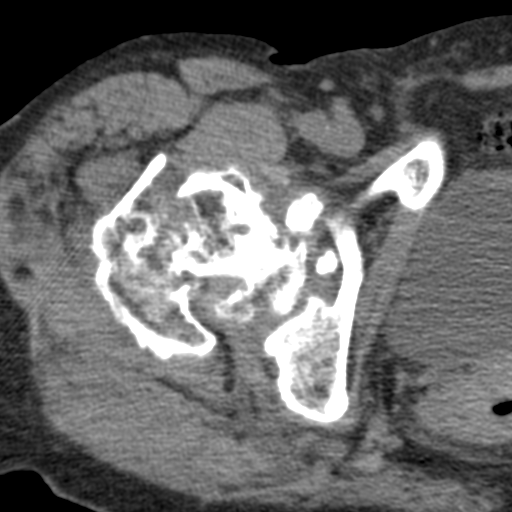
[im 35/61  soft-tissue]
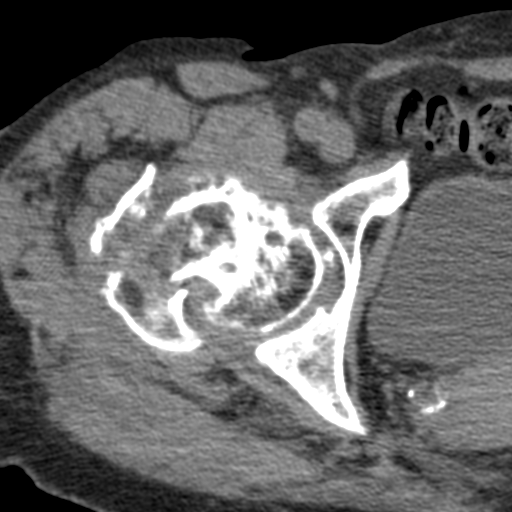
[im 39/61  soft-tissue]
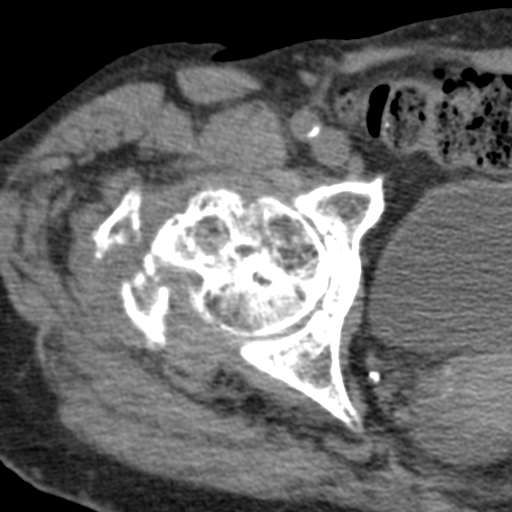
[im 39/61  bone]
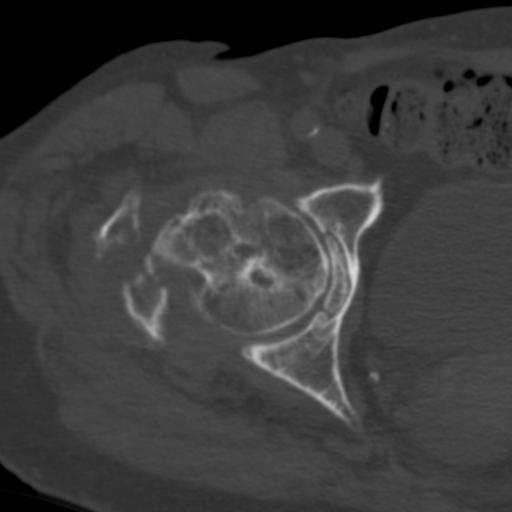
[im 43/61  soft-tissue]
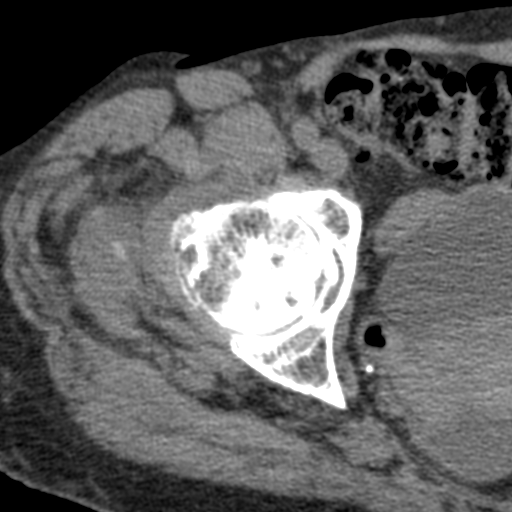
[im 43/61  lung]
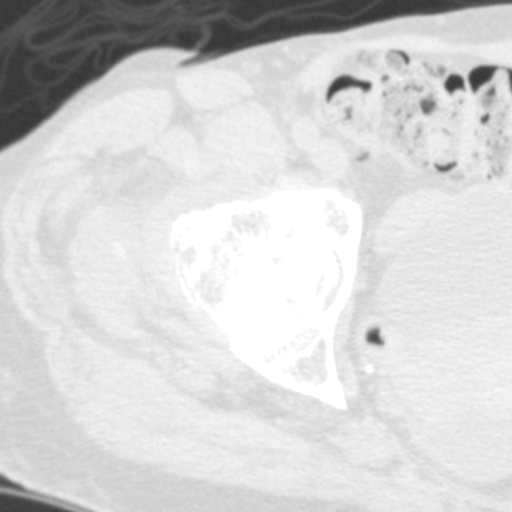
[im 48/61  soft-tissue]
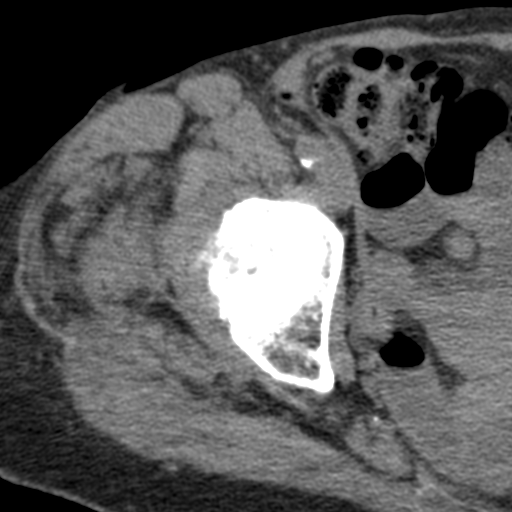
[im 48/61  lung]
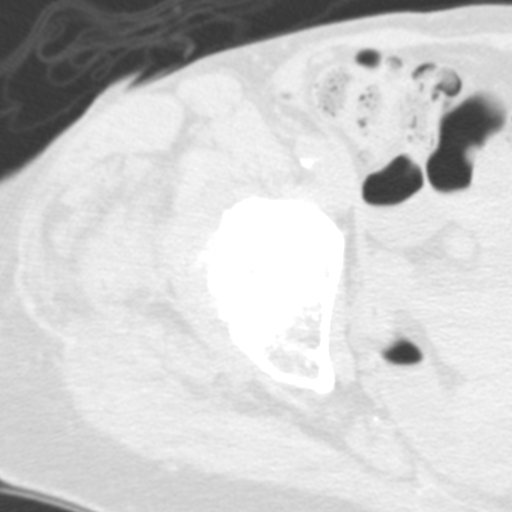
[im 52/61  soft-tissue]
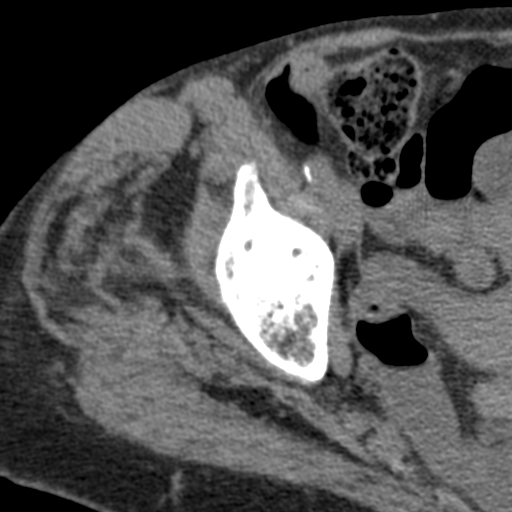
[im 52/61  lung]
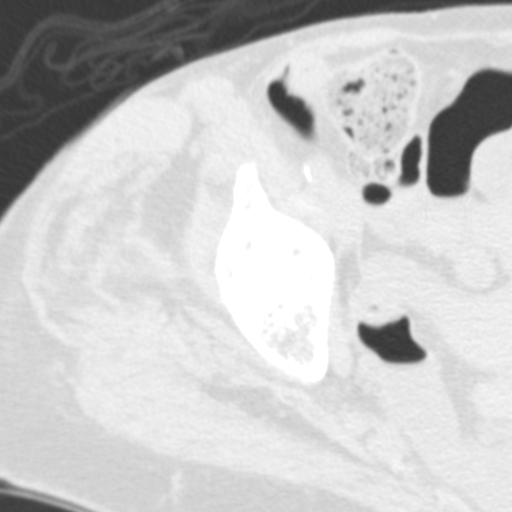
[im 56/61  soft-tissue]
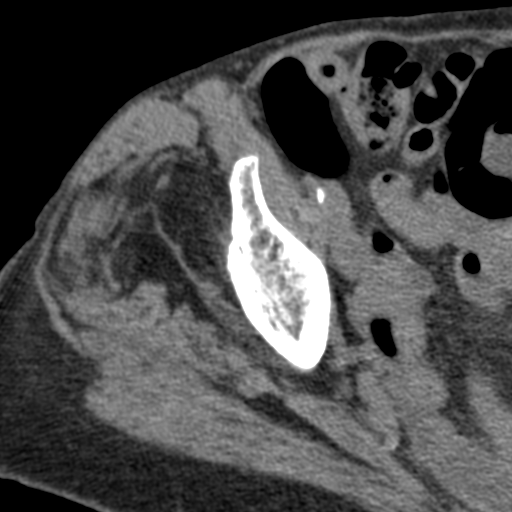
[im 56/61  lung]
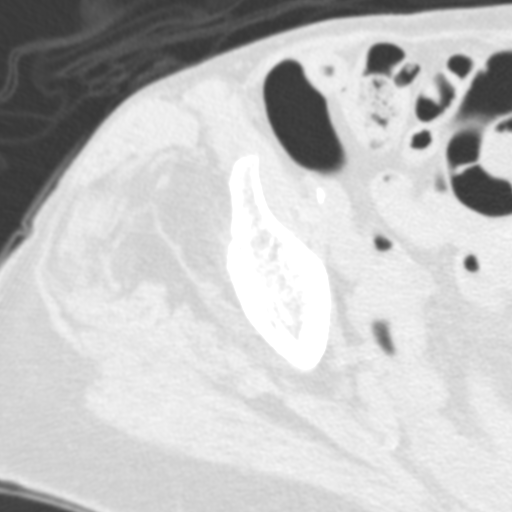

[13 of 32 positions shown; findings below may reference images not displayed]

FINDINGS: Comminuted fracture of the intertrochanteric right hip with
displacement involving the greater and lesser trochanters. Fracture
extends to the base of the femoral neck. There is subtrochanteric
extension about the medial cortex. There ghost tracks with femoral
neck from prior surgery. Severe osteoarthritis of the right hip with
complete joint space loss, subchondral cystic change in scleroses.
Small intra-articular bodies seen inferior medially in the joint
space. In cuboid acetabulum pubic rami are intact without additional
fracture. Probable hematoma tracking in the medial upper thigh
musculature, partially included.
IMPRESSION: 1. Comminuted displaced intertrochanteric right hip fracture
extending to the base of femoral neck. There is subtrochanteric
involvement involving the medial cortex.
2. Advanced osteoarthritis of the right hip. Postsurgical change
with ghost tracks to the femoral neck.

## 2016-05-26 IMAGING — CR DG HIP (WITH OR WITHOUT PELVIS) 2-3V*R*
3 series · 3 of 3 positions shown · non-contrast
Comparison: None.

CLINICAL DATA: Fall. Remote history of RIGHT hip fracture. RIGHT
hip pain. Initial encounter.

EXAM:
DG HIP (WITH OR WITHOUT PELVIS) 2-3V RIGHT

[pelvis ap]
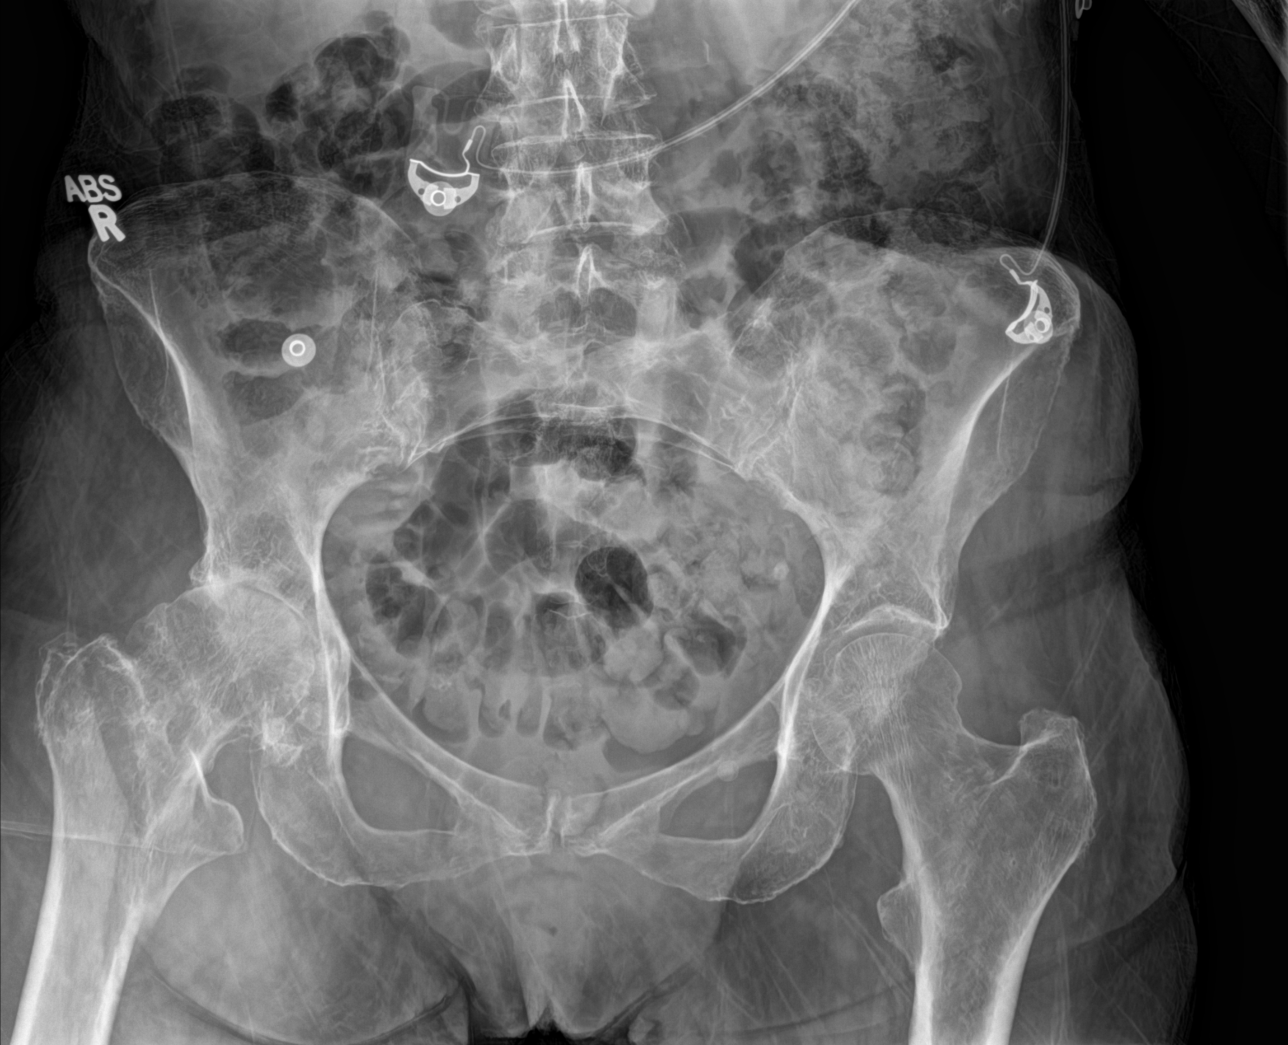

[hip ap]
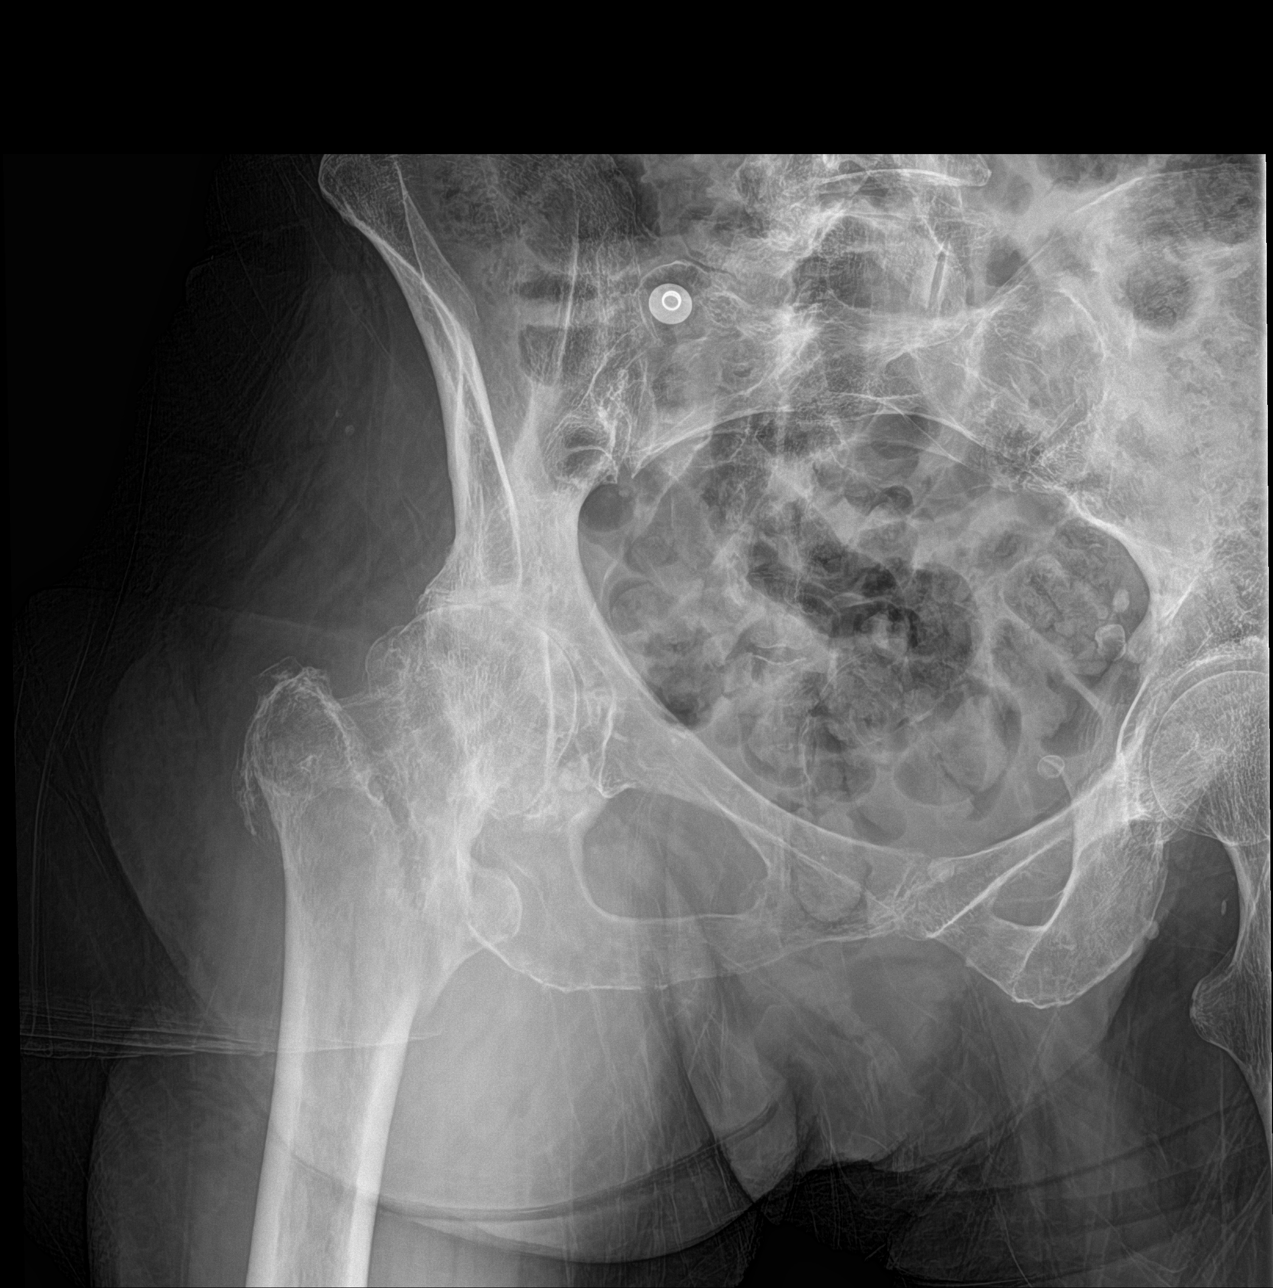

[hip lat]
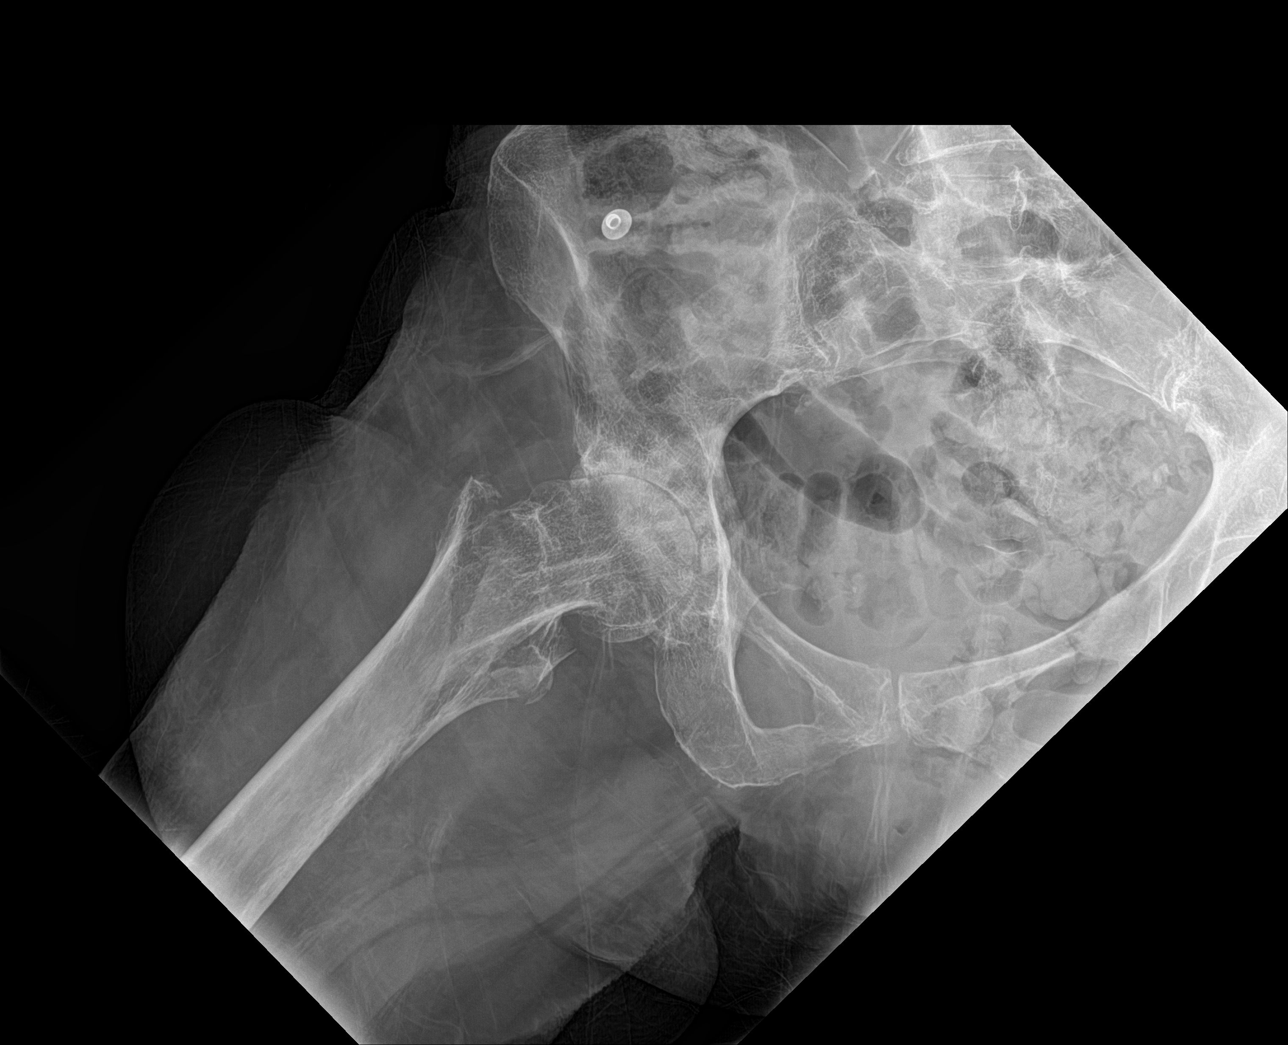

[3 of 3 positions shown; findings below may reference images not displayed]

FINDINGS: There is no comparison available to assess for interval changes.
Lucency extends through the intertrochanteric RIGHT femur,
suspicious for an acute intertrochanteric femur fracture. Osteopenia
is present. Severe RIGHT hip osteoarthritis is present. Given the
history, the deformity in the RIGHT femoral neck could be chronic.
Noncontrast CT should be considered for further assessment in this
patient with acute on chronic injury.

The obturator rings appear intact.
IMPRESSION: Deformity of the proximal RIGHT femur is suspicious for an acute
intertrochanteric femur fracture however given the underlying healed
fracture, a noncontrast CT of the RIGHT hip is recommended for
further assessment.

## 2016-05-27 IMAGING — XA DG C-ARM 61-120 MIN-NO REPORT
4 series · 4 of 4 positions shown · non-contrast
Comparison: CT right hip region August 30, 2015

FLUOROSCOPY TIME:  0 minutes 52 seconds; 4 submitted images

CLINICAL DATA: Open reduction internal fixation for fracture

EXAM:
RIGHT FEMUR 2 VIEWS; DG C-ARM 61-120 MIN-NO REPORT

[Series 18: cont. · 1 of 1 slices shown (1 of 4)]
[im 1/1]
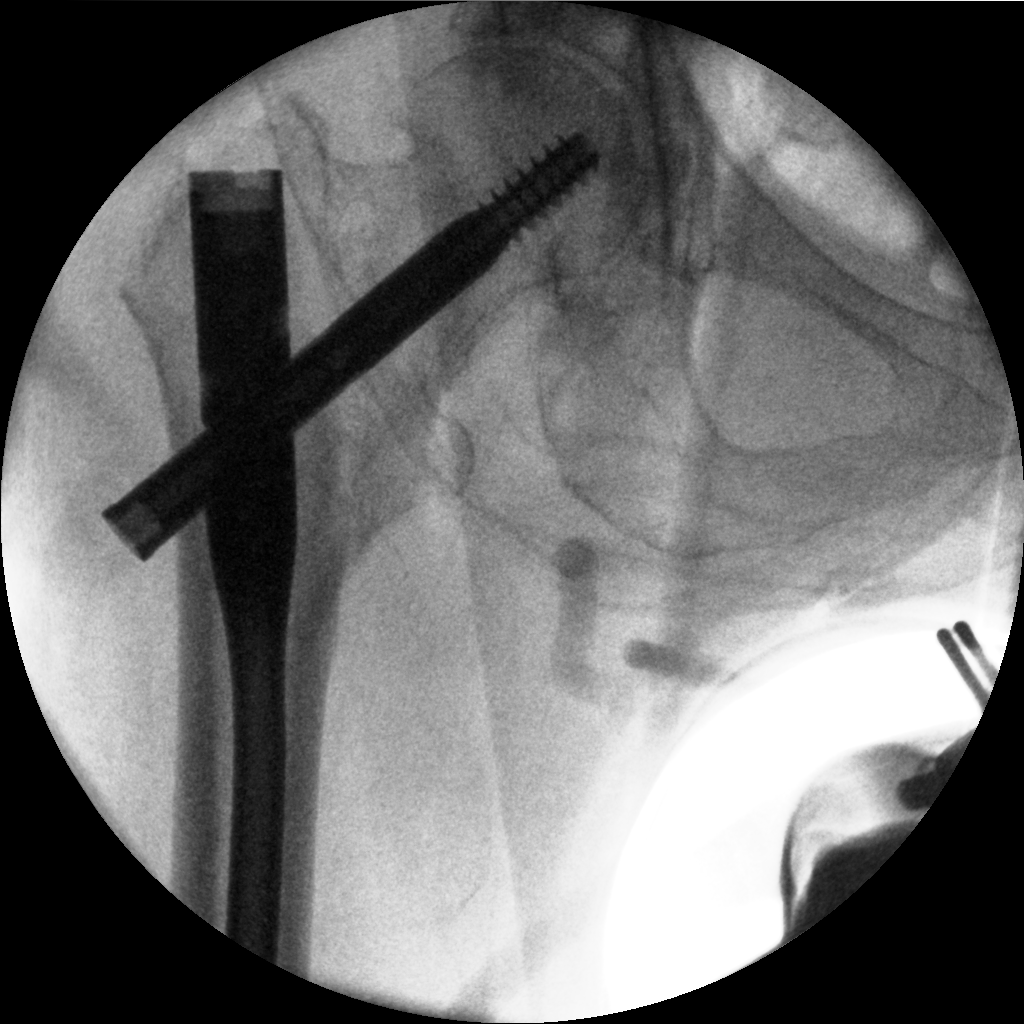

[Series 19: cont. · 1 of 1 slices shown (2 of 4)]
[im 1/1]
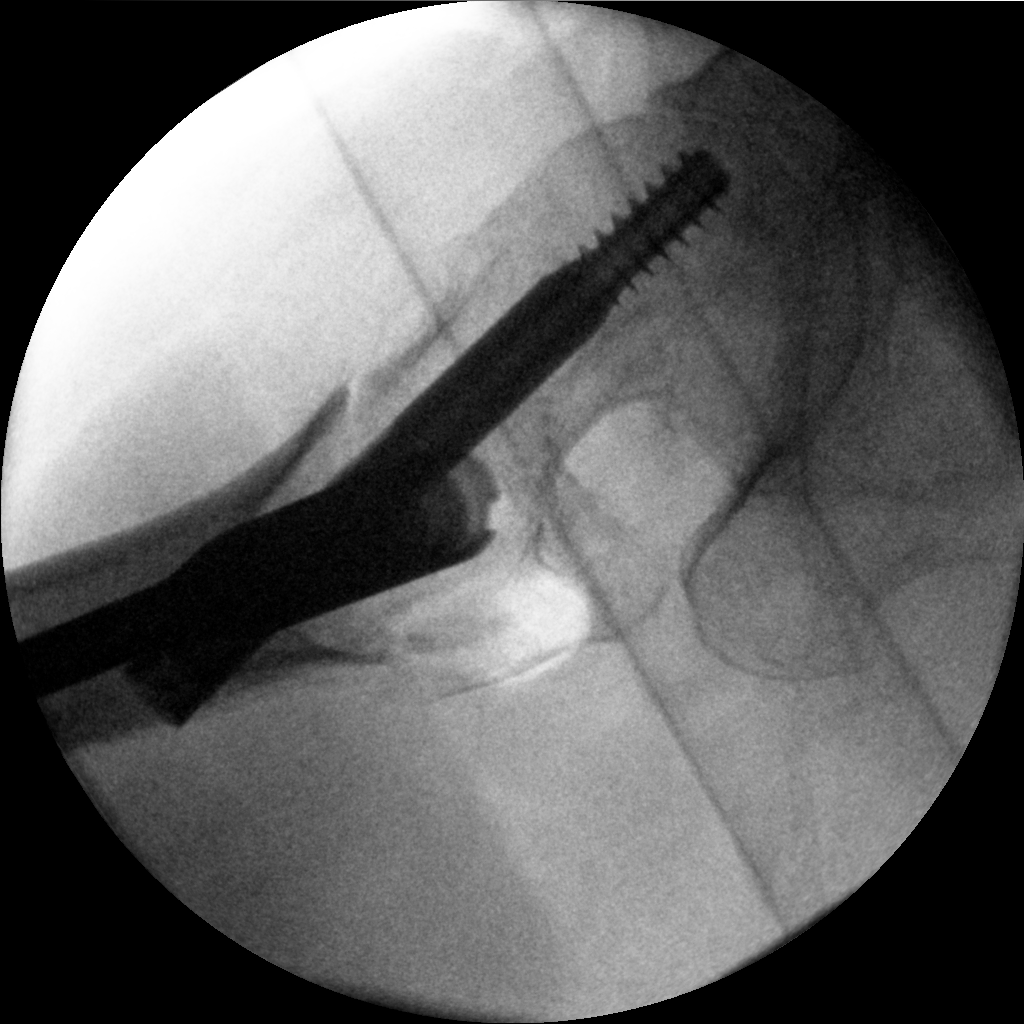

[Series 21: cont. · 1 of 1 slices shown (3 of 4)]
[im 1/1]
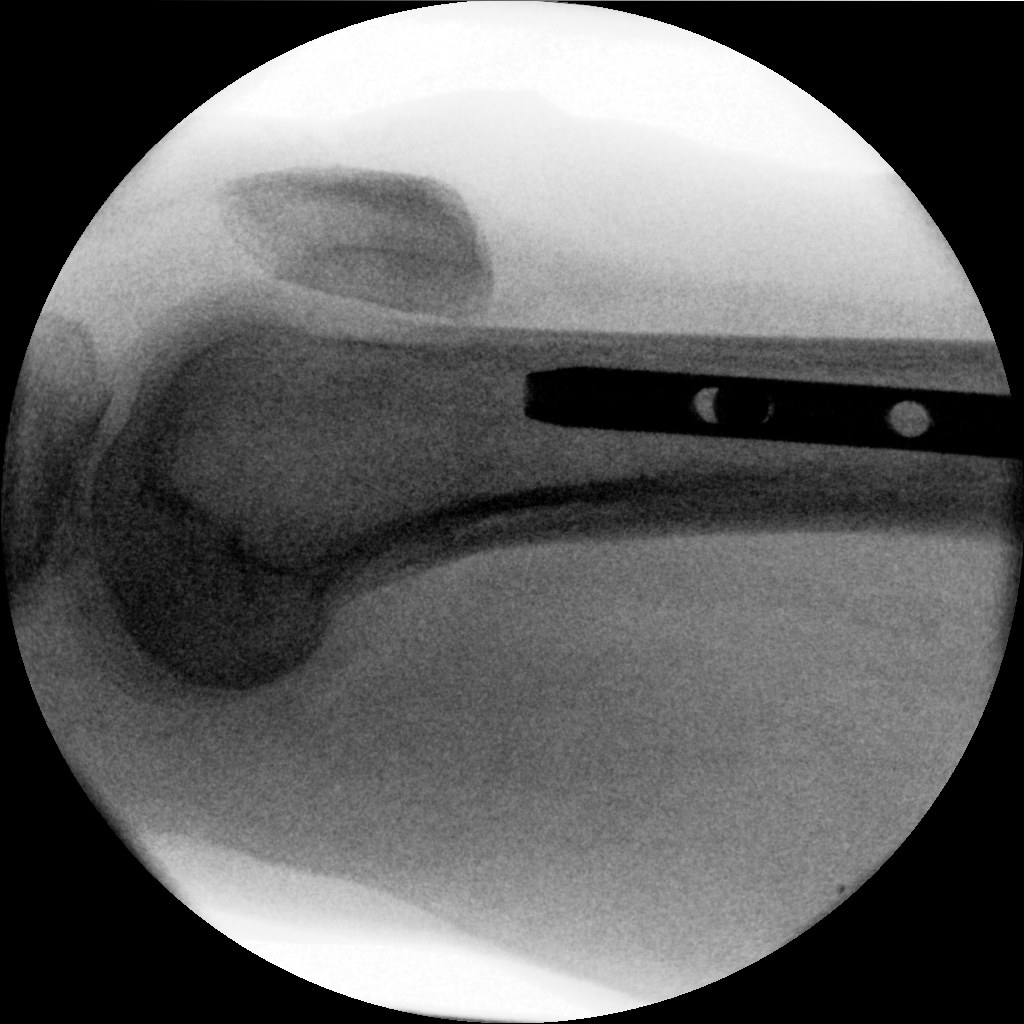

[Series 23: cont. · 1 of 1 slices shown (4 of 4)]
[im 1/1]
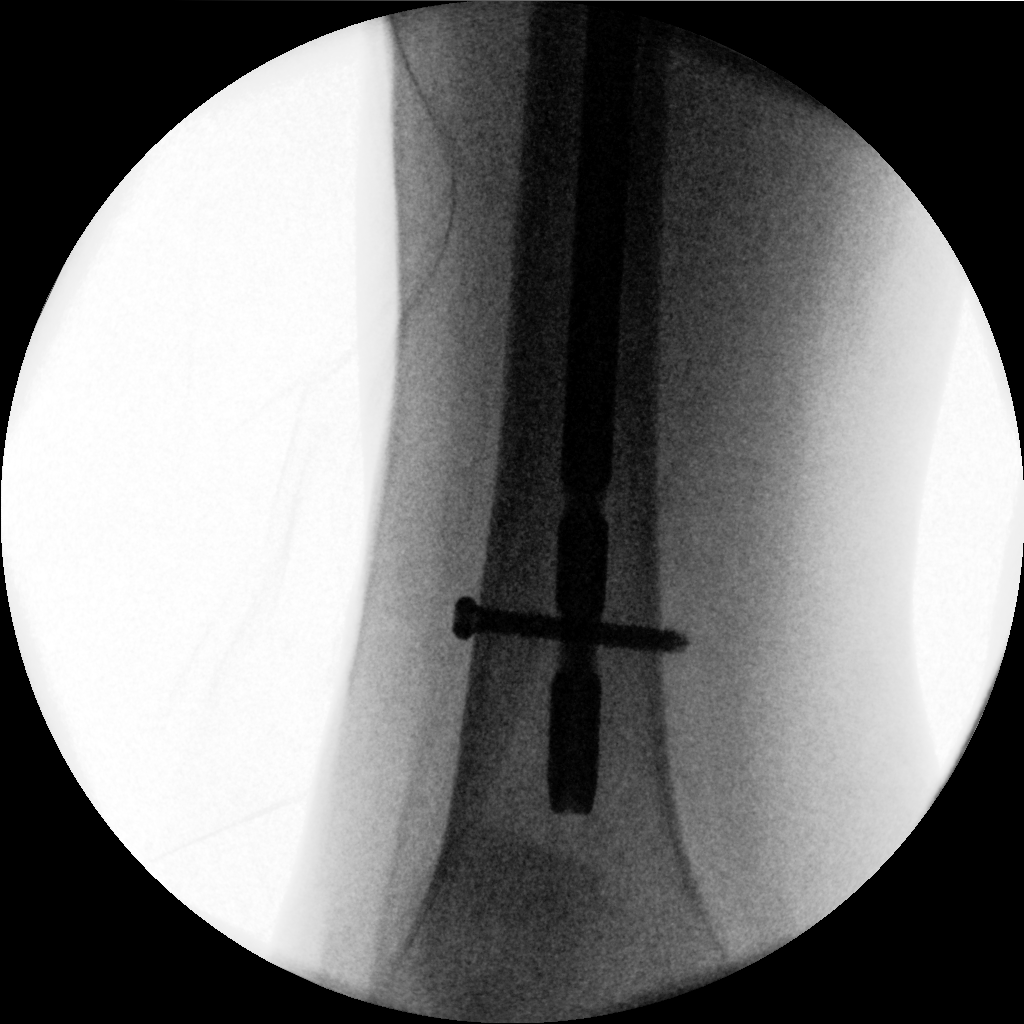

[4 of 4 positions shown; findings below may reference images not displayed]

FINDINGS: There is screw and rod fixation through a comminuted
intertrochanteric femur fracture. Alignment is near anatomic at the
fracture site. The tip of the screws in the proximal femoral head
region. No dislocation. No erosive change.
IMPRESSION: Alignment at the intertrochanteric femur fracture site is near
anatomic. No new fracture. No dislocation.

## 2016-05-27 IMAGING — CR DG FEMUR 2+V PORT*R*
1 series · 4 of 4 positions shown · non-contrast
Comparison: August 31, 2015

CLINICAL DATA: Hip fracture requiring operative repair

EXAM:
RIGHT FEMUR PORTABLE 1 VIEW

[Series 1: ap · 0.17mm/px · 4 of 4 slices shown]
[im 1/4]
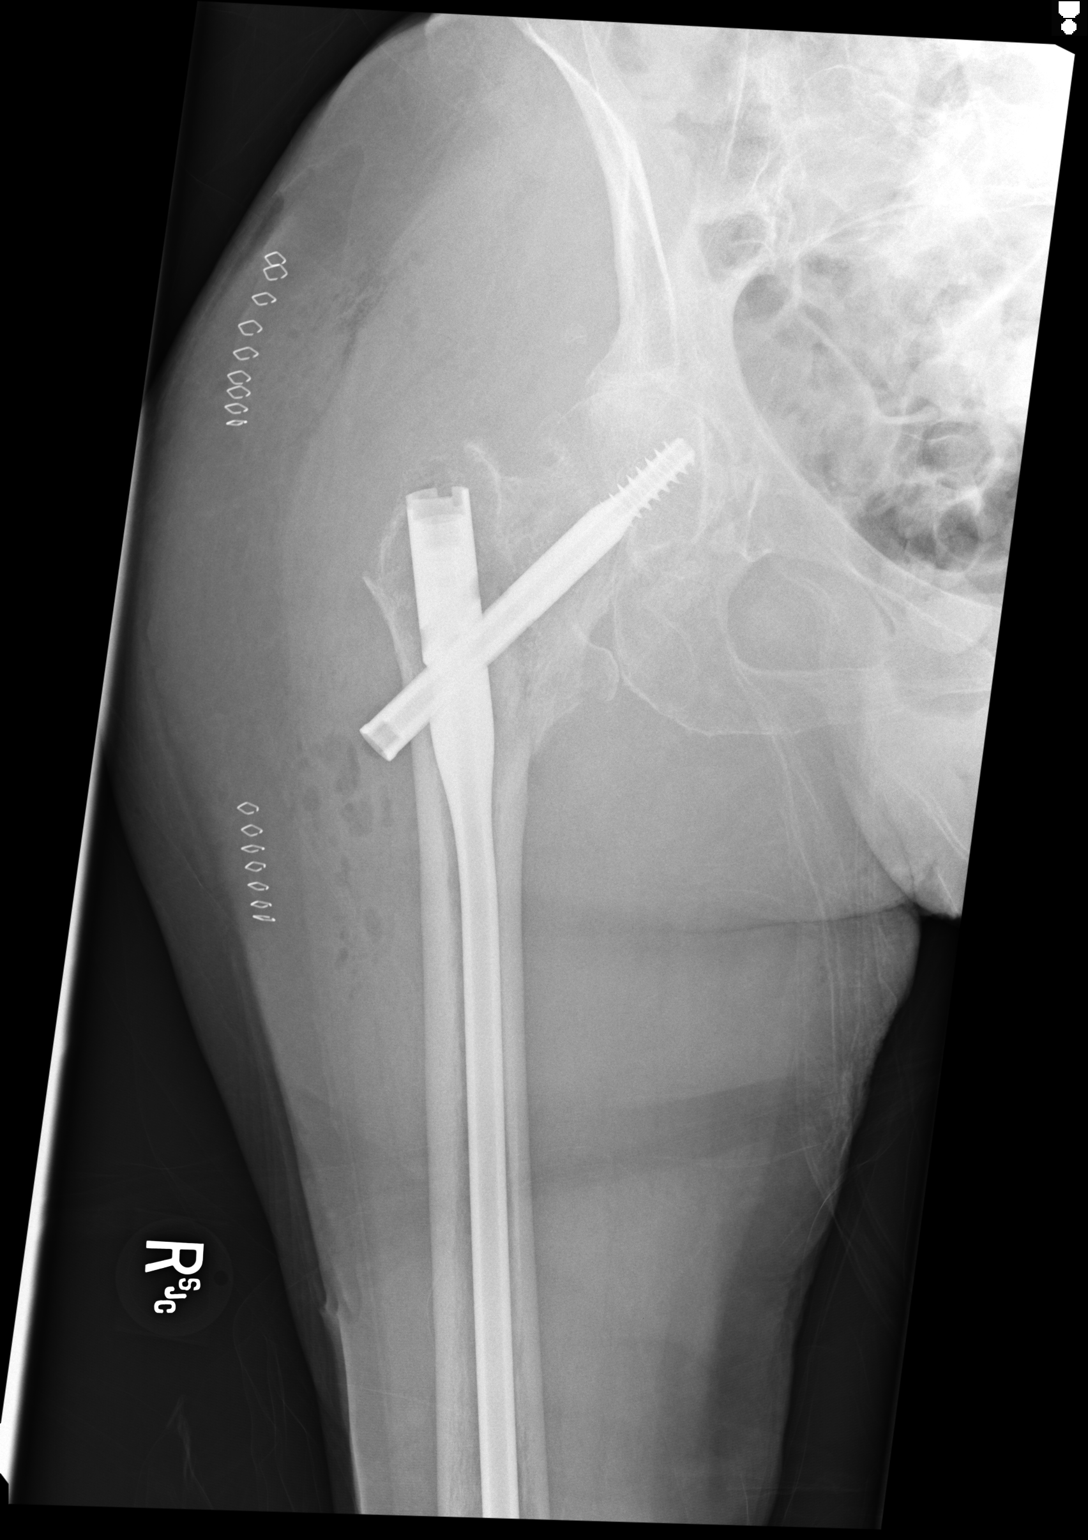
[im 2/4]
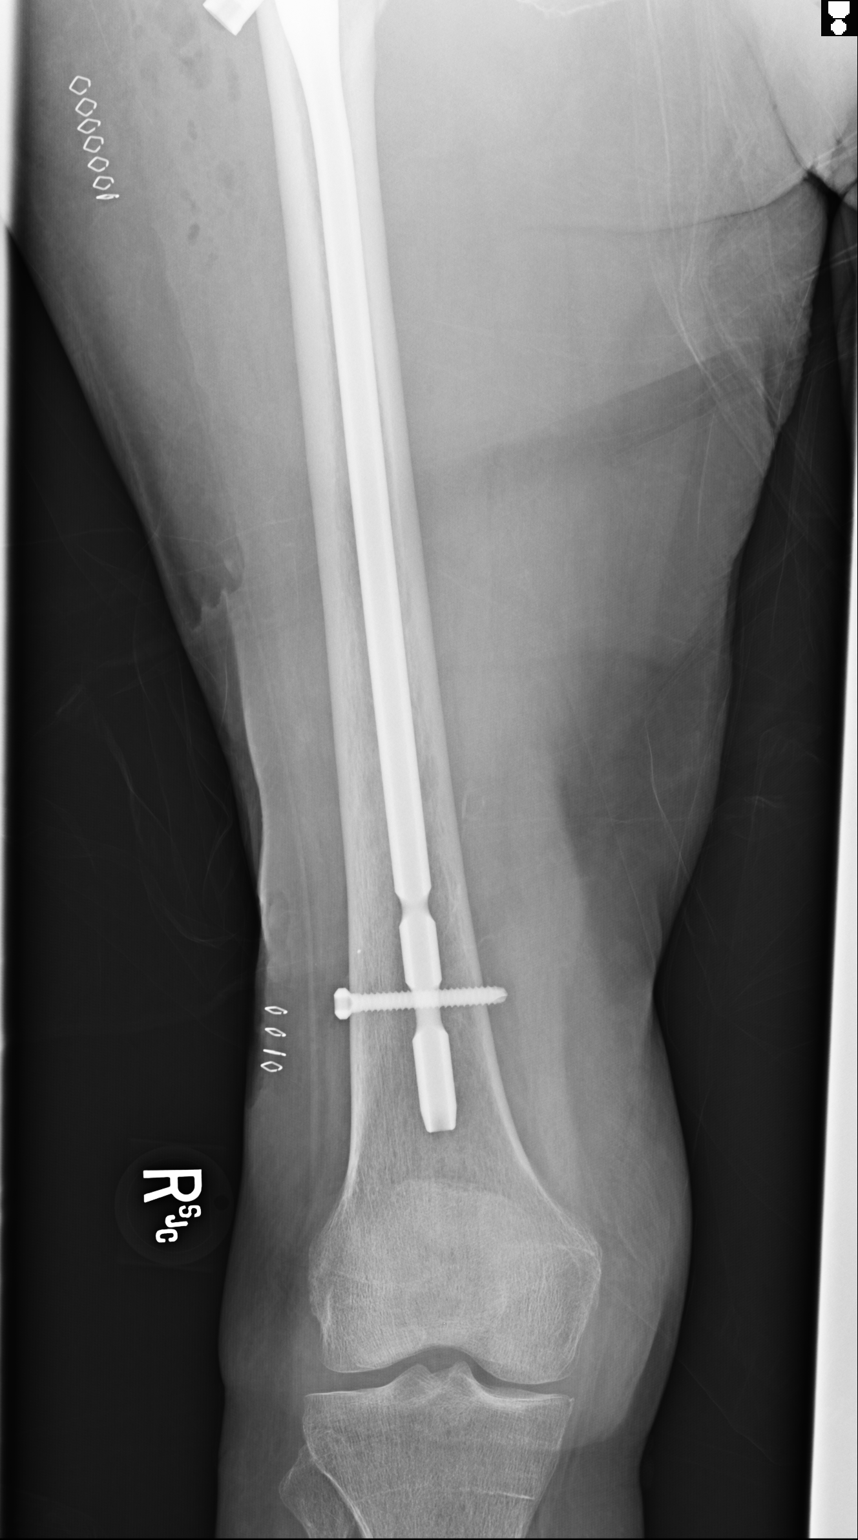
[im 3/4]
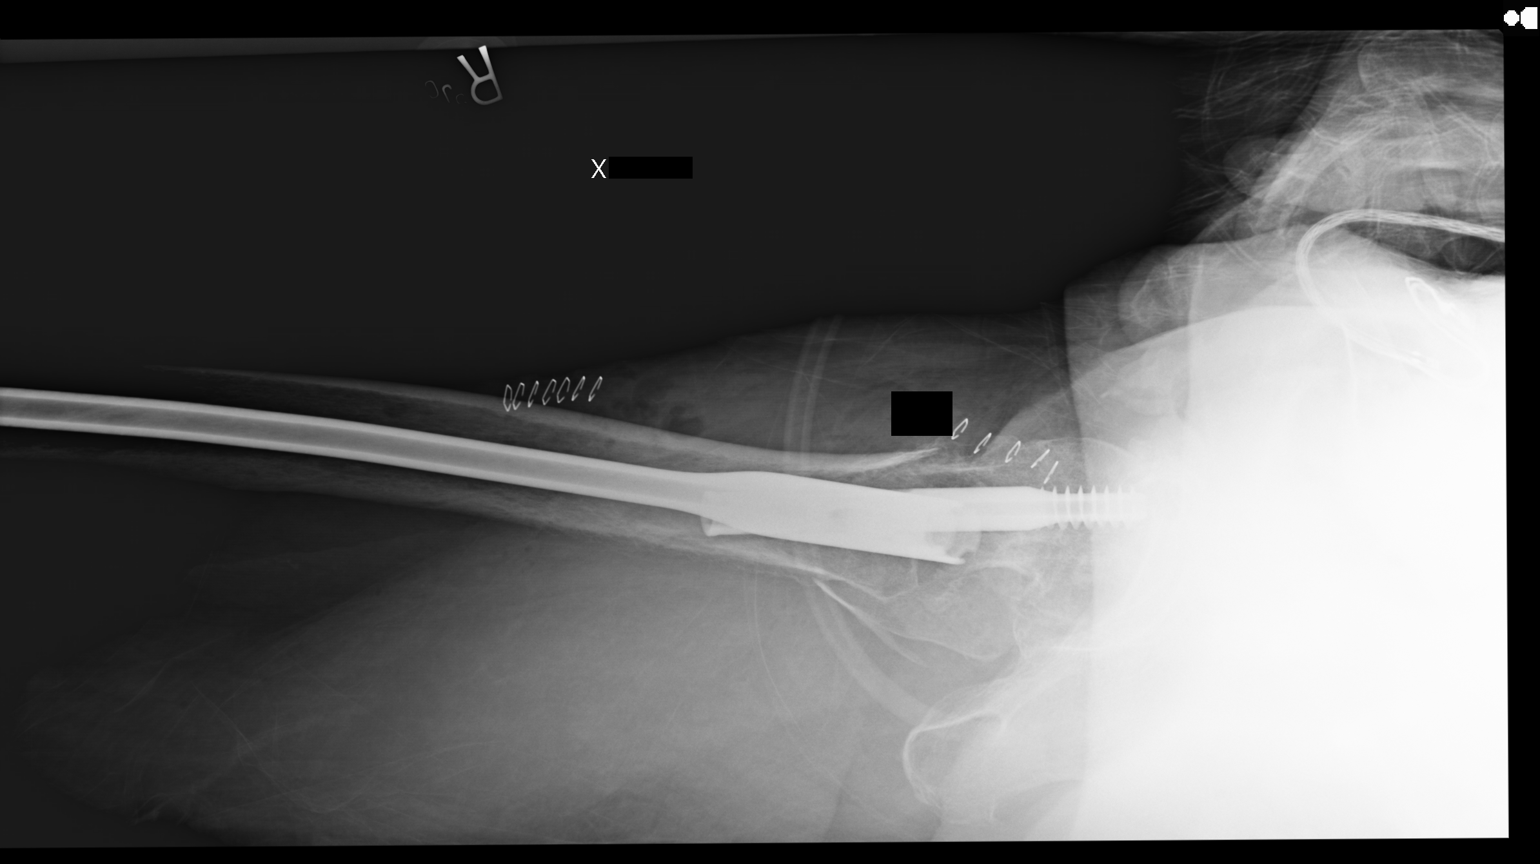
[im 4/4]
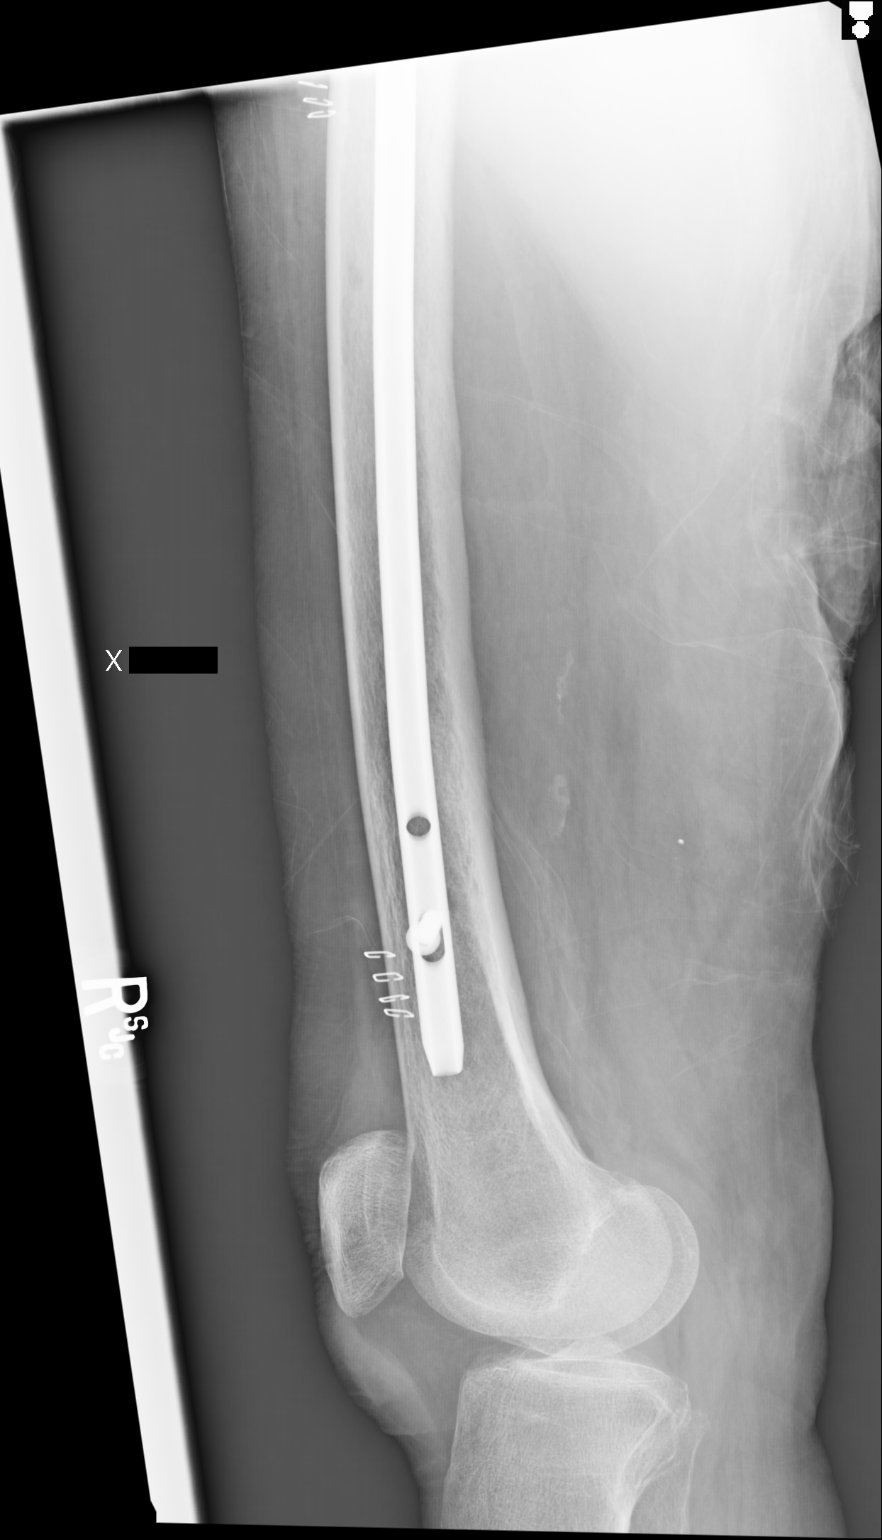

[4 of 4 positions shown; findings below may reference images not displayed]

FINDINGS: A right femoral rod is identified through intertrochanteric fracture
without malalignment. Postoperative changes with soft tissue air and
skin staples are identified in the right hip.
IMPRESSION: Postoperative changes as described.

## 2017-08-13 ENCOUNTER — Encounter: Payer: Self-pay | Admitting: Nephrology
